# Patient Record
Sex: Female | Born: 1960 | Race: Black or African American | Hispanic: No | Smoking: Never smoker
Health system: Southern US, Community
[De-identification: ages and names within clinical notes are randomized; demographics above are authoritative.]

## PROBLEM LIST (undated history)

## (undated) DIAGNOSIS — A5901 Trichomonal vulvovaginitis: Secondary | ICD-10-CM

## (undated) DIAGNOSIS — R51 Headache: Secondary | ICD-10-CM

## (undated) DIAGNOSIS — Z8042 Family history of malignant neoplasm of prostate: Secondary | ICD-10-CM

## (undated) DIAGNOSIS — F3181 Bipolar II disorder: Secondary | ICD-10-CM

## (undated) DIAGNOSIS — I10 Essential (primary) hypertension: Secondary | ICD-10-CM

## (undated) DIAGNOSIS — M1710 Unilateral primary osteoarthritis, unspecified knee: Secondary | ICD-10-CM

## (undated) DIAGNOSIS — I214 Non-ST elevation (NSTEMI) myocardial infarction: Secondary | ICD-10-CM

## (undated) DIAGNOSIS — E669 Obesity, unspecified: Secondary | ICD-10-CM

## (undated) DIAGNOSIS — G4700 Insomnia, unspecified: Secondary | ICD-10-CM

## (undated) DIAGNOSIS — I499 Cardiac arrhythmia, unspecified: Secondary | ICD-10-CM

## (undated) DIAGNOSIS — E785 Hyperlipidemia, unspecified: Secondary | ICD-10-CM

## (undated) DIAGNOSIS — M722 Plantar fascial fibromatosis: Secondary | ICD-10-CM

## (undated) DIAGNOSIS — Z803 Family history of malignant neoplasm of breast: Secondary | ICD-10-CM

## (undated) DIAGNOSIS — G43909 Migraine, unspecified, not intractable, without status migrainosus: Secondary | ICD-10-CM

## (undated) DIAGNOSIS — F429 Obsessive-compulsive disorder, unspecified: Secondary | ICD-10-CM

## (undated) DIAGNOSIS — I712 Thoracic aortic aneurysm, without rupture: Secondary | ICD-10-CM

## (undated) DIAGNOSIS — F4310 Post-traumatic stress disorder, unspecified: Secondary | ICD-10-CM

## (undated) DIAGNOSIS — N83202 Unspecified ovarian cyst, left side: Secondary | ICD-10-CM

## (undated) DIAGNOSIS — D649 Anemia, unspecified: Secondary | ICD-10-CM

## (undated) DIAGNOSIS — I5181 Takotsubo syndrome: Secondary | ICD-10-CM

## (undated) DIAGNOSIS — F419 Anxiety disorder, unspecified: Secondary | ICD-10-CM

## (undated) HISTORY — DX: Obesity, unspecified: E66.9

## (undated) HISTORY — DX: Morbid (severe) obesity due to excess calories: E66.01

## (undated) HISTORY — DX: Anxiety disorder, unspecified: F41.9

## (undated) HISTORY — DX: Post-traumatic stress disorder, unspecified: F43.10

## (undated) HISTORY — DX: Trichomonal vulvovaginitis: A59.01

## (undated) HISTORY — DX: Family history of malignant neoplasm of breast: Z80.3

## (undated) HISTORY — DX: Plantar fascial fibromatosis: M72.2

## (undated) HISTORY — PX: FOOT SURGERY: SHX648

## (undated) HISTORY — DX: Obsessive-compulsive disorder, unspecified: F42.9

## (undated) HISTORY — DX: Family history of malignant neoplasm of prostate: Z80.42

## (undated) HISTORY — DX: Hyperlipidemia, unspecified: E78.5

## (undated) HISTORY — DX: Headache: R51

## (undated) HISTORY — DX: Insomnia, unspecified: G47.00

## (undated) HISTORY — DX: Bipolar II disorder: F31.81

## (undated) HISTORY — DX: Unilateral primary osteoarthritis, unspecified knee: M17.10

---

## 1987-03-26 HISTORY — PX: TUBAL LIGATION: SHX77

## 2008-10-28 ENCOUNTER — Other Ambulatory Visit: Admission: RE | Admit: 2008-10-28 | Discharge: 2008-10-28 | Payer: Self-pay | Admitting: Family Medicine

## 2009-01-13 ENCOUNTER — Ambulatory Visit (HOSPITAL_COMMUNITY): Payer: Self-pay | Admitting: Psychology

## 2009-02-01 ENCOUNTER — Inpatient Hospital Stay (HOSPITAL_COMMUNITY): Admission: AD | Admit: 2009-02-01 | Discharge: 2009-02-02 | Payer: Self-pay | Admitting: Psychiatry

## 2009-02-01 ENCOUNTER — Ambulatory Visit: Payer: Self-pay | Admitting: Psychiatry

## 2009-11-24 ENCOUNTER — Other Ambulatory Visit: Admission: RE | Admit: 2009-11-24 | Discharge: 2009-11-24 | Payer: Self-pay | Admitting: Family Medicine

## 2010-06-27 LAB — COMPREHENSIVE METABOLIC PANEL
Alkaline Phosphatase: 58 U/L (ref 39–117)
BUN: 9 mg/dL (ref 6–23)
Glucose, Bld: 101 mg/dL — ABNORMAL HIGH (ref 70–99)
Potassium: 2.8 mEq/L — ABNORMAL LOW (ref 3.5–5.1)
Total Bilirubin: 0.6 mg/dL (ref 0.3–1.2)
Total Protein: 7 g/dL (ref 6.0–8.3)

## 2010-06-27 LAB — CBC
HCT: 34.8 % — ABNORMAL LOW (ref 36.0–46.0)
Hemoglobin: 11.8 g/dL — ABNORMAL LOW (ref 12.0–15.0)
MCHC: 33.8 g/dL (ref 30.0–36.0)
RDW: 13.5 % (ref 11.5–15.5)

## 2010-06-27 LAB — TSH: TSH: 3.953 u[IU]/mL (ref 0.350–4.500)

## 2010-09-19 ENCOUNTER — Telehealth: Payer: Self-pay | Admitting: *Deleted

## 2010-09-19 NOTE — Telephone Encounter (Signed)
Error/ not correct dob

## 2011-05-24 HISTORY — PX: BRACHIOPLASTY: SUR162

## 2011-12-19 ENCOUNTER — Emergency Department (HOSPITAL_COMMUNITY): Payer: BC Managed Care – PPO

## 2011-12-19 ENCOUNTER — Emergency Department (HOSPITAL_COMMUNITY)
Admission: EM | Admit: 2011-12-19 | Discharge: 2011-12-19 | Disposition: A | Discharge status: Home / Self Care | Payer: BC Managed Care – PPO | Attending: Emergency Medicine | Admitting: Emergency Medicine

## 2011-12-19 ENCOUNTER — Encounter (HOSPITAL_COMMUNITY): Payer: Self-pay | Admitting: Emergency Medicine

## 2011-12-19 ENCOUNTER — Other Ambulatory Visit: Payer: Self-pay

## 2011-12-19 DIAGNOSIS — I10 Essential (primary) hypertension: Secondary | ICD-10-CM | POA: Insufficient documentation

## 2011-12-19 DIAGNOSIS — G43109 Migraine with aura, not intractable, without status migrainosus: Secondary | ICD-10-CM

## 2011-12-19 HISTORY — DX: Essential (primary) hypertension: I10

## 2011-12-19 LAB — CBC
HCT: 36.7 % (ref 36.0–46.0)
Platelets: 275 10*3/uL (ref 150–400)
RDW: 13.3 % (ref 11.5–15.5)
WBC: 4.8 10*3/uL (ref 4.0–10.5)

## 2011-12-19 LAB — PROTIME-INR
INR: 1 (ref 0.00–1.49)
Prothrombin Time: 13.1 seconds (ref 11.6–15.2)

## 2011-12-19 LAB — BASIC METABOLIC PANEL
Chloride: 100 mEq/L (ref 96–112)
GFR calc Af Amer: >90 mL/min (ref >90)
Potassium: 3 mEq/L — ABNORMAL LOW (ref 3.5–5.1)
Sodium: 134 mEq/L — ABNORMAL LOW (ref 135–145)

## 2011-12-19 MED ORDER — METOCLOPRAMIDE HCL 5 MG/ML IJ SOLN
10.0000 mg | Freq: Once | INTRAMUSCULAR | Status: AC
  Administered 2011-12-19: 10 mg via INTRAVENOUS
  Filled 2011-12-19: qty 2

## 2011-12-19 MED ORDER — HYDROCODONE-ACETAMINOPHEN 5-500 MG PO TABS: 1.0000 | ORAL_TABLET | Freq: Four times a day (QID) | ORAL | Status: DC | PRN

## 2011-12-19 MED ORDER — LORAZEPAM 2 MG/ML IJ SOLN
INTRAMUSCULAR | Status: AC
  Administered 2011-12-19: 2 mg via INTRAVENOUS
  Filled 2011-12-19: qty 1

## 2011-12-19 MED ORDER — ASPIRIN 81 MG PO CHEW: 81.0000 mg | CHEWABLE_TABLET | Freq: Every day | ORAL | Status: DC

## 2011-12-19 MED ORDER — METOCLOPRAMIDE HCL 5 MG/ML IJ SOLN
10.0000 mg | Freq: Once | INTRAMUSCULAR | Status: AC
  Administered 2011-12-19: 10 mg via INTRAVENOUS
  Filled 2011-12-19 (×2): qty 2

## 2011-12-19 MED ORDER — LORAZEPAM 2 MG/ML IJ SOLN
2.0000 mg | Freq: Once | INTRAMUSCULAR | Status: AC
  Administered 2011-12-19: 2 mg via INTRAVENOUS

## 2011-12-19 MED ORDER — HYDROMORPHONE HCL PF 1 MG/ML IJ SOLN
0.5000 mg | Freq: Once | INTRAMUSCULAR | Status: AC
  Administered 2011-12-19: 0.5 mg via INTRAVENOUS
  Filled 2011-12-19: qty 1

## 2011-12-19 NOTE — ED Notes (Signed)
Pt has no signs of respiratory distress, vital sign is stable. Has headache in the past but never had neurological deficit associated with it.

## 2011-12-19 NOTE — ED Notes (Signed)
Pt in MRI, MRI tech said pt was in panic attack, and crawling out of bed and how she never saw anybody like her before. Dr. Patria Mane aware, 2 mg ativan given.

## 2011-12-19 NOTE — ED Notes (Signed)
Pt has headache x 1 week, woke up feeling numb on left face and left side of body, couldn't talk as well. Pt speech is somewhat clear, symmetrical smile, Left arm is weaker with drift, Left leg is weaker with drift as well. Pt unable to distinguish sharp from dull (sts feels like something is tapping her but feels the same on bilateral legs and arms).

## 2011-12-19 NOTE — ED Notes (Signed)
Pt to CT

## 2011-12-19 NOTE — ED Notes (Signed)
Pt still in MRI 

## 2011-12-19 NOTE — ED Provider Notes (Signed)
History     CSN: [id number]  Arrival date & time 12/19/11  7829   First MD Initiated Contact with Patient 12/19/11 0930      Chief Complaint  Patient presents with  . Weakness     The history is provided by the patient and a relative.   patient reports a history of headaches and reports severe headache over the past week.  She seen her physician and was placed on prednisone and Vicodin but reports that her headache worsen.  She awoke this morning with left-sided facial numbness and slurred speech per the daughter at the bedside.  She also reported weakness of her bilateral lower extremities and generalized weakness with some weakness of her left upper extremity as well.  She has a history of stroke.  She does not smoke cigarettes.  She has a history of hypertension.  She denies diabetes and hyperlipidemia.  She's no chest pain or shortness of breath.  She's had no fevers or chills.  She has no neck stiffness.  Past Medical History  Diagnosis Date  . Hypertension     Past Surgical History  Procedure Date  . Arm sugery right    No family history on file.  History  Substance Use Topics  . Smoking status: Never Smoker   . Smokeless tobacco: Not on file  . Alcohol Use: No    OB History    Grav Para Term Preterm Abortions TAB SAB Ect Mult Living                  Review of Systems  Neurological: Positive for weakness.  All other systems reviewed and are negative.    Allergies  Review of patient's allergies indicates no known allergies.  Home Medications   Current Outpatient Rx  Name Route Sig Dispense Refill  . ESZOPICLONE 3 MG PO TABS Oral Take 3 mg by mouth at bedtime as needed. Take immediately before bedtime; for sleep.    Marland Kitchen HYDROCHLOROTHIAZIDE 25 MG PO TABS Oral Take 25 mg by mouth daily.    Marland Kitchen HYDROCODONE-ACETAMINOPHEN 5-500 MG PO TABS Oral Take 1-2 tablets by mouth every 6 (six) hours as needed. For pain.    Marland Kitchen PREDNISONE 20 MG PO TABS Oral Take 40 mg by mouth  daily.      BP 139/92  Pulse 65  Temp 98.8 F (37.1 C) (Oral)  Resp 17  SpO2 100%  Physical Exam  Nursing note and vitals reviewed. Constitutional: She appears well-developed and well-nourished. No distress.       Her eyes are clinched shut and she will not open them due to severity of photophobia  HENT:  Head: Normocephalic and atraumatic.  Eyes: Right eye exhibits no discharge. Left eye exhibits no discharge.  Neck: Normal range of motion. No rigidity.  Cardiovascular: Normal rate, regular rhythm and normal heart sounds.   Pulmonary/Chest: Effort normal and breath sounds normal.  Abdominal: Soft. She exhibits no distension. There is no tenderness.  Musculoskeletal: Normal range of motion.  Neurological: She is alert.       Patient with 5 out of 5 strength in right upper extremity.  Patient with 4-5 strength in left upper extremity.  Patient with 4/5 strength in bilateral lower extremities.  Her effort with her lower extremities is very poor and it seems as though she forcibly pushes her legs down towards the bed.  Her effort with her left upper extremity is intermittent and it seems that her strength waxes and wanes.  Skin: Skin is warm and dry.  Psychiatric: She has a normal mood and affect. Judgment normal.    ED Course  Procedures (including critical care time)  Labs Reviewed  BASIC METABOLIC PANEL - Abnormal; Notable for the following:    Sodium 134 (*)     Potassium 3.0 (*)     GFR calc non Af Amer 83 (*)     All other components within normal limits  CBC  PROTIME-INR   Ct Head Wo Contrast  12/19/2011  *RADIOLOGY REPORT*  Clinical Data:  Left-sided weakness.  Headache.  CT HEAD WITHOUT CONTRAST  Technique: Contiguous axial images were obtained from the base of the skull through the vertex without contrast  Comparison: None  Findings:  There is no evidence of intracranial hemorrhage, brain edema, or other signs of acute infarction.  There is no evidence of intracranial  mass lesion or mass effect.  No abnormal extraaxial fluid collections are identified.  There is no evidence of hydrocephalus, or other significant intracranial abnormality.  No skull abnormality identified.  IMPRESSION: Negative non-contrast head CT.   Original Report Authenticated By: Danae Orleans, M.D.    Mr Brain Wo Contrast  12/19/2011  *RADIOLOGY REPORT*  Clinical Data: Severe migraine headache which is improving.  Left- sided weakness.  MRI HEAD WITHOUT CONTRAST  Technique:  Multiplanar, multiecho pulse sequences of the brain and surrounding structures were obtained according to standard protocol without intravenous contrast.  Comparison: CT head earlier in the day  Findings: Subtle areas of focal linear and punctate restricted diffusion in the right frontal premotor cortex (images 23 - 26, series 4) display corresponding decreased signal on ADC mapping. No correlated abnormality on FLAIR or T2-weighted images.  No subarachnoid blood, hemorrhage, or mass lesion.  The findings are consistent with sequela of complicated migraine, which in all likelihood is reversible given the lack of prolonged T2 signal on other sequences.  Short-term follow-up is recommended, with pre and postcontrast imaging.  No acute cortically based infarct.  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  No midline shift.  Major intracranial vascular structures patent.  Slight prolonged DWI signal in the scalp overlying the right frontal region of uncertain significance; correlate with localized tenderness.  Normal pituitary and cerebellar tonsils.  Negative orbits, sinuses, mastoids.  IMPRESSION: Low level right hemisphere restricted diffusion could represent sequelae of complicated migraine.  Localized edema of the right frontal scalp may be a secondary sign.  The signal intensity on DWI sequence does not appear sufficiently intense to suggest acute infarction.  Recommend short term follow up as described above.   No evidence for subarachnoid blood, intra-axial mass lesion, or proximal vascular occlusion.   Original Report Authenticated By: Elsie Stain, M.D.     Date: 12/19/2011  Rate: 74  Rhythm: normal sinus rhythm  QRS Axis: normal  Intervals: normal  ST/T Wave abnormalities: normal  Conduction Disutrbances: none  Narrative Interpretation:   Old EKG Reviewed: No significant changes noted   I personally reviewed the imaging tests through PACS system I reviewed available ER/hospitalization records thought the EMR i discussed the MR with radiology   1. Complicated migraine       MDM  I believe this is a complicated migraine.  The patient received treatment for her headache at this time.  CT scan of her head pending to evaluate for mass/bleed.  As she awoke with her symptoms she would not be a candidate for TPA regardless of her presenting complaints.  However I believe that her effort is poor.   3:00 PM Feels much better. Weakness has nearly resolved. Able to ambulate to the restroom. Dc home. Will place on an 81 mg ASA. Neuro follow up        Lyanne Co, MD 12/19/11 1501

## 2011-12-19 NOTE — ED Notes (Signed)
Pt returned from MRI °

## 2011-12-19 NOTE — ED Notes (Signed)
Dr. Patria Mane informed of pt's condition and is in room evaluating pt.

## 2011-12-19 NOTE — ED Notes (Signed)
Pt presenting to ed with c/o waking up this morning with left sided facial numbness and slurred speech per daughter at bedside. Per daughter pt has had a headache x 1 week and her doctor put her on prednisone and vicodin and she has been feeling funny since pt's daughter states she went back to her doctor yesterday but woke up with worse symptoms this morning.

## 2011-12-21 ENCOUNTER — Inpatient Hospital Stay (HOSPITAL_COMMUNITY)
Admission: EM | Admit: 2011-12-21 | Discharge: 2011-12-24 | DRG: 024 | Disposition: A | Discharge status: Home / Self Care | Payer: BC Managed Care – PPO | Attending: Internal Medicine | Admitting: Internal Medicine

## 2011-12-21 ENCOUNTER — Encounter (HOSPITAL_COMMUNITY): Payer: Self-pay

## 2011-12-21 DIAGNOSIS — I10 Essential (primary) hypertension: Secondary | ICD-10-CM | POA: Diagnosis present

## 2011-12-21 DIAGNOSIS — E876 Hypokalemia: Secondary | ICD-10-CM | POA: Diagnosis present

## 2011-12-21 DIAGNOSIS — M6281 Muscle weakness (generalized): Secondary | ICD-10-CM

## 2011-12-21 DIAGNOSIS — R112 Nausea with vomiting, unspecified: Secondary | ICD-10-CM

## 2011-12-21 DIAGNOSIS — G8194 Hemiplegia, unspecified affecting left nondominant side: Secondary | ICD-10-CM

## 2011-12-21 DIAGNOSIS — Z7982 Long term (current) use of aspirin: Secondary | ICD-10-CM

## 2011-12-21 DIAGNOSIS — R51 Headache: Secondary | ICD-10-CM

## 2011-12-21 DIAGNOSIS — G8190 Hemiplegia, unspecified affecting unspecified side: Secondary | ICD-10-CM | POA: Diagnosis present

## 2011-12-21 DIAGNOSIS — Z79899 Other long term (current) drug therapy: Secondary | ICD-10-CM

## 2011-12-21 DIAGNOSIS — R531 Weakness: Secondary | ICD-10-CM

## 2011-12-21 DIAGNOSIS — R519 Headache, unspecified: Secondary | ICD-10-CM | POA: Diagnosis present

## 2011-12-21 DIAGNOSIS — G43109 Migraine with aura, not intractable, without status migrainosus: Principal | ICD-10-CM

## 2011-12-21 LAB — MAGNESIUM: Magnesium: 1.9 mg/dL (ref 1.5–2.5)

## 2011-12-21 MED ORDER — DEXAMETHASONE SODIUM PHOSPHATE 10 MG/ML IJ SOLN
10.0000 mg | Freq: Once | INTRAMUSCULAR | Status: AC
  Administered 2011-12-21: 10 mg via INTRAVENOUS
  Filled 2011-12-21: qty 1

## 2011-12-21 MED ORDER — SODIUM CHLORIDE 0.9 % IV BOLUS (SEPSIS)
1000.0000 mL | Freq: Once | INTRAVENOUS | Status: AC
  Administered 2011-12-21: 1000 mL via INTRAVENOUS

## 2011-12-21 MED ORDER — VALPROIC ACID 250 MG PO CAPS
500.0000 mg | ORAL_CAPSULE | Freq: Once | ORAL | Status: AC
  Administered 2011-12-21: 500 mg via ORAL
  Filled 2011-12-21: qty 2

## 2011-12-21 MED ORDER — METOCLOPRAMIDE HCL 5 MG/ML IJ SOLN
10.0000 mg | Freq: Once | INTRAMUSCULAR | Status: AC
  Administered 2011-12-21: 10 mg via INTRAVENOUS
  Filled 2011-12-21: qty 2

## 2011-12-21 MED ORDER — OXYCODONE HCL 5 MG PO TABS
5.0000 mg | ORAL_TABLET | ORAL | Status: DC | PRN
  Administered 2011-12-22 – 2011-12-24 (×7): 5 mg via ORAL
  Filled 2011-12-21 (×7): qty 1

## 2011-12-21 MED ORDER — ZOLPIDEM TARTRATE 5 MG PO TABS: 5.0000 mg | ORAL_TABLET | Freq: Every evening | ORAL | Status: DC | PRN

## 2011-12-21 MED ORDER — POTASSIUM CHLORIDE CRYS ER 20 MEQ PO TBCR
40.0000 meq | EXTENDED_RELEASE_TABLET | Freq: Once | ORAL | Status: AC
  Administered 2011-12-22: 40 meq via ORAL
  Filled 2011-12-21: qty 2

## 2011-12-21 MED ORDER — ONDANSETRON HCL 4 MG/2ML IJ SOLN
4.0000 mg | Freq: Four times a day (QID) | INTRAMUSCULAR | Status: DC | PRN
  Administered 2011-12-22 – 2011-12-24 (×5): 4 mg via INTRAVENOUS
  Filled 2011-12-21 (×5): qty 2

## 2011-12-21 MED ORDER — ALUM & MAG HYDROXIDE-SIMETH 200-200-20 MG/5ML PO SUSP
30.0000 mL | Freq: Four times a day (QID) | ORAL | Status: DC | PRN
  Administered 2011-12-23: 30 mL via ORAL
  Filled 2011-12-21: qty 30

## 2011-12-21 MED ORDER — ACETAMINOPHEN 325 MG PO TABS: 650.0000 mg | ORAL_TABLET | Freq: Four times a day (QID) | ORAL | Status: DC | PRN

## 2011-12-21 MED ORDER — ONDANSETRON HCL 4 MG PO TABS
4.0000 mg | ORAL_TABLET | Freq: Four times a day (QID) | ORAL | Status: DC | PRN
  Filled 2011-12-21: qty 1

## 2011-12-21 MED ORDER — KETOROLAC TROMETHAMINE 30 MG/ML IJ SOLN
30.0000 mg | Freq: Once | INTRAMUSCULAR | Status: AC
  Administered 2011-12-21: 30 mg via INTRAVENOUS
  Filled 2011-12-21: qty 1

## 2011-12-21 MED ORDER — VALPROATE SODIUM 500 MG/5ML IV SOLN
500.0000 mg | INTRAVENOUS | Status: AC
  Administered 2011-12-22: 500 mg via INTRAVENOUS
  Filled 2011-12-21: qty 5

## 2011-12-21 MED ORDER — ACETAMINOPHEN 650 MG RE SUPP: 650.0000 mg | Freq: Four times a day (QID) | RECTAL | Status: DC | PRN

## 2011-12-21 MED ORDER — PROCHLORPERAZINE EDISYLATE 5 MG/ML IJ SOLN
10.0000 mg | Freq: Once | INTRAMUSCULAR | Status: AC
  Administered 2011-12-21: 10 mg via INTRAVENOUS
  Filled 2011-12-21: qty 2

## 2011-12-21 MED ORDER — HYDROMORPHONE HCL PF 1 MG/ML IJ SOLN
1.0000 mg | INTRAMUSCULAR | Status: AC
  Administered 2011-12-21: 1 mg via INTRAVENOUS
  Filled 2011-12-21: qty 1

## 2011-12-21 MED ORDER — POTASSIUM CHLORIDE IN NACL 20-0.9 MEQ/L-% IV SOLN
INTRAVENOUS | Status: DC
  Administered 2011-12-22 – 2011-12-23 (×2): via INTRAVENOUS
  Filled 2011-12-21 (×8): qty 1000

## 2011-12-21 MED ORDER — LIDOCAINE HCL (PF) 1 % IJ SOLN
INTRAMUSCULAR | Status: AC
  Filled 2011-12-21: qty 5

## 2011-12-21 MED ORDER — HYDROMORPHONE HCL PF 1 MG/ML IJ SOLN
0.5000 mg | INTRAMUSCULAR | Status: DC | PRN
  Administered 2011-12-22 – 2011-12-23 (×3): 1 mg via INTRAVENOUS
  Filled 2011-12-21 (×3): qty 1

## 2011-12-21 MED ORDER — DIPHENHYDRAMINE HCL 50 MG/ML IJ SOLN
25.0000 mg | Freq: Once | INTRAMUSCULAR | Status: AC
  Administered 2011-12-21: 25 mg via INTRAVENOUS
  Filled 2011-12-21: qty 1

## 2011-12-21 NOTE — ED Notes (Signed)
History of Migraine seen at [facility] two days ago for the same.

## 2011-12-21 NOTE — ED Provider Notes (Signed)
4:35 PM Patient moved to CDU holding for treatment of migraine.  Sign out received from Deborah Helper, PA-C.  Pt with headache ongoing since last week, seen in ED on 9/26 with MRI brain showing no acute changes but possible evidence for complicated migraine.  Pt also with left sided weakness on exam, per Deborah Williamson.  Deborah Williamson has spoken with Dr Deborah Williamson and the on call neurologist who recommend treatment with migraine cocktail as well as valproic acid.  Plan is for pain relief.  If patient does not improve and/or continues to have left sided weakness, I will call neurology for an official consult.    6:00 PM Patient reports minor change with headache, pain is now 8-9/10.  Was previously "10 plus plus."  Pt reports she has had headache x 8 days, 5 days of left facial pain and left arm and leg weakness.  States her headache was initially her entire head and now involves only her left face.  Pt reports vicodin makes her go to sleep but she otherwise has not had any relief from the pain.   Patient is alert, though she hold her eyes shut tightly with light on.  Neck is supple.  Negative Brudzinski neck sign.  Pt gives poor effort for neurological exam:  However, from limited exam it appears CN II-XII grossly intact though patient states left V2, V3 decreased sensation, no pronator drift, grip strengths decreased on left; strength 5/5 in right extremities, 4/5 left extremities, sensation intact in all extremities; finger to nose, heel to shin, rapid alternating movements normal; gait is normal.   Pt states treatment given in ED earlier this week was more effective - she was given dilaudid and ativan.  I have ordered dilaudid 1mg  and recheck.  If pt not improving, will request neurology consult.    7:22 PM I spoke with Dr Deborah Williamson, neuro hospitalist, who will see the patient.    8:03 PM Dr Deborah Williamson has seen patient.  Will order medications for headache.  Recommends no narcotics for this patient.  She will perform lumbar  puncture in ED.  9:11 PM Dr Deborah Williamson unable to get LP given patient's body habitus.  Patient admitted to [facility] overnight, plan for  IR to do LP in the morning.  Dr Deborah Williamson has written consult note and put in orders for medications.  I have spoken with Dr Deborah Williamson of [facility] who will admit the patient.  I have put in holding orders and request for med-surg bed.   Results for orders placed during the hospital encounter of 12/19/11  CBC      Component Value Range   WBC 4.8  4.0 - 10.5 K/uL   RBC 4.54  3.87 - 5.11 MIL/uL   Hemoglobin 12.3  12.0 - 15.0 g/dL   HCT 16.1  09.6 - 04.5 %   MCV 80.8  78.0 - 100.0 fL   MCH 27.1  26.0 - 34.0 pg   MCHC 33.5  30.0 - 36.0 g/dL   RDW 40.9  81.1 - 91.4 %   Platelets 275  150 - 400 K/uL  BASIC METABOLIC PANEL      Component Value Range   Sodium 134 (*) 135 - 145 mEq/L   Potassium 3.0 (*) 3.5 - 5.1 mEq/L   Chloride 100  96 - 112 mEq/L   CO2 26  19 - 32 mEq/L   Glucose, Bld 93  70 - 99 mg/dL   BUN 13  6 - 23 mg/dL   Creatinine, Ser  0.82  0.50 - 1.10 mg/dL   Calcium 8.7  8.4 - 16.1 mg/dL   GFR calc non Af Amer 83 (*) >90 mL/min   GFR calc Af Amer >90  >90 mL/min  PROTIME-INR      Component Value Range   Prothrombin Time 13.1  11.6 - 15.2 seconds   INR 1.00  0.00 - 1.49   Ct Head Wo Contrast  12/19/2011  *RADIOLOGY REPORT*  Clinical Data:  Left-sided weakness.  Headache.  CT HEAD WITHOUT CONTRAST  Technique: Contiguous axial images were obtained from the base of the skull through the vertex without contrast  Comparison: None  Findings:  There is no evidence of intracranial hemorrhage, brain edema, or other signs of acute infarction.  There is no evidence of intracranial mass lesion or mass effect.  No abnormal extraaxial fluid collections are identified.  There is no evidence of hydrocephalus, or other significant intracranial abnormality.  No skull abnormality identified.  IMPRESSION: Negative non-contrast head CT.   Original Report  Authenticated By: Danae Orleans, M.D.    Mr Brain Wo Contrast  12/19/2011  *RADIOLOGY REPORT*  Clinical Data: Severe migraine headache which is improving.  Left- sided weakness.  MRI HEAD WITHOUT CONTRAST  Technique:  Multiplanar, multiecho pulse sequences of the brain and surrounding structures were obtained according to standard protocol without intravenous contrast.  Comparison: CT head earlier in the day  Findings: Subtle areas of focal linear and punctate restricted diffusion in the right frontal premotor cortex (images 23 - 26, series 4) display corresponding decreased signal on ADC mapping. No correlated abnormality on FLAIR or T2-weighted images.  No subarachnoid blood, hemorrhage, or mass lesion.  The findings are consistent with sequela of complicated migraine, which in all likelihood is reversible given the lack of prolonged T2 signal on other sequences.  Short-term follow-up is recommended, with pre and postcontrast imaging.  No acute cortically based infarct.  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  No midline shift.  Major intracranial vascular structures patent.  Slight prolonged DWI signal in the scalp overlying the right frontal region of uncertain significance; correlate with localized tenderness.  Normal pituitary and cerebellar tonsils.  Negative orbits, sinuses, mastoids.  IMPRESSION: Low level right hemisphere restricted diffusion could represent sequelae of complicated migraine.  Localized edema of the right frontal scalp may be a secondary sign.  The signal intensity on DWI sequence does not appear sufficiently intense to suggest acute infarction.  Recommend short term follow up as described above.  No evidence for subarachnoid blood, intra-axial mass lesion, or proximal vascular occlusion.   Original Report Authenticated By: Elsie Stain, M.D.       [place], [place] 12/21/11 2113

## 2011-12-21 NOTE — ED Notes (Signed)
Pt here with headache ongoing since last week, seen at pmd and er at [facility] long for same and sts treatment is not helping. Pt complains of mouth pain and left side of face is hurting also.

## 2011-12-21 NOTE — H&P (Addendum)
[facility] History and Physical  Shaquia Berkley ZOX:[id number] DOB: [birth year] DOA: 12/21/2011  Referring physician: EDP PCP: Ricci Barker, PA   Chief Complaint: Unremitting Headache and left Sided Weakness  HPI: Deborah Williamson is a 51 y.o. female who presents to the ED with complaints of an Intractable frontal headache for the past 8 days.  She reports having low grade fevers and chills and 4 days ago she began to have left sided weakness and difficulty speaking.  She was referred to the ED by her PCP and at that time and had an evaluation which included an MRI of the brain.  The MRI  On 09/26 revealed changes that were reported as consistent with a complex migraine.  She was rendered therapy for an Intractable Migraine headache and discharged home on Vicodin, and Prednisone therapy but she could not tolerate the prednisone, it made her feel sick so her doctor advised her to stop the prednisone.  She returned to the ED today due to continued and worsening of her symptoms, 10 /10 pain, with increased headache, nausea and vomiting and photophobia.  [name] denies having any previous history of migraines, she denies any recent travel, and she denies having any tick bites or exposures.  Also she reports having a previous history of hypertension and had been on medications but after weight loss she was able to discontinue he medications, 2 years ago and her blood pressures had been under control until her recent headache.  Her doctor put her back on medication due to the elevation in her blood pressure, and she was placed on HCTZ.  She also reports having 2 nosebleeds over the past 2 days as well.    In the ED she was evaluated by the EDP who consulted Neurology, and she was seen by Dr. Thad Ranger.  An LP was attempted without success.  Patient was referred for medical admission for further therapy and for an LP to be performed by radiology under fluoroscopy.     Review of Systems: The patient  denies anorexia, weight loss, vision loss, decreased hearing, hoarseness, chest pain, syncope, dyspnea on exertion, peripheral edema, balance deficits, hemoptysis, abdominal pain, melena, hematochezia, severe indigestion/heartburn, hematuria, incontinence, genital sores, suspicious skin lesions, transient blindness, difficulty walking, depression, unusual weight change, abnormal bleeding, enlarged lymph nodes, angioedema, and breast masses.    Past Medical History  Diagnosis Date  . Hypertension    Past Surgical History  Procedure Date  . Arm sugery Both     Plastic Surgery to remove loose skin after Weight Loss    Medications:  HOME MEDS: Prior to Admission medications   Medication Sig Start Date End Date Taking? Authorizing Provider  aspirin 81 MG chewable tablet Chew 1 tablet (81 mg total) by mouth daily. 12/19/11  Yes Lyanne Co, MD  Eszopiclone (ESZOPICLONE) 3 MG TABS Take 3 mg by mouth at bedtime as needed. Take immediately before bedtime; for sleep.   Yes Historical Provider, MD  hydrochlorothiazide (HYDRODIURIL) 25 MG tablet Take 25 mg by mouth daily.   Yes Historical Provider, MD  HYDROcodone-acetaminophen (VICODIN) 5-500 MG per tablet Take 1-2 tablets by mouth every 6 (six) hours as needed. For pain.   Yes Historical Provider, MD  predniSONE (DELTASONE) 20 MG tablet Take 40 mg by mouth daily.   Yes Historical Provider, MD      No Known Allergies   Social History:  reports that she has never smoked. She does not have any smokeless tobacco history on file. She reports  that she does not drink alcohol or use illicit drugs.    Family History  Problem Relation Age of Onset  . Hypertension Mother   . Diabetes Paternal Grandmother   . Breast cancer Mother   . Breast cancer Maternal Aunt   . Prostate cancer Maternal Uncle   . Prostate cancer Maternal Uncle   . Stroke Paternal Grandmother      Physical Exam:  GEN:  Pleasant 51 year old well nourished and well  developed African American Female examined  and in no acute distress; cooperative with exam Filed Vitals:   12/21/11 1255 12/21/11 1832  BP: 152/90 146/80  Pulse: 65 60  Temp: 98.2 F (36.8 C)   TempSrc: Oral   Resp: 18 18  SpO2: 100% 100%   Blood pressure 146/80, pulse 60, temperature 98.2 F (36.8 C), temperature source Oral, resp. rate 18, last menstrual period 12/10/2011, SpO2 100.00%. PSYCH: She is alert and oriented x4; does not appear anxious does not appear depressed; affect is normal HEENT: Normocephalic and Atraumatic, Mucous membranes pink; PERRLA; EOM intact; Fundi:  Benign;  No scleral icterus, Nares: Patent, Oropharynx: Clear, Fair Dentition, Neck:  FROM, no cervical lymphadenopathy nor thyromegaly or carotid bruit; no JVD; Breasts:: Not examined CHEST WALL: No tenderness CHEST: Normal respiration, clear to auscultation bilaterally HEART: Regular rate and rhythm; no murmurs rubs or gallops BACK: No kyphosis or scoliosis; no CVA tenderness ABDOMEN: Positive Bowel Sounds,  soft non-tender; no masses, no organomegaly, no pannus; no intertriginous candida. Rectal Exam: Not done EXTREMITIES: No bone or joint deformity; age-appropriate arthropathy of the hands and knees; no cyanosis, clubbing or edema; no ulcerations. Genitalia: not examined PULSES: 2+ and symmetric SKIN: Normal hydration no rash or ulceration CNS: Cranial nerves 2-12 grossly intact,   Speech is clear, NO Facial Droop,  No Pronator drift,  FROM,  4/5 Strength in LUE and LLE, Sensation mildly decreased LUE and LLE,  DTRs 2+/4, Cerebellar Fxn Intact.  Gait was not Assessed.      Labs on Admission:  Basic Metabolic Panel:  Lab 12/19/11 1610  NA 134*  K 3.0*  CL 100  CO2 26  GLUCOSE 93  BUN 13  CREATININE 0.82  CALCIUM 8.7  MG --  PHOS --   Liver Function Tests: No results found for this basename: AST:5,ALT:5,ALKPHOS:5,BILITOT:5,PROT:5,ALBUMIN:5 in the last 168 hours No results found for this  basename: LIPASE:5,AMYLASE:5 in the last 168 hours No results found for this basename: AMMONIA:5 in the last 168 hours CBC:  Lab 12/19/11 0945  WBC 4.8  NEUTROABS --  HGB 12.3  HCT 36.7  MCV 80.8  PLT 275   Cardiac Enzymes: No results found for this basename: CKTOTAL:5,CKMB:5,CKMBINDEX:5,TROPONINI:5 in the last 168 hours  BNP (last 3 results) No results found for this basename: PROBNP:3 in the last 8760 hours CBG: No results found for this basename: GLUCAP:5 in the last 168 hours  Radiological Exams on Admission: No results found.  EKG: Independently reviewed. Normal Sinus Rhythm No Acute changes.    Assessment: Principal Problem:  *Left hemiparesis Active Problems:  Intractable headache  Hypertension  Hypokalemia  Nausea and vomiting   Plan:    Seen by Neurology in ED Neuro Checks LP under Fluoroscopy by Radiology   For cultures to evaluate for atypical meningitis Repeat MRI Brain in AM  Pain Control with IV Dilaudid IVFs Anti-Emetics PRN Replete K+, check Magnesium  SCDs for DVT Prophylaxis    Code Status: FULL CODE Family Communication: Sister at Bedside Disposition  Plan: Return to Home  Time spent: 60 minutes  Ron Parker [facility] Pager [phone]  If 7PM-7AM, please contact night-coverage [email or web address] Password [facility] 12/21/2011, 10:05 PM

## 2011-12-21 NOTE — Consult Note (Signed)
Reason for Consult:Headache Referring Physician: Juleen China  CC: Headache with left sided weakness  HPI: Deborah Williamson is an 51 y.o. female who reports no significant history of headache.  Reports that on Friday she awakened with a severe holocranial headache and a nosebleed.  This continued over the weekend and she eventually went to her physician on Monday when she developed fever as well.   Her physician found her BP to be elevated and gave her an antihypertensive, Prednisone and Vicodin.  This did not help her headaches but the nosebleed did go away.  On Thursday her headache became more left sided and was accompanied by left sided numbness.  She found it difficult to ambulate and was having difficulty speaking.  She was seen at [facility] ED.  She reports that on presentation her headache was a 10+/10 and by discharge was a 8-9/10.  Her ambulation had improved and her speech was back to baseline.  She was told her headache would get better over the next 24 hours but it did not.  MRI of the brain was performed and showed changes consistent with migraine.  Patient presents today for headache that has continued and left sided numbness that has continued as well.   Headache is associated with nausea and photophobia.  She also reports blurred vision, particularly from the left eye.       Past Medical History  Diagnosis Date  . Hypertension     Past Surgical History  Procedure Date  . Arm sugery right    No family history on file.  Social History:  reports that she has never smoked. She does not have any smokeless tobacco history on file. She reports that she does not drink alcohol or use illicit drugs. She is widowed and works for [employer] .  No Known Allergies  Medications: I have reviewed the patient's current medications. Prior to Admission:  Current outpatient prescriptions:aspirin 81 MG chewable tablet, Chew 1 tablet (81 mg total) by mouth daily., Disp: 30 tablet, Rfl: 0;  Eszopiclone (ESZOPICLONE) 3  MG TABS, Take 3 mg by mouth at bedtime as needed. Take immediately before bedtime; for sleep., Disp: , Rfl: ;  hydrochlorothiazide (HYDRODIURIL) 25 MG tablet, Take 25 mg by mouth daily., Disp: , Rfl:  HYDROcodone-acetaminophen (VICODIN) 5-500 MG per tablet, Take 1-2 tablets by mouth every 6 (six) hours as needed. For pain., Disp: , Rfl:   ROS: History obtained from the patient  General ROS: difficulty with sleep Psychological ROS: negative for - behavioral disorder, hallucinations, memory difficulties, mood swings or suicidal ideation Ophthalmic ROS: as noted in HPI ENT ROS: as noted in HPI Allergy and Immunology ROS: negative for - hives or itchy/watery eyes Hematological and Lymphatic ROS: negative for - bleeding problems, bruising or swollen lymph nodes Endocrine ROS: night sweats Respiratory ROS: negative for - cough, hemoptysis, shortness of breath or wheezing Cardiovascular ROS: negative for - chest pain, dyspnea on exertion, edema or irregular heartbeat Gastrointestinal ROS: as noted in HPI Genito-Urinary ROS: negative for - dysuria, hematuria, incontinence or urinary frequency/urgency Musculoskeletal ROS: negative for - joint swelling or muscular weakness Neurological ROS: as noted in HPI Dermatological ROS: negative for rash and skin lesion changes  Physical Examination: Blood pressure 146/80, pulse 60, temperature 98.2 F (36.8 C), temperature source Oral, resp. rate 18, last menstrual period 12/10/2011, SpO2 100.00%.  Neurologic Examination Mental Status: Alert, oriented, thought content appropriate.  Speech fluent without evidence of aphasia.  Able to follow 3 step commands without difficulty. Cranial Nerves: II:  Discs flat on the right and difficult to visualize on the left; Visual fields grossly normal with some restriction in superior visual field bilaterally, pupils equal, round, reactive to light and accommodation III,IV, VI: ptosis not present, extra-ocular motions  intact bilaterally V,VII: smile symmetric, facial light touch and pinprick sensation decreased on the left splitting the midline VIII: hearing normal bilaterally IX,X: gag reflex present XI: bilateral shoulder shrug XII: midline tongue extension Motor: Right : Upper extremity   5/5    Left:     Upper extremity   5/5  Lower extremity   5/5     Lower extremity   5/5 Tone and bulk:normal tone throughout; no atrophy noted. No drift Sensory: Pinprick and light touch intact decreased on the left including the trunk and splitting the midline Deep Tendon Reflexes: 2+ and symmetric with 1+ AJ's bilaterally Plantars: Right: mute   Left: mute Cerebellar: normal finger-to-nose, normal rapid alternating movements and normal heel-to-shin test Gait: not tested CV: pulses palpable throughout   Laboratory Studies:   Basic Metabolic Panel:  Lab 12/19/11 4098  NA 134*  K 3.0*  CL 100  CO2 26  GLUCOSE 93  BUN 13  CREATININE 0.82  CALCIUM 8.7  MG --  PHOS --    Liver Function Tests: No results found for this basename: AST:5,ALT:5,ALKPHOS:5,BILITOT:5,PROT:5,ALBUMIN:5 in the last 168 hours No results found for this basename: LIPASE:5,AMYLASE:5 in the last 168 hours No results found for this basename: AMMONIA:3 in the last 168 hours  CBC:  Lab 12/19/11 0945  WBC 4.8  NEUTROABS --  HGB 12.3  HCT 36.7  MCV 80.8  PLT 275    Cardiac Enzymes: No results found for this basename: CKTOTAL:5,CKMB:5,CKMBINDEX:5,TROPONINI:5 in the last 168 hours  BNP: No components found with this basename: POCBNP:5  CBG: No results found for this basename: GLUCAP:5 in the last 168 hours  Microbiology: No results found for this or any previous visit.  Coagulation Studies:  Basename 12/19/11 0945  LABPROT 13.1  INR 1.00    Urinalysis: No results found for this basename:  COLORURINE:2,APPERANCEUR:2,LABSPEC:2,PHURINE:2,GLUCOSEU:2,HGBUR:2,BILIRUBINUR:2,KETONESUR:2,PROTEINUR:2,UROBILINOGEN:2,NITRITE:2,LEUKOCYTESUR:2 in the last 168 hours  Lipid Panel:  No results found for this basename: chol, trig, hdl, cholhdl, vldl, ldlcalc    HgbA1C:  No results found for this basename: HGBA1C    Urine Drug Screen:   No results found for this basename: labopia, cocainscrnur, labbenz, amphetmu, thcu, labbarb    Alcohol Level: No results found for this basename: ETH:2 in the last 168 hours   Imaging: No results found.   Assessment/Plan:  51 yo female with headache.  Features consistent with migraine but patient without migraine history.  Has had fevers.  Has an abnormal MRI and is complaining of left sided weakness and visual blurring.  LP indicated for full work.  Would rule out atypical meningitis (fungal, etc., lyme) and need opening pressure.  LP attempted by me in the ED after consent signed and procedure explained but unable to obtain.  Will need to be performed under fluoroscopy.    Recommendations: 1.  LP with opening pressure, lyme titer, fungal titers, HSV titer, cell count, diff, protein and glucose 2.  Repeat MRI of the brain with and without contrast 3.  Serum lyme titer, ESR, CRP 4.  Depacon IV and Compazine for headache abortion.  May need repeat Depacon if headache continues.   5.  Patient to have LP. Please hold all anticoagulants including DVT prophylaxis.    Thana Farr, MD [facility] [phone] 12/21/2011, 8:11 PM

## 2011-12-21 NOTE — ED Notes (Signed)
Report called to [facility] RN patient moved to CDU #4 via strecher

## 2011-12-21 NOTE — ED Notes (Signed)
Pt. Given gingerale.

## 2011-12-21 NOTE — ED Provider Notes (Signed)
History     CSN: [id number]  Arrival date & time 12/21/11  1246   First MD Initiated Contact with Patient 12/21/11 1352      Chief Complaint  Patient presents with  . Headache    (Consider location/radiation/quality/duration/timing/severity/associated sxs/prior treatment) HPI  51 year old female presents complaining of headache. Report a gradual onset of L sided headache ongoing for 1 weeks and is getting worse.  Headache is throbbing, constant, with associated tingling sensation to L face, neck, L arms and legs.  She reports L sided weakness.  She also endorse phonophobia and photophobia, along and nausea and vomiting.  Patient was seen in the ER 3 days ago for similar complaint. She was diagnosed with complicated migraine. During her recent visit, she had a head CT scan and brain MRI which shows no acute finding.  Pt sts she went home, took medications that were prescribed including asa, and vicodin but sxs worsen.  Denies neck stiffness, but c/o pain to her L side of neck.  Denies rash, or abdominal pain. No tick exposure.    No prior hx of headache.     Past Medical History  Diagnosis Date  . Hypertension     Past Surgical History  Procedure Date  . Arm sugery right    No family history on file.  History  Substance Use Topics  . Smoking status: Never Smoker   . Smokeless tobacco: Not on file  . Alcohol Use: No    OB History    Grav Para Term Preterm Abortions TAB SAB Ect Mult Living                  Review of Systems  All other systems reviewed and are negative.    Allergies  Review of patient's allergies indicates no known allergies.  Home Medications   Current Outpatient Rx  Name Route Sig Dispense Refill  . ASPIRIN 81 MG PO CHEW Oral Chew 1 tablet (81 mg total) by mouth daily. 30 tablet 0  . ESZOPICLONE 3 MG PO TABS Oral Take 3 mg by mouth at bedtime as needed. Take immediately before bedtime; for sleep.    Marland Kitchen HYDROCHLOROTHIAZIDE 25 MG PO TABS Oral  Take 25 mg by mouth daily.    Marland Kitchen HYDROCODONE-ACETAMINOPHEN 5-500 MG PO TABS Oral Take 1-2 tablets by mouth every 6 (six) hours as needed. For pain.    Marland Kitchen HYDROCODONE-ACETAMINOPHEN 5-500 MG PO TABS Oral Take 1-2 tablets by mouth every 6 (six) hours as needed for pain. 15 tablet 0  . PREDNISONE 20 MG PO TABS Oral Take 40 mg by mouth daily.      BP 152/90  Pulse 65  Temp 98.2 F (36.8 C) (Oral)  Resp 18  SpO2 100%  LMP 12/10/2011  Physical Exam  Nursing note and vitals reviewed. Constitutional: She appears well-developed and well-nourished. She appears distressed.       Awake, alert, nontoxic appearance. Uncomfortable appearing, wearing sunglasses and kept eyes closed.  HENT:  Head: Atraumatic.  Mouth/Throat: Oropharynx is clear and moist.  Eyes: Conjunctivae normal are normal. Right eye exhibits no discharge. Left eye exhibits no discharge.  Neck: Neck supple.       Tenderness along L lateral aspect of neck on palpation, no overlying skin changes.  Cardiovascular: Normal rate and regular rhythm.  Exam reveals no gallop and no friction rub.   No murmur heard. Pulmonary/Chest: Effort normal. No respiratory distress. She exhibits no tenderness.  Abdominal: Soft. There is no tenderness. There is  no rebound.  Musculoskeletal: She exhibits no tenderness.       ROM appears intact, no obvious focal weakness  Neurological: She is alert. A sensory deficit is present. No cranial nerve deficit. Coordination abnormal. GCS eye subscore is 3. GCS verbal subscore is 5. GCS motor subscore is 6.  Reflex Scores:      Patellar reflexes are 2+ on the right side and 2+ on the left side.      L sided weakness with 5/5 strength to RUE, and RLE, but 4/5 strength to LUE, and LLE.  Patella DTR 2+.  No foot drop.   Skin: No rash noted.  Psychiatric: She has a normal mood and affect.    ED Course  Procedures (including critical care time)  Labs Reviewed - No data to display No results found.   No  diagnosis found.  1. Complicated migraine  MDM  L sided headache with L sided weakness.  Recently diagnosed complicated migraine (2-3 days ago) with recent head CT and brain MRI showing no acute findings.    Will give migraine cocktail and will continue to monitor.  I also curbside consult with our neurologist who recommend adding valproic acid 500mg  via IV with migraine cocktail.  Will continue to monitor.  Discussed care with my attending.   3:44 PM Pt to move to CDU for further management of her headache and L sided weakness.  If sxs not improved with treatment, pt may need neuro consult.  Dr. Bernette Mayers is aware of plan.  Trixie Dredge, PA-C will continue care.  BP 152/90  Pulse 65  Temp 98.2 F (36.8 C) (Oral)  Resp 18  SpO2 100%  LMP 12/10/2011  Nursing notes reviewed and considered in documentation  Previous records reviewed and considered  Vitals reviewed and considered  Previoius xrays reviewed and considered   Fayrene Helper, PA-C 12/21/11 1554

## 2011-12-22 ENCOUNTER — Inpatient Hospital Stay (HOSPITAL_COMMUNITY): Payer: BC Managed Care – PPO

## 2011-12-22 LAB — GRAM STAIN

## 2011-12-22 LAB — CBC
HCT: 35.7 % — ABNORMAL LOW (ref 36.0–46.0)
MCH: 26.9 pg (ref 26.0–34.0)
MCV: 80.8 fL (ref 78.0–100.0)
Platelets: DECREASED 10*3/uL (ref 150–400)
RDW: 12.8 % (ref 11.5–15.5)
WBC: 5.1 10*3/uL (ref 4.0–10.5)

## 2011-12-22 LAB — CSF CELL COUNT WITH DIFFERENTIAL
RBC Count, CSF: 10 /mm3 — ABNORMAL HIGH
Tube #: 3

## 2011-12-22 LAB — BASIC METABOLIC PANEL
BUN: 14 mg/dL (ref 6–23)
CO2: 25 mEq/L (ref 19–32)
Calcium: 9 mg/dL (ref 8.4–10.5)
Chloride: 101 mEq/L (ref 96–112)
Creatinine, Ser: 0.7 mg/dL (ref 0.50–1.10)
Glucose, Bld: 101 mg/dL — ABNORMAL HIGH (ref 70–99)

## 2011-12-22 LAB — PROTEIN AND GLUCOSE, CSF: Glucose, CSF: 58 mg/dL (ref 43–76)

## 2011-12-22 MED ORDER — LORAZEPAM 2 MG/ML IJ SOLN
1.0000 mg | Freq: Once | INTRAMUSCULAR | Status: AC
  Administered 2011-12-22: 1 mg via INTRAVENOUS

## 2011-12-22 MED ORDER — GADOBENATE DIMEGLUMINE 529 MG/ML IV SOLN
15.0000 mL | Freq: Once | INTRAVENOUS | Status: AC | PRN
  Administered 2011-12-22: 15 mL via INTRAVENOUS

## 2011-12-22 MED ORDER — QUETIAPINE 12.5 MG HALF TABLET
12.5000 mg | ORAL_TABLET | Freq: Every day | ORAL | Status: DC
  Administered 2011-12-22 – 2011-12-23 (×2): 12.5 mg via ORAL
  Filled 2011-12-22 (×4): qty 1

## 2011-12-22 MED ORDER — WHITE PETROLATUM GEL
Status: AC
  Administered 2011-12-22: 01:00:00
  Filled 2011-12-22: qty 5

## 2011-12-22 MED ORDER — SODIUM CHLORIDE 0.9 % IV SOLN
INTRAVENOUS | Status: AC
  Administered 2011-12-22: 19:00:00 via INTRAVENOUS

## 2011-12-22 MED ORDER — LORAZEPAM 2 MG/ML IJ SOLN
INTRAMUSCULAR | Status: AC
  Filled 2011-12-22: qty 1

## 2011-12-22 MED ORDER — KETOROLAC TROMETHAMINE 30 MG/ML IJ SOLN
30.0000 mg | Freq: Three times a day (TID) | INTRAMUSCULAR | Status: DC | PRN
  Administered 2011-12-23 – 2011-12-24 (×3): 30 mg via INTRAVENOUS
  Filled 2011-12-22 (×3): qty 1

## 2011-12-22 MED ORDER — SODIUM CHLORIDE 0.9 % IV SOLN: INTRAVENOUS | Status: AC

## 2011-12-22 MED ORDER — VALPROATE SODIUM 500 MG/5ML IV SOLN
500.0000 mg | Freq: Every day | INTRAVENOUS | Status: AC
  Administered 2011-12-22 – 2011-12-23 (×2): 500 mg via INTRAVENOUS
  Filled 2011-12-22 (×2): qty 5

## 2011-12-22 MED ORDER — DIPHENHYDRAMINE HCL 50 MG/ML IJ SOLN
12.5000 mg | Freq: Once | INTRAMUSCULAR | Status: DC
  Filled 2011-12-22: qty 1

## 2011-12-22 MED ORDER — DIPHENHYDRAMINE HCL 50 MG/ML IJ SOLN
25.0000 mg | Freq: Four times a day (QID) | INTRAMUSCULAR | Status: DC | PRN
  Administered 2011-12-22: 25 mg via INTRAVENOUS

## 2011-12-22 NOTE — ED Provider Notes (Signed)
Medical screening examination/treatment/procedure(s) were performed by non-physician practitioner and as supervising physician I was immediately available for consultation/collaboration.   Jerone Cudmore, MD 12/22/11 0946 

## 2011-12-22 NOTE — Progress Notes (Signed)
Subjective:   Patient admitted for headaches with low grade fever and chills and left sided weakness., photophobia, phonophobia, nausea and paresthesias in the neck and left hand.   LP attempts were made in the ED. She is going for LP fluoroscopy . Patient is c/o of headaches 10/10 and nausea, but no vomiting, left sided paresthesias, no diplopia but kaleidoscopic vision,  She has ice pack that is helping her, significant photophobic this morning.  She has ambulated to the bathroom this morning.   She has had headaches since she was 40,  Her mother and sister with Migraine headaches   Objective: Current vital signs: BP 119/68  Pulse 55  Temp 98 F (36.7 C) (Oral)  Resp 16  Ht 5' 0.6" (1.539 m)  Wt 99.338 kg (219 lb)  BMI 41.93 kg/m2  SpO2 100%  LMP 12/10/2011 Vital signs in last 24 hours: Temp:  [98 F (36.7 C)-98.3 F (36.8 C)] 98 F (36.7 C) (09/29 0600) Pulse Rate:  [55-65] 55  (09/29 0600) Resp:  [16-18] 16  (09/29 0600) BP: (117-152)/(65-90) 119/68 mmHg (09/29 0600) SpO2:  [99 %-100 %] 100 % (09/29 0600) Weight:  [99.338 kg (219 lb)] 99.338 kg (219 lb) (09/29 0500)  Intake/Output from previous day:   Intake/Output this shift:   Nutritional status:    Neurologic Exam: Mental Status: Alert, oriented, thought content appropriate.  Speech fluent without evidence of aphasia.  Able to follow 3 step commands without difficulty. Cranial Nerves: II: visual fields grossly normal, pupils equal, round, reactive to light and accommodation III,IV, VI: ptosis not present, extra-ocular motions intact bilaterally V,VII: smile symmetric, facial light touch sensation normal bilaterally VIII: hearing normal bilaterally IX,X: gag reflex present XI: trapezius strength/neck flexion strength normal bilaterally XII: tongue strength normal  Motor: Right : Upper extremity   5/5    Left:     Upper extremity   5-/5  Lower extremity   5/5     Lower extremity   5/5 Tone and bulk:normal tone  throughout; no atrophy noted Sensory: Pinprick and light touch intact throughout, bilaterally Deep Tendon Reflexes: 2+ and symmetric throughout Plantars: Right: downgoing   Left: downgoing Cerebellar: normal finger-to-nose, normal rapid alternating movements and normal heel-to-shin test normal gait and station    Lab Results: Results for orders placed during the hospital encounter of 12/21/11 (from the past 48 hour(s))  SEDIMENTATION RATE     Status: Normal   Collection Time   12/21/11 10:00 PM      Component Value Range Comment   Sed Rate 12  0 - 22 mm/hr   MAGNESIUM     Status: Normal   Collection Time   12/21/11 10:00 PM      Component Value Range Comment   Magnesium 1.9  1.5 - 2.5 mg/dL   BASIC METABOLIC PANEL     Status: Abnormal   Collection Time   12/22/11  5:18 AM      Component Value Range Comment   Sodium 136  135 - 145 mEq/L    Potassium 4.0  3.5 - 5.1 mEq/L    Chloride 101  96 - 112 mEq/L    CO2 25  19 - 32 mEq/L    Glucose, Bld 101 (*) 70 - 99 mg/dL    BUN 14  6 - 23 mg/dL    Creatinine, Ser 5.64  0.50 - 1.10 mg/dL    Calcium 9.0  8.4 - 33.2 mg/dL    GFR calc non Af Amer >90  >  90 mL/min    GFR calc Af Amer >90  >90 mL/min   CBC     Status: Abnormal   Collection Time   12/22/11  5:18 AM      Component Value Range Comment   WBC 5.1  4.0 - 10.5 K/uL    RBC 4.42  3.87 - 5.11 MIL/uL    Hemoglobin 11.9 (*) 12.0 - 15.0 g/dL    HCT 40.9 (*) 81.1 - 46.0 %    MCV 80.8  78.0 - 100.0 fL    MCH 26.9  26.0 - 34.0 pg    MCHC 33.3  30.0 - 36.0 g/dL    RDW 91.4  78.2 - 95.6 %    Platelets    150 - 400 K/uL    Value: PLATELET CLUMPS NOTED ON SMEAR, COUNT APPEARS DECREASED    No results found for this or any previous visit (from the past 240 hour(s)).  Lipid Panel No results found for this basename: CHOL,TRIG,HDL,CHOLHDL,VLDL,LDLCALC in the last 72 hours  Studies/Results: No results found.  Medications:  Prior to Admission:  Prescriptions prior to admission    Medication Sig Dispense Refill  . aspirin 81 MG chewable tablet Chew 1 tablet (81 mg total) by mouth daily.  30 tablet  0  . Eszopiclone (ESZOPICLONE) 3 MG TABS Take 3 mg by mouth at bedtime as needed. Take immediately before bedtime; for sleep.      . hydrochlorothiazide (HYDRODIURIL) 25 MG tablet Take 25 mg by mouth daily.      Marland Kitchen HYDROcodone-acetaminophen (VICODIN) 5-500 MG per tablet Take 1-2 tablets by mouth every 6 (six) hours as needed. For pain.      . predniSONE (DELTASONE) 20 MG tablet Take 40 mg by mouth daily.       Scheduled:   . sodium chloride   Intravenous STAT  . dexamethasone  10 mg Intravenous Once  . diphenhydrAMINE  12.5 mg Intravenous Once  . diphenhydrAMINE  25 mg Intravenous Once  .  HYDROmorphone (DILAUDID) injection  1 mg Intravenous NOW  . ketorolac  30 mg Intravenous Once  . lidocaine      . metoCLOPramide (REGLAN) injection  10 mg Intravenous Once  . potassium chloride  40 mEq Oral Once  . prochlorperazine  10 mg Intravenous Once  . QUEtiapine  12.5 mg Oral QHS  . sodium chloride  1,000 mL Intravenous Once  . valproate sodium  500 mg Intravenous To ER  . valproic acid  500 mg Oral Once  . white petrolatum        Assessment/Plan:  Patient with intractable headaches. 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  Recommendations: 1) c/W Valproic acid 2) Add Toradol  30 mg IV tid 3) Phenergan 4) Beanadryl 5) Seroquel at bed time.    Tyia Binford V-P Eilleen Kempf., MD., Ph.D.,MS 12/22/2011 9:15 AM

## 2011-12-22 NOTE — ED Provider Notes (Signed)
Medical screening examination/treatment/procedure(s) were performed by non-physician practitioner and as supervising physician I was immediately available for consultation/collaboration.   Raeford Razor, MD 12/22/11 [phone]

## 2011-12-22 NOTE — Progress Notes (Signed)
[facility]                                                                                Patient Demographics  Deborah Williamson, is a 51 y.o. female  GNF:[id number]  QIO:[id number]  DOB - [birth year]  Admit date - 12/21/2011  Admitting Physician Ron Parker, MD  Outpatient Primary MD for the patient is PICKETT,KATHERINE, [place]  LOS - 1   Chief Complaint  Patient presents with  . Headache        Assessment & Plan    1. Intractable headache &  Left hemiparesis - caused by likely complex migraine, discussed the case with the neurologist on call Dr. Lauris Poag who agrees with present management, patient will test IV valproic acid for 2 more doses, LP for later today by IR, supportive care till then.   2. Secondary nausea vomiting due to headaches along with hypertension - stable with supportive care we'll monitor.   Code Status: Full  Family Communication: Discussed with the patient  Disposition Plan: Home    Procedures MRI Brain, LP   Consults  Neuro   Time Spent in minutes   35   Antibiotics     Anti-infectives    None      Scheduled Meds:   . sodium chloride   Intravenous STAT  . dexamethasone  10 mg Intravenous Once  . diphenhydrAMINE  12.5 mg Intravenous Once  . diphenhydrAMINE  25 mg Intravenous Once  .  HYDROmorphone (DILAUDID) injection  1 mg Intravenous NOW  . ketorolac  30 mg Intravenous Once  . lidocaine      . metoCLOPramide (REGLAN) injection  10 mg Intravenous Once  . potassium chloride  40 mEq Oral Once  . prochlorperazine  10 mg Intravenous Once  . QUEtiapine  12.5 mg Oral QHS  . sodium chloride  1,000 mL Intravenous Once  . valproate sodium  500 mg Intravenous To ER  . valproate sodium  500 mg Intravenous Daily  . valproic acid  500 mg Oral Once  . white petrolatum       Continuous Infusions:   . 0.9 % NaCl with KCl 20 mEq / L 75 mL/hr at 12/22/11 0042   PRN Meds:.acetaminophen, acetaminophen, alum & mag  hydroxide-simeth, HYDROmorphone (DILAUDID) injection, ketorolac, ondansetron (ZOFRAN) IV, ondansetron, oxyCODONE, zolpidem   DVT Prophylaxis   SCDs      Susa Raring K M.D on 12/22/2011 at 9:42 AM  Between 7am to 7pm - Pager - [phone]  After 7pm go to [email or web address] - password [email or web address]  And look for the night coverage person covering for me after hours  [facility] Office  [phone]    Subjective:   Deborah Williamson today has, still has mild generalized headache, No chest pain, No abdominal pain - No Nausea, No new weakness tingling or numbness except some left-sided only numbness and mild weakness at times, No Cough - SOB.   Objective:   Filed Vitals:   12/21/11 2230 12/22/11 0203 12/22/11 0500 12/22/11 0600  BP: 152/69 117/65  119/68  Pulse: 61 55  55  Temp: 98.3 F (36.8 C) 98.2 F (36.8 C)  98 F (36.7 C)  TempSrc:      Resp: 16 16  16   Height:   5' 0.6" (1.539 m)   Weight:   99.338 kg (219 lb)   SpO2: 100% 99%  100%    Wt Readings from Last 3 Encounters:  12/22/11 99.338 kg (219 lb)    No intake or output data in the 24 hours ending 12/22/11 0942  Exam Awake Alert, Oriented X 3, No new F.N deficits, Normal affect [place].AT,PERRAL Supple Neck,No JVD, No cervical lymphadenopathy appriciated.  Symmetrical Chest wall movement, Good air movement bilaterally, CTAB RRR,No Gallops,Rubs or new Murmurs, No Parasternal Heave +ve B.Sounds, Abd Soft, Non tender, No organomegaly appriciated, No rebound - guarding or rigidity. No Cyanosis, Clubbing or edema, No new Rash or bruise.   Data Review   Micro Results No results found for this or any previous visit (from the past 240 hour(s)).  Radiology Reports Ct Head Wo Contrast  12/19/2011  *RADIOLOGY REPORT*  Clinical Data:  Left-sided weakness.  Headache.  CT HEAD WITHOUT CONTRAST  Technique: Contiguous axial images were obtained from the base of the skull through the vertex without contrast  Comparison:  None  Findings:  There is no evidence of intracranial hemorrhage, brain edema, or other signs of acute infarction.  There is no evidence of intracranial mass lesion or mass effect.  No abnormal extraaxial fluid collections are identified.  There is no evidence of hydrocephalus, or other significant intracranial abnormality.  No skull abnormality identified.  IMPRESSION: Negative non-contrast head CT.   Original Report Authenticated By: Danae Orleans, M.D.    Mr Brain Wo Contrast  12/19/2011  *RADIOLOGY REPORT*  Clinical Data: Severe migraine headache which is improving.  Left- sided weakness.  MRI HEAD WITHOUT CONTRAST  Technique:  Multiplanar, multiecho pulse sequences of the brain and surrounding structures were obtained according to standard protocol without intravenous contrast.  Comparison: CT head earlier in the day  Findings: Subtle areas of focal linear and punctate restricted diffusion in the right frontal premotor cortex (images 23 - 26, series 4) display corresponding decreased signal on ADC mapping. No correlated abnormality on FLAIR or T2-weighted images.  No subarachnoid blood, hemorrhage, or mass lesion.  The findings are consistent with sequela of complicated migraine, which in all likelihood is reversible given the lack of prolonged T2 signal on other sequences.  Short-term follow-up is recommended, with pre and postcontrast imaging.  No acute cortically based infarct.  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  No midline shift.  Major intracranial vascular structures patent.  Slight prolonged DWI signal in the scalp overlying the right frontal region of uncertain significance; correlate with localized tenderness.  Normal pituitary and cerebellar tonsils.  Negative orbits, sinuses, mastoids.  IMPRESSION: Low level right hemisphere restricted diffusion could represent sequelae of complicated migraine.  Localized edema of the right frontal scalp may be a secondary sign.   The signal intensity on DWI sequence does not appear sufficiently intense to suggest acute infarction.  Recommend short term follow up as described above.  No evidence for subarachnoid blood, intra-axial mass lesion, or proximal vascular occlusion.   Original Report Authenticated By: Elsie Stain, M.D.     CBC  Lab 12/22/11 0518 12/19/11 0945  WBC 5.1 4.8  HGB 11.9* 12.3  HCT 35.7* 36.7  PLT PLATELET CLUMPS NOTED ON SMEAR, COUNT APPEARS DECREASED 275  MCV 80.8 80.8  MCH 26.9 27.1  MCHC 33.3 33.5  RDW 12.8 13.3  LYMPHSABS -- --  MONOABS -- --  EOSABS -- --  BASOSABS -- --  BANDABS -- --    Chemistries   Lab 12/22/11 0518 12/21/11 2200 12/19/11 0945  NA 136 -- 134*  K 4.0 -- 3.0*  CL 101 -- 100  CO2 25 -- 26  GLUCOSE 101* -- 93  BUN 14 -- 13  CREATININE 0.70 -- 0.82  CALCIUM 9.0 -- 8.7  MG -- 1.9 --  AST -- -- --  ALT -- -- --  ALKPHOS -- -- --  BILITOT -- -- --   ------------------------------------------------------------------------------------------------------------------ estimated creatinine clearance is 91.2 ml/min (by C-G formula based on Cr of 0.7). ------------------------------------------------------------------------------------------------------------------ No results found for this basename: HGBA1C:2 in the last 72 hours ------------------------------------------------------------------------------------------------------------------ No results found for this basename: CHOL:2,HDL:2,LDLCALC:2,TRIG:2,CHOLHDL:2,LDLDIRECT:2 in the last 72 hours ------------------------------------------------------------------------------------------------------------------ No results found for this basename: TSH,T4TOTAL,FREET3,T3FREE,THYROIDAB in the last 72 hours ------------------------------------------------------------------------------------------------------------------ No results found for this basename: VITAMINB12:2,FOLATE:2,FERRITIN:2,TIBC:2,IRON:2,RETICCTPCT:2 in  the last 72 hours  Coagulation profile  Lab 12/19/11 0945  INR 1.00  PROTIME --    No results found for this basename: DDIMER:2 in the last 72 hours  Cardiac Enzymes No results found for this basename: CK:3,CKMB:3,TROPONINI:3,MYOGLOBIN:3 in the last 168 hours ------------------------------------------------------------------------------------------------------------------ No components found with this basename: POCBNP:3

## 2011-12-22 NOTE — Procedures (Signed)
Successful fluoro guided lumbar puncture at L3-4 Opening pressure 15 cm water 12 mL blood-tinged CSF collected. No immediate complications.

## 2011-12-23 LAB — CBC
MCH: 27.4 pg (ref 26.0–34.0)
Platelets: 191 10*3/uL (ref 150–400)
RBC: 3.94 MIL/uL (ref 3.87–5.11)
RDW: 13.1 % (ref 11.5–15.5)
WBC: 5.1 10*3/uL (ref 4.0–10.5)

## 2011-12-23 LAB — BASIC METABOLIC PANEL
CO2: 27 mEq/L (ref 19–32)
Calcium: 8.3 mg/dL — ABNORMAL LOW (ref 8.4–10.5)
Chloride: 103 mEq/L (ref 96–112)
Creatinine, Ser: 0.82 mg/dL (ref 0.50–1.10)
GFR calc Af Amer: >90 mL/min (ref >90)
Sodium: 137 mEq/L (ref 135–145)

## 2011-12-23 LAB — SEDIMENTATION RATE: Sed Rate: 5 mm/hr (ref 0–22)

## 2011-12-23 LAB — C-REACTIVE PROTEIN: CRP: <0.5 mg/dL — ABNORMAL LOW (ref <0.60)

## 2011-12-23 MED ORDER — IBUPROFEN 600 MG PO TABS
600.0000 mg | ORAL_TABLET | Freq: Four times a day (QID) | ORAL | Status: DC | PRN
  Administered 2011-12-23: 600 mg via ORAL
  Filled 2011-12-23: qty 1

## 2011-12-23 MED ORDER — GABAPENTIN 100 MG PO CAPS
100.0000 mg | ORAL_CAPSULE | Freq: Two times a day (BID) | ORAL | Status: DC
  Administered 2011-12-23 – 2011-12-24 (×3): 100 mg via ORAL
  Filled 2011-12-23 (×5): qty 1

## 2011-12-23 MED ORDER — PANTOPRAZOLE SODIUM 40 MG PO TBEC
40.0000 mg | DELAYED_RELEASE_TABLET | Freq: Every day | ORAL | Status: DC
  Administered 2011-12-23 – 2011-12-24 (×2): 40 mg via ORAL
  Filled 2011-12-23 (×2): qty 1

## 2011-12-23 NOTE — Progress Notes (Signed)
[facility] PRO[facility]ESS NOTE    SUBJECTIVE   Patient states she has a 8/10 HA.  She is in room comfortably watching T.V.   OBJECTIVE   Vital signs in last 24 hours: Temp:  [97.2 F (36.2 C)-98.1 F (36.7 C)] 97.2 F (36.2 C) (09/30 0601) Pulse Rate:  [55-62] 56  (09/30 0601) Resp:  [16-20] 18  (09/30 0601) BP: (104-142)/(52-75) 104/55 mmHg (09/30 0601) SpO2:  [95 %-100 %] 100 % (09/30 0601)  Intake/Output from previous day:   Intake/Output this shift:   Nutritional status: General  Past Medical History  Diagnosis Date  . Hypertension     Neurologic ROS negative with exception of above . Musculoskeletal ROS none  Neurologic Exam:  Mental Status: Alert, oriented, thought content appropriate.  Speech fluent without evidence of aphasia.  Able to follow 3 step commands without difficulty. Cranial Nerves: II: Visual fields grossly normal, pupils equal, round, reactive to light and accommodation III,IV, VI: Ptosis not present, extra-ocular motions intact bilaterally V,VII: smile symmetric, facial light touch sensation normal bilaterally VIII: hearing normal bilaterally IX,X: gag reflex present XI: bilateral shoulder shrug XII: midline tongue extension Motor: Right : Upper extremity   5/5    Left:     Upper extremity   5/5 (give way strength)  Lower extremity   5/5     Lower extremity   5/5 Tone and bulk:normal tone throughout; no atrophy noted Sensory: Pinprick and light touch intact throughout, bilaterally Deep Tendon Reflexes: 2+ and symmetric throughout Plantars: Right: downgoing   Left: downgoing Cerebellar: normal finger-to-nose,  normal heel-to-shin test CV: pulses palpable throughout     Lab Results: No results found for this basename: cbc, bmp, coags, chol, tri, ldl, hga1c   Lipid Panel No results found for this basename: CHOL,TRIG,HDL,CHOLHDL,VLDL,LDLCALC in the last 72 hours  Studies/Results: Mr Laqueta Jean Wo Contrast  12/22/2011  *RADIOLOGY REPORT*  Clinical Data: Symptom complex most consistent with classic complex migraine.  Continued headaches with photophobia.  MRI HEAD WITHOUT AND WITH CONTRAST  Technique:  Multiplanar, multiecho pulse sequences of the brain and surrounding structures were obtained according to standard protocol without and with intravenous contrast  Contrast: 15mL MULTIHANCE GADOBENATE DIMEGLUMINE 529 MG/ML IV SOLN  Comparison: MRI brain 12/19/2011.  CT head 12/19/2011  Findings: Subtle areas of focal linear and punctate restricted diffusion in the right frontal premotor cortex on the prior exam appear to have normalized.  No prolonged T1 or T2 signal in the brain on other sequences.  No hemorrhage or abnormal post contrast enhancement.  Asymmetric right frontal scalp edema on DWI sequence persists, and is slightly more prominent.  Significance uncertain.  No acute stroke, brain edema, hydrocephalus, or extra-axial fluid. No abnormal post contrast enhancement.  Calvarium intact.  Clear sinuses and mastoids.  Negative orbits.  Major intracranial vascular structures patent.  IMPRESSION: Normalization of previous subtle areas of restricted diffusion in the right frontal region on prior exam may relate to transient edema from complicated migraine.  There is no corresponding T2 abnormality.  Slight asymmetry of signal from the scalp on DWI sequence in the right frontal region of uncertain significance.  This appears slightly more prominent than priors.  No acute intracranial findings.  No abnormal post contrast enhancement.   Original Report  Authenticated By: Elsie Stain, M.D.    Dg Fluoro Guide Lumbar Puncture  12/22/2011  *RADIOLOGY REPORT*  Clinical Data:  Headache, left arm numbness, fever  DIAGNOSTIC LUMBAR PUNCTURE UNDER FLUOROSCOPIC GUIDANCE  Fluoroscopy time:  56 seconds.  Technique:  Informed consent was obtained from the patient prior to the procedure, including potential  complications of headache, allergy, and pain.  With the patient prone, the lower back was prepped with Betadine. 1% Lidocaine was used for local anesthesia.  Lumbar puncture was performed at the L3-4 level using a 20 gauge needle with return of blood-tinged CSF with an opening pressure of 15 cm water.   12 ml of CSF were obtained for laboratory studies.  The patient tolerated the procedure well and there were no apparent complications.  IMPRESSION: Successful fluoroscopic guided lumbar puncture at L3-4, as described above.  Opening pressure 15 cm water.   Original Report Authenticated By: Charline Bills, M.D.     Medications:     Scheduled:   . sodium chloride   Intravenous STAT  . diphenhydrAMINE  12.5 mg Intravenous Once  . LORazepam      . LORazepam  1 mg Intravenous Once  . QUEtiapine  12.5 mg Oral QHS  . valproate sodium  500 mg Intravenous Daily    Assessment/Plan:   Patient with intractable headaches. 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   Recommendations:  1) Would keep Narcotics to a minimum 2) Continue with current treatment.    Discussed with Dr. Edd Arbour PA-C [facility] [phone]  12/23/2011, 8:55 AM

## 2011-12-23 NOTE — Progress Notes (Signed)
[facility]                                                                                Patient Demographics  Deborah Williamson, is a 51 y.o. female  RUE:[id number]  JYN:[id number]  DOB - 1960-09-29  Admit date - 12/21/2011  Admitting Physician Ron Parker, MD  Outpatient Primary MD for the patient is PICKETT,KATHERINE, [place]  LOS - 2   Chief Complaint  Patient presents with  . Headache        Assessment & Plan    1. Intractable headache &  Left hemiparesis - caused by likely complex migraine, discussed the case with the neurologist on call Dr. Lauris Poag who agrees with present management, patient will test IV valproic acid for 2 more doses from 12-22-11, LP results so far look unimpressive, opening pressure 15, ESR 5,  D/W Neuro minimize Narcotics, NSAIDs added, likely DC in am.   2. Secondary nausea vomiting due to headaches along with hypertension - stable with supportive care we'll monitor. No symptoms.   Code Status: Full  Family Communication: Discussed with the patient  Disposition Plan: Home    Procedures MRI Brain, LP   Consults  Neuro   Time Spent in minutes   35   Antibiotics     Anti-infectives    None      Scheduled Meds:    . sodium chloride   Intravenous STAT  . diphenhydrAMINE  12.5 mg Intravenous Once  . LORazepam      . LORazepam  1 mg Intravenous Once  . pantoprazole  40 mg Oral Q1200  . QUEtiapine  12.5 mg Oral QHS  . valproate sodium  500 mg Intravenous Daily   Continuous Infusions:    . sodium chloride 50 mL/hr at 12/22/11 1836  . 0.9 % NaCl with KCl 20 mEq / L 75 mL/hr at 12/22/11 0042   PRN Meds:.acetaminophen, acetaminophen, alum & mag hydroxide-simeth, diphenhydrAMINE, gadobenate dimeglumine, ibuprofen, ketorolac, ondansetron (ZOFRAN) IV, ondansetron, oxyCODONE, zolpidem, DISCONTD:  HYDROmorphone (DILAUDID) injection   DVT Prophylaxis   SCDs      Susa Raring K M.D on 12/23/2011 at 10:04  AM  Between 7am to 7pm - Pager - [phone]  After 7pm go to [email or web address] - password [email or web address]  And look for the night coverage person covering for me after hours  [facility] Office  [phone]    Subjective:   Deborah Williamson today has, still has mild generalized headache, No chest pain, No abdominal pain - No Nausea, No new weakness tingling or numbness except some left-sided only numbness and mild weakness at times, No Cough - SOB.   Objective:   Filed Vitals:   12/22/11 1755 12/22/11 2214 12/23/11 0149 12/23/11 0601  BP: 129/56 113/52 110/63 104/55  Pulse: 56 62 55 56  Temp: 97.8 F (36.6 C) 98.1 F (36.7 C) 97.7 F (36.5 C) 97.2 F (36.2 C)  TempSrc: Oral Oral Oral Oral  Resp: 20 18 18 18   Height:      Weight:      SpO2: 100% 100% 95% 100%    Wt Readings from Last 3 Encounters:  12/22/11 99.338 kg (219 lb)  No intake or output data in the 24 hours ending 12/23/11 1004  Exam Awake Alert, Oriented X 3, No new F.N deficits, Normal affect [place].AT,PERRAL Supple Neck,No JVD, No cervical lymphadenopathy appriciated.  Symmetrical Chest wall movement, Good air movement bilaterally, CTAB RRR,No Gallops,Rubs or new Murmurs, No Parasternal Heave +ve B.Sounds, Abd Soft, Non tender, No organomegaly appriciated, No rebound - guarding or rigidity. No Cyanosis, Clubbing or edema, No new Rash or bruise.   Data Review   Micro Results Recent Results (from the past 240 hour(s))  GRAM STAIN     Status: Normal   Collection Time   12/22/11  5:06 PM      Component Value Range Status Comment   Specimen Description CSF   Final    Special Requests NONE   Final    Gram Stain     Final    Value: CYTOSPIN SLIDE     NO WBC SEEN     NO ORGANISMS SEEN   Report Status 12/22/2011 FINAL   Final   CSF CULTURE     Status: Normal (Preliminary result)   Collection Time   12/22/11  5:06 PM      Component Value Range Status Comment   Specimen Description CSF   Final     Special Requests NONE   Final    Gram Stain     Final    Value: CYTOSPIN SLIDE NO WBC SEEN     NO ORGANISMS SEEN     Performed at [facility]   Culture NO GROWTH 1 DAY   Final    Report Status PENDING   Incomplete   FUNGUS CULTURE W SMEAR     Status: Normal (Preliminary result)   Collection Time   12/22/11  5:09 PM      Component Value Range Status Comment   Specimen Description CSF   Final    Special Requests NONE   Final    Fungal Smear NO YEAST OR FUNGAL ELEMENTS SEEN   Final    Culture CULTURE IN PROGRESS FOR FOUR WEEKS   Final    Report Status PENDING   Incomplete     Radiology Reports Ct Head Wo Contrast  12/19/2011  *RADIOLOGY REPORT*  Clinical Data:  Left-sided weakness.  Headache.  CT HEAD WITHOUT CONTRAST  Technique: Contiguous axial images were obtained from the base of the skull through the vertex without contrast  Comparison: None  Findings:  There is no evidence of intracranial hemorrhage, brain edema, or other signs of acute infarction.  There is no evidence of intracranial mass lesion or mass effect.  No abnormal extraaxial fluid collections are identified.  There is no evidence of hydrocephalus, or other significant intracranial abnormality.  No skull abnormality identified.  IMPRESSION: Negative non-contrast head CT.   Original Report Authenticated By: Danae Orleans, M.D.    Mr Brain Wo Contrast  12/19/2011  *RADIOLOGY REPORT*  Clinical Data: Severe migraine headache which is improving.  Left- sided weakness.  MRI HEAD WITHOUT CONTRAST  Technique:  Multiplanar, multiecho pulse sequences of the brain and surrounding structures were obtained according to standard protocol without intravenous contrast.  Comparison: CT head earlier in the day  Findings: Subtle areas of focal linear and punctate restricted diffusion in the right frontal premotor cortex (images 23 - 26, series 4) display corresponding decreased signal on ADC mapping. No correlated abnormality on FLAIR or  T2-weighted images.  No subarachnoid blood, hemorrhage, or mass lesion.  The findings are consistent with  sequela of complicated migraine, which in all likelihood is reversible given the lack of prolonged T2 signal on other sequences.  Short-term follow-up is recommended, with pre and postcontrast imaging.  No acute cortically based infarct.  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  No midline shift.  Major intracranial vascular structures patent.  Slight prolonged DWI signal in the scalp overlying the right frontal region of uncertain significance; correlate with localized tenderness.  Normal pituitary and cerebellar tonsils.  Negative orbits, sinuses, mastoids.  IMPRESSION: Low level right hemisphere restricted diffusion could represent sequelae of complicated migraine.  Localized edema of the right frontal scalp may be a secondary sign.  The signal intensity on DWI sequence does not appear sufficiently intense to suggest acute infarction.  Recommend short term follow up as described above.  No evidence for subarachnoid blood, intra-axial mass lesion, or proximal vascular occlusion.   Original Report Authenticated By: Elsie Stain, M.D.     CBC  Lab 12/23/11 0610 12/22/11 0518 12/19/11 0945  WBC 5.1 5.1 4.8  HGB 10.8* 11.9* 12.3  HCT 32.3* 35.7* 36.7  PLT 191 PLATELET CLUMPS NOTED ON SMEAR, COUNT APPEARS DECREASED 275  MCV 82.0 80.8 80.8  MCH 27.4 26.9 27.1  MCHC 33.4 33.3 33.5  RDW 13.1 12.8 13.3  LYMPHSABS -- -- --  MONOABS -- -- --  EOSABS -- -- --  BASOSABS -- -- --  BANDABS -- -- --    Chemistries   Lab 12/23/11 0610 12/22/11 0518 12/21/11 2200 12/19/11 0945  NA 137 136 -- 134*  K 3.8 4.0 -- 3.0*  CL 103 101 -- 100  CO2 27 25 -- 26  GLUCOSE 108* 101* -- 93  BUN 16 14 -- 13  CREATININE 0.82 0.70 -- 0.82  CALCIUM 8.3* 9.0 -- 8.7  MG -- -- 1.9 --  AST -- -- -- --  ALT -- -- -- --  ALKPHOS -- -- -- --  BILITOT -- -- -- --    ------------------------------------------------------------------------------------------------------------------ estimated creatinine clearance is 89 ml/min (by C-G formula based on Cr of 0.82). ------------------------------------------------------------------------------------------------------------------ No results found for this basename: HGBA1C:2 in the last 72 hours ------------------------------------------------------------------------------------------------------------------ No results found for this basename: CHOL:2,HDL:2,LDLCALC:2,TRIG:2,CHOLHDL:2,LDLDIRECT:2 in the last 72 hours ------------------------------------------------------------------------------------------------------------------ No results found for this basename: TSH,T4TOTAL,FREET3,T3FREE,THYROIDAB in the last 72 hours ------------------------------------------------------------------------------------------------------------------ No results found for this basename: VITAMINB12:2,FOLATE:2,FERRITIN:2,TIBC:2,IRON:2,RETICCTPCT:2 in the last 72 hours  Coagulation profile  Lab 12/19/11 0945  INR 1.00  PROTIME --    No results found for this basename: DDIMER:2 in the last 72 hours  Cardiac Enzymes No results found for this basename: CK:3,CKMB:3,TROPONINI:3,MYOGLOBIN:3 in the last 168 hours ------------------------------------------------------------------------------------------------------------------ No components found with this basename: POCBNP:3

## 2011-12-24 LAB — B. BURGDORFI ANTIBODIES: B burgdorferi Ab IgG+IgM: 1.16 {ISR} — ABNORMAL HIGH

## 2011-12-24 MED ORDER — QUETIAPINE 12.5 MG HALF TABLET: 12.5000 mg | ORAL_TABLET | Freq: Every day | ORAL | Status: DC

## 2011-12-24 MED ORDER — IBUPROFEN 600 MG PO TABS: 600.0000 mg | ORAL_TABLET | Freq: Four times a day (QID) | ORAL | Status: DC | PRN

## 2011-12-24 MED ORDER — ONDANSETRON HCL 4 MG PO TABS: 4.0000 mg | ORAL_TABLET | Freq: Four times a day (QID) | ORAL | Status: DC | PRN

## 2011-12-24 MED ORDER — HYDROCODONE-ACETAMINOPHEN 5-500 MG PO TABS: 1.0000 | ORAL_TABLET | Freq: Three times a day (TID) | ORAL | Status: DC | PRN

## 2011-12-24 MED ORDER — GABAPENTIN 100 MG PO CAPS: 100.0000 mg | ORAL_CAPSULE | Freq: Two times a day (BID) | ORAL | Status: DC

## 2011-12-24 MED ORDER — PANTOPRAZOLE SODIUM 40 MG PO TBEC: 40.0000 mg | DELAYED_RELEASE_TABLET | Freq: Every day | ORAL | Status: DC

## 2011-12-24 NOTE — Evaluation (Signed)
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 Presents to PT today with generalized weakness and balance deficits affecting her independence with functional mobility and ADLs. Will benefit physical therapy in the acute setting to address these and the below impairments so as to maximize mobility and independence prior to d/c home. Rec RW, 3in1 and HHPT for f/u as well a supervision/minA for all OOB/mobility. Pt understands my recommendations and reports she might be able to get her sister to stay with her at night.     PT Assessment  Patient needs continued PT services    Follow Up Recommendations  Home health PT;Supervision for mobility/OOB    Barriers to Discharge Inaccessible home environment (pt sleeps on the second floor); Decreased caregiver support limited help at night when daughter is at work     Engineer, agricultural with 5" wheels;3 in 1 bedside comode    Recommendations for Other Services     Frequency Min 4X/week    Precautions / Restrictions Precautions Precautions: Fall   Pertinent Vitals/Pain Reporting 10/10 HA on the left       Mobility  Bed Mobility Bed Mobility: Supine to Sit;Sit to Supine Supine to Sit: 6: Modified independent (Device/Increase time) Sit to Supine: 6: Modified independent (Device/Increase time) Transfers Transfers: Sit to Stand;Stand to Sit Sit to Stand: 4: Min guard;With upper extremity assist;From bed;From chair/3-in-1 Stand to Sit: With upper extremity assist;5: Supervision;To bed;To chair/3-in-1 Details for Transfer Assistance: definite need of upper extremities for stablity when standing, cues for safe hand  placement when using RW Ambulation/Gait Ambulation/Gait Assistance: 4: Min assist;4: Min guard Ambulation Distance (Feet): 20 Feet Assistive device: Rolling walker;1 person hand held assist Ambulation/Gait Assistance Details: initially ambulating without AD however pt tripped needing minA to correct and regain stability, ambulated another 5 ft with LUE HHA, with RW pt more mingaurdA with improved stability during gait Gait Pattern: Step-through pattern;Trunk flexed Gait velocity: slow and cautious Stairs: No (pt declined practicing stairs)              PT Diagnosis: Difficulty walking;Abnormality of gait;Generalized weakness;Acute pain  PT Problem List: Decreased strength;Decreased activity tolerance;Decreased balance;Decreased mobility;Pain;Decreased knowledge of use of DME;Impaired sensation PT Treatment Interventions: DME instruction;Gait training;Stair training;Functional mobility training;Therapeutic activities;Therapeutic exercise;Balance training;Neuromuscular re-education;Patient/family education   PT Goals Acute Rehab PT Goals PT Goal Formulation: With patient Time For Goal Achievement: 12/31/11 Potential to Achieve Goals: Good Pt will go Sit to Stand: with modified independence PT Goal: Sit to Stand - Progress: Goal set today Pt will go Stand to Sit: with modified independence PT Goal: Stand to Sit - Progress: Goal set today Pt will Transfer Bed to Chair/Chair to Bed: with modified independence PT Transfer Goal: Bed to Chair/Chair to Bed - Progress: Goal set today Pt will Ambulate: 51 - 150 feet;with modified independence;with least restrictive assistive device PT Goal: Ambulate - Progress: Goal set today Pt will Go Up / Down Stairs: Flight;with min assist;with rail(s) PT Goal: Up/Down Stairs - Progress: Goal set today  Visit Information  Last PT Received On: 12/24/11 Assistance Needed: +1    Subjective Data  Subjective: Im just so uncomfortable, my head is hurting  10/10 and I threw up all my food yesterday.  Prior Functioning  Home Living Lives With: Daughter Available Help at Discharge: Family;Available PRN/intermittently Type of Home: House Home Access: Stairs to enter [employer] of Steps: 2 Entrance Stairs-Rails: None Home Layout: Two level;Bed/bath upstairs Alternate Level Stairs-Number of Steps: pt reports she could sleep in the living room if she needed to Alternate Level Stairs-Rails: Right Bathroom Shower/Tub: Engineer, manufacturing systems: Standard Home Adaptive Equipment: None Additional Comments: reports she takes baths, daughter works night shift so is there during the day and reports she could ask her daughter to come stay with her at night Prior Function Level of Independence: Independent Able to Take Stairs?: Yes Driving: Yes Vocation: Full time employment 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, English as a foreign language: No difficulties    Cognition  Overall Cognitive Status: Appears within functional limits for tasks assessed/performed Arousal/Alertness: Awake/alert Orientation Level: Appears intact for tasks assessed Behavior During Session: Anxious    Extremity/Trunk Assessment Right Upper Extremity Assessment RUE ROM/Strength/Tone: WFL for tasks assessed RUE Sensation: WFL - Light Touch RUE Coordination: WFL - gross/fine motor Left Upper Extremity Assessment LUE ROM/Strength/Tone: WFL for tasks assessed LUE Sensation: Deficits LUE Sensation Deficits: reports heightened sensitivity and minimal pain to touch? describes it as novocaine wearing off  LUE Coordination: WFL - gross/fine motor Right Lower Extremity Assessment RLE ROM/Strength/Tone: WFL for tasks assessed RLE Sensation: WFL - Light Touch RLE Coordination: WFL - gross/fine motor Left Lower Extremity Assessment LLE ROM/Strength/Tone: Deficits LLE ROM/Strength/Tone Deficits: grossly 3+/5 LLE Sensation: Deficits LLE Sensation  Deficits: diminished light touch LLE Coordination: WFL - gross/fine motor Trunk Assessment Trunk Assessment: Normal   Balance    End of Session PT - End of Session Equipment Utilized During Treatment: Gait belt Activity Tolerance: Patient limited by fatigue;Patient limited by pain Patient left: in bed;with call bell/phone within reach;with bed alarm set Nurse Communication: Mobility status  GP     [facility] HELEN 12/24/2011, 8:57 AM

## 2011-12-24 NOTE — Progress Notes (Signed)
Reviewed discharge instructions with patient, given RX.   Will review instructions with family when arrive.

## 2011-12-24 NOTE — Discharge Summary (Signed)
[facility]                                                                                   Deborah Williamson, is a 51 y.o. female  DOB [birth year]  MRN [id number].  Admission date:  12/21/2011  Discharge Date:  12/24/2011  Primary MD  Ricci Barker, [place]  Admitting Physician  Ron Parker, MD  Admission Diagnosis  Headache [784.0] Complicated migraine [346.00] Left-sided weakness [728.87] Headache  Discharge Diagnosis     Principal Problem:  *Intractable headache Active Problems:  Left hemiparesis  Hypertension  Hypokalemia  Nausea and vomiting   Past Medical History  Diagnosis Date  . Hypertension     Past Surgical History  Procedure Date  . Arm sugery Both     Plastic Surgery to remove loose skin after Weight Loss     Recommendations for primary care physician for things to follow:   He is follow final CSF cultures which take about 3-4 weeks to come back.   Discharge Diagnoses:   Principal Problem:  *Intractable headache Active Problems:  Left hemiparesis  Hypertension  Hypokalemia  Nausea and vomiting    Discharge Condition: Stable   Diet recommendation: See Discharge Instructions below   Consults neurology   History of present illness and  Hospital Course:  See H&P, Labs, Consult and Test reports for all details in brief, patient was admitted for headaches of unclear etiology likely complex migraine per the neurologist. Patient underwent MRI, lumbar puncture with various CSF studies, preliminary results unremarkable, stable ESR and CRP, no fevers nontoxic appearance, per neurologist headaches suggestive of complex migraines and can last a few weeks. Neurologist recommended supportive care with close outpatient neurology followup. They have nothing further to add at this point. They have instructed me to discharge the patient with outpatient neurology followup.   I have personally requested the patient to follow with  primary care physician and neurologist closely, if headaches persist referred to tertiary care center for complex headaches should be considered. Per neurologist at our hospital no further workup recommended at this time.   Today   Subjective:   Deborah Williamson today has mild generalized headache headache,no chest abdominal pain,no new weakness tingling or numbness.   Objective:   Blood pressure 104/56, pulse 63, temperature 97.8 F (36.6 C), temperature source Oral, resp. rate 16, height 5' 0.6" (1.539 m), weight 99.338 kg (219 lb), last menstrual period 12/10/2011, SpO2 100.00%.   Intake/Output Summary (Last 24 hours) at 12/24/11 0812 Last data filed at 12/23/11 1537  Gross per 24 hour  Intake    660 ml  Output      0 ml  Net    660 ml    Exam Awake Alert, Oriented *3, No new F.N deficits, Normal affect .AT,PERRAL Supple Neck,No JVD, No cervical lymphadenopathy appriciated.  Symmetrical Chest wall movement, Good air movement bilaterally, CTAB RRR,No Gallops,Rubs or new Murmurs, No Parasternal Heave +ve B.Sounds, Abd Soft, Non tender, No organomegaly appriciated, No rebound -guarding or rigidity. No Cyanosis, Clubbing or edema, No new Rash or bruise  Data Review   Major procedures and Radiology Reports - PLEASE review detailed and final reports  for all details in brief -    Ct Head Wo Contrast  12/19/2011  *RADIOLOGY REPORT*  Clinical Data:  Left-sided weakness.  Headache.  CT HEAD WITHOUT CONTRAST  Technique: Contiguous axial images were obtained from the base of the skull through the vertex without contrast  Comparison: None  Findings:  There is no evidence of intracranial hemorrhage, brain edema, or other signs of acute infarction.  There is no evidence of intracranial mass lesion or mass effect.  No abnormal extraaxial fluid collections are identified.  There is no evidence of hydrocephalus, or other significant intracranial abnormality.  No skull abnormality identified.   IMPRESSION: Negative non-contrast head CT.   Original Report Authenticated By: Danae Orleans, M.D.    Mr Brain Wo Contrast  12/19/2011  *RADIOLOGY REPORT*  Clinical Data: Severe migraine headache which is improving.  Left- sided weakness.  MRI HEAD WITHOUT CONTRAST  Technique:  Multiplanar, multiecho pulse sequences of the brain and surrounding structures were obtained according to standard protocol without intravenous contrast.  Comparison: CT head earlier in the day  Findings: Subtle areas of focal linear and punctate restricted diffusion in the right frontal premotor cortex (images 23 - 26, series 4) display corresponding decreased signal on ADC mapping. No correlated abnormality on FLAIR or T2-weighted images.  No subarachnoid blood, hemorrhage, or mass lesion.  The findings are consistent with sequela of complicated migraine, which in all likelihood is reversible given the lack of prolonged T2 signal on other sequences.  Short-term follow-up is recommended, with pre and postcontrast imaging.  No acute cortically based infarct.  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  No midline shift.  Major intracranial vascular structures patent.  Slight prolonged DWI signal in the scalp overlying the right frontal region of uncertain significance; correlate with localized tenderness.  Normal pituitary and cerebellar tonsils.  Negative orbits, sinuses, mastoids.  IMPRESSION: Low level right hemisphere restricted diffusion could represent sequelae of complicated migraine.  Localized edema of the right frontal scalp may be a secondary sign.  The signal intensity on DWI sequence does not appear sufficiently intense to suggest acute infarction.  Recommend short term follow up as described above.  No evidence for subarachnoid blood, intra-axial mass lesion, or proximal vascular occlusion.   Original Report Authenticated By: Elsie Stain, M.D.    Mr Laqueta Jean Wo Contrast  12/22/2011  *RADIOLOGY  REPORT*  Clinical Data: Symptom complex most consistent with classic complex migraine.  Continued headaches with photophobia.  MRI HEAD WITHOUT AND WITH CONTRAST  Technique:  Multiplanar, multiecho pulse sequences of the brain and surrounding structures were obtained according to standard protocol without and with intravenous contrast  Contrast: 15mL MULTIHANCE GADOBENATE DIMEGLUMINE 529 MG/ML IV SOLN  Comparison: MRI brain 12/19/2011.  CT head 12/19/2011  Findings: Subtle areas of focal linear and punctate restricted diffusion in the right frontal premotor cortex on the prior exam appear to have normalized.  No prolonged T1 or T2 signal in the brain on other sequences.  No hemorrhage or abnormal post contrast enhancement.  Asymmetric right frontal scalp edema on DWI sequence persists, and is slightly more prominent.  Significance uncertain.  No acute stroke, brain edema, hydrocephalus, or extra-axial fluid. No abnormal post contrast enhancement.  Calvarium intact.  Clear sinuses and mastoids.  Negative orbits.  Major intracranial vascular structures patent.  IMPRESSION: Normalization of previous subtle areas of restricted diffusion in the right frontal region on prior exam may relate to transient edema from complicated migraine.  There is no corresponding T2 abnormality.  Slight asymmetry of signal from the scalp on DWI sequence in the right frontal region of uncertain significance.  This appears slightly more prominent than priors.  No acute intracranial findings.  No abnormal post contrast enhancement.   Original Report Authenticated By: Elsie Stain, M.D.    Dg Fluoro Guide Lumbar Puncture  12/22/2011  *RADIOLOGY REPORT*  Clinical Data:  Headache, left arm numbness, fever  DIAGNOSTIC LUMBAR PUNCTURE UNDER FLUOROSCOPIC GUIDANCE  Fluoroscopy time:  56 seconds.  Technique:  Informed consent was obtained from the patient prior to the procedure, including potential complications of headache, allergy, and pain.   With the patient prone, the lower back was prepped with Betadine. 1% Lidocaine was used for local anesthesia.  Lumbar puncture was performed at the L3-4 level using a 20 gauge needle with return of blood-tinged CSF with an opening pressure of 15 cm water.   12 ml of CSF were obtained for laboratory studies.  The patient tolerated the procedure well and there were no apparent complications.  IMPRESSION: Successful fluoroscopic guided lumbar puncture at L3-4, as described above.  Opening pressure 15 cm water.   Original Report Authenticated By: Charline Bills, M.D.     Micro Results     Recent Results (from the past 240 hour(s))  GRAM STAIN     Status: Normal   Collection Time   12/22/11  5:06 PM      Component Value Range Status Comment   Specimen Description CSF   Final    Special Requests NONE   Final    Gram Stain     Final    Value: CYTOSPIN SLIDE     NO WBC SEEN     NO ORGANISMS SEEN   Report Status 12/22/2011 FINAL   Final   CSF CULTURE     Status: Normal (Preliminary result)   Collection Time   12/22/11  5:06 PM      Component Value Range Status Comment   Specimen Description CSF   Final    Special Requests NONE   Final    Gram Stain     Final    Value: CYTOSPIN SLIDE NO WBC SEEN     NO ORGANISMS SEEN     Performed at [facility]   Culture NO GROWTH 1 DAY   Final    Report Status PENDING   Incomplete   FUNGUS CULTURE W SMEAR     Status: Normal (Preliminary result)   Collection Time   12/22/11  5:09 PM      Component Value Range Status Comment   Specimen Description CSF   Final    Special Requests NONE   Final    Fungal Smear NO YEAST OR FUNGAL ELEMENTS SEEN   Final    Culture CULTURE IN PROGRESS FOR FOUR WEEKS   Final    Report Status PENDING   Incomplete     Results for PHINLEY, SCHALL (MRN [id number]) as of 12/24/2011 08:13  Ref. Range 12/22/2011 17:05 12/22/2011 17:07  Glucose, CSF Latest Range: 43-76 mg/dL  58  Total  Protein, CSF Latest Range: 15-45 mg/dL  31   RBC Count, CSF Latest Range: 0 /cu mm 10 (H)   WBC, CSF Latest Range: 0-5 /cu mm 1   Lymphs, CSF Latest Range: 40-80 % FEW   Other Cells, CSF No range found TOO FEW TO COUNT, SMEAR AVAILABLE FOR REVIEW   Appearance, CSF Latest Range: CLEAR  CLEAR  Color, CSF Latest Range: COLORLESS  COLORLESS   Supernatant No range found NOT INDICATED   Tube # No range found 3     Results for JAYANNA, KROEGER (MRN [id number]) as of 12/24/2011 08:13  Ref. Range 12/23/2011 06:10  CRP Latest Range: <0.60 mg/dL <1.3 (L)    Results for ARRON, MCNAUGHT (MRN [id number]) as of 12/24/2011 08:13  Ref. Range 12/21/2011 22:00 12/23/2011 06:10  Sed Rate Latest Range: 0-22 mm/hr 12 5     CBC w Diff: Lab Results  Component Value Date   WBC 5.1 12/23/2011   HGB 10.8* 12/23/2011   HCT 32.3* 12/23/2011   PLT 191 12/23/2011    CMP: Lab Results  Component Value Date   NA 137 12/23/2011   K 3.8 12/23/2011   CL 103 12/23/2011   CO2 27 12/23/2011   BUN 16 12/23/2011   CREATININE 0.82 12/23/2011   PROT 7.0 02/02/2009   ALBUMIN 3.0* 02/02/2009   BILITOT 0.6 02/02/2009   ALKPHOS 58 02/02/2009   AST 19 02/02/2009   ALT 16 02/02/2009  .   Discharge Instructions     Follow with Primary MD PICKETT,KATHERINE, PA in 7 days   Get CBC, CMP, checked 7 days by Primary MD and get final results of his CSF cultures reviewed in 2-3 weeks.  Get Medicines reviewed and adjusted.  Please request your Prim.MD to go over all Hospital Tests and Procedure/Radiological results at the follow up, please get all Hospital records sent to your Prim MD by signing hospital release before you go home.  Activity: As tolerated with Full fall precautions use walker/cane & assistance as needed   Diet:  Heart Healthy  For Heart failure patients - Check your Weight same time everyday, if you gain over 2 pounds, or you develop in leg swelling, experience more shortness of breath or chest pain, call your Primary MD immediately. Follow Cardiac Low Salt  Diet and 1.8 lit/day fluid restriction.  Disposition Home    If you experience worsening of your admission symptoms, develop shortness of breath, life threatening emergency, suicidal or homicidal thoughts you must seek medical attention immediately by calling 911 or calling your MD immediately  if symptoms less severe.  You Must read complete instructions/literature along with all the possible adverse reactions/side effects for all the Medicines you take and that have been prescribed to you. Take any new Medicines after you have completely understood and accpet all the possible adverse reactions/side effects.   Do not drive until you have seen by Primary MD or a Neurologist and advised to do so again.  Do not drive when taking Pain medications.    Do not take more than prescribed Pain, Sleep and Anxiety Medications  Special Instructions: If you have smoked or chewed Tobacco  in the last 2 yrs please stop smoking, stop any regular Alcohol  and or any Recreational drug use.  Wear Seat belts while driving.  Follow-up Information    Follow up with PICKETT,KATHERINE, PA. Schedule an appointment as soon as possible for a visit in 3 days.   Contact information:   [address] [place] [address] [phone]       Follow up with Levert Feinstein, MD. Schedule an appointment as soon as possible for a visit in 1 week.   Contact information:   [address] [place] [address] [phone]            Discharge Medications     Medication List  As of 12/24/2011  8:12 AM    START taking these medications         gabapentin 100 MG capsule   Commonly known as: NEURONTIN   Take 1 capsule (100 mg total) by mouth 2 (two) times daily.      ibuprofen 600 MG tablet   Commonly known as: ADVIL,MOTRIN   Take 1 tablet (600 mg total) by mouth every 6 (six) hours as needed (headache).      ondansetron 4 MG tablet   Commonly known as: ZOFRAN   Take 1 tablet (4 mg total) by  mouth every 6 (six) hours as needed for nausea.      pantoprazole 40 MG tablet   Commonly known as: PROTONIX   Take 1 tablet (40 mg total) by mouth daily.      QUEtiapine 12.5 mg Tabs   Commonly known as: SEROQUEL   Take 0.5 tablets (12.5 mg total) by mouth at bedtime.      CHANGE how you take these medications         HYDROcodone-acetaminophen 5-500 MG per tablet   Commonly known as: VICODIN   Take 1-2 tablets by mouth every 8 (eight) hours as needed. For pain.   What changed: how often to take the med      CONTINUE taking these medications         aspirin 81 MG chewable tablet   Chew 1 tablet (81 mg total) by mouth daily.      eszopiclone 3 MG Tabs   Generic drug: Eszopiclone      hydrochlorothiazide 25 MG tablet   Commonly known as: HYDRODIURIL      STOP taking these medications         predniSONE 20 MG tablet   Commonly known as: DELTASONE          Where to get your medications    These are the prescriptions that you need to pick up. We sent them to a specific pharmacy, so you will need to go there to get them.   [id number] - [place], [place] - [address] AT [facility] [address]    [address] [place] [address]    Phone: [phone]        gabapentin 100 MG capsule   ibuprofen 600 MG tablet   ondansetron 4 MG tablet   pantoprazole 40 MG tablet   QUEtiapine 12.5 mg Tabs         You may get these medications from any pharmacy.         HYDROcodone-acetaminophen 5-500 MG per tablet               Total Time in preparing paper work, data evaluation and todays exam - 35 minutes  Leroy Sea M.D on 12/24/2011 at 8:12 AM  [facility] Office  [phone]

## 2011-12-24 NOTE — Progress Notes (Signed)
Physical Therapy Treatment Patient Details Name: Deborah Williamson MRN: [id number] DOB: 11/20/1960 Today's Date: 12/24/2011 Time: 1214-1223 PT Time Calculation (min): 9 min  PT Assessment / Plan / Recommendation Comments on Treatment Session  Later session to practice stairs prior to pt d/c home this afternoon. Pt agreeable and moving much safer using RW. Educated pt on stair technique and she was able to perform with minA. Pt able to describe to me safe sequence for stairs as her mother will be there with her to help out at home.     Follow Up Recommendations  Home health PT;Supervision for mobility/OOB    Barriers to Discharge        Equipment Recommendations  Rolling walker with 5" wheels;3 in 1 bedside comode    Recommendations for Other Services    Frequency     Plan Discharge plan remains appropriate;Frequency remains appropriate    Precautions / Restrictions Precautions Precautions: Fall       Mobility  Bed Mobility Supine to Sit: 7: Independent Transfers Transfers: Sit to Stand;Stand to Sit Sit to Stand: 5: Supervision;With upper extremity assist;From bed Stand to Sit: 6: Modified independent (Device/Increase time);With upper extremity assist;To bed Details for Transfer Assistance: verbal cues for safe hand placement with RW Ambulation/Gait Ambulation/Gait Assistance: 5: Supervision Ambulation Distance (Feet): 100 Feet Assistive device: Rolling walker Ambulation/Gait Assistance Details: cues for tall posture and safe technique with RW Gait Pattern: Trunk flexed;Shuffle;Step-through pattern Gait velocity: slowed Stairs: Yes Stairs Assistance: 4: Min Editor, commissioning Details (indicate cue type and reason): min HHA on the right for stability to ascend and descend steps, cues for safe technique and pattern  Stair Management Technique: No rails;Step to pattern Number of Stairs: 2       PT Goals Acute Rehab PT Goals PT Goal: Sit to Stand - Progress:  Progressing toward goal PT Goal: Stand to Sit - Progress: Progressing toward goal PT Transfer Goal: Bed to Chair/Chair to Bed - Progress: Progressing toward goal PT Goal: Ambulate - Progress: Progressing toward goal PT Goal: Up/Down Stairs - Progress: Progressing toward goal  Visit Information  Last PT Received On: 12/24/11 Assistance Needed: +1    Subjective Data  Subjective: This walker is much better, I feel much more supported.  Patient Stated Goal: home   Cognition  Overall Cognitive Status: Appears within functional limits for tasks assessed/performed Arousal/Alertness: Awake/alert Orientation Level: Appears intact for tasks assessed Behavior During Session: [facility] for tasks performed    Balance     End of Session PT - End of Session Equipment Utilized During Treatment: Gait belt Activity Tolerance: Patient tolerated treatment well Patient left: in bed;with call bell/phone within reach;with family/visitor present Nurse Communication: Mobility status   GP     [facility] Deborah Williamson 12/24/2011, 1:56 PM

## 2011-12-24 NOTE — Care Management Note (Signed)
    Page 1 of 2   12/24/2011     12:26:04 PM   CARE MANAGEMENT NOTE 12/24/2011  Patient:  Deborah Williamson, Deborah Williamson   Account Number:  [id number]  Date Initiated:  12/23/2011  Documentation initiated by:  [facility]  Subjective/Objective Assessment:   ADmitted with severe headache, left sided weakness.     Action/Plan:   PT eval-recommending HHPT   Anticipated DC Date:  12/24/2011   Anticipated DC Plan:  HOME W HOME HEALTH SERVICES      DC Planning Services  CM consult      Choice offered to / List presented to:  C-1 Patient   DME arranged  3-N-1  Levan Hurst      DME agency  [facility]     HH arranged  HH-2 PT      [facility] agency  [facility]   Status of service:  Completed, signed off Medicare Important Message given?   (If response is "NO", the following Medicare IM given date fields will be blank) Date Medicare IM given:   Date Additional Medicare IM given:    Discharge Disposition:  HOME W HOME HEALTH SERVICES  Per UR Regulation:  Reviewed for med. necessity/level of care/duration of stay  If discussed at Long Length of Stay Meetings, dates discussed:    Comments:  12/24/11 Spoke with patient about HHC for HHPT. She chose [facility] for HHPT from the [facility] agencies list. Contacted [facility] and requested HHPT, faxed Hand P, facesheet, PT eval, HHPT order, face to face and d/c summary to [phone]. Called and confirmed receipt of information with Maralyn Sago. They will service the patient within 48hrs. Patient received rolling walker and 3 in 1 from [facility] prior to discharge. Jacquelynn Cree RN, BSN, CCM

## 2011-12-26 LAB — CSF CULTURE W GRAM STAIN
Culture: NO GROWTH
Gram Stain: NONE SEEN

## 2012-01-17 LAB — FUNGUS CULTURE W SMEAR: Fungal Smear: NONE SEEN

## 2012-02-19 ENCOUNTER — Ambulatory Visit (HOSPITAL_COMMUNITY)
Admission: RE | Admit: 2012-02-19 | Discharge: 2012-02-19 | Disposition: A | Discharge status: Home / Self Care | Payer: BC Managed Care – PPO | Attending: Psychiatry | Admitting: Psychiatry

## 2012-02-19 DIAGNOSIS — F332 Major depressive disorder, recurrent severe without psychotic features: Secondary | ICD-10-CM | POA: Insufficient documentation
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
## 2012-02-25 ENCOUNTER — Encounter (HOSPITAL_COMMUNITY): Payer: Self-pay

## 2012-02-25 ENCOUNTER — Other Ambulatory Visit (HOSPITAL_COMMUNITY): Payer: BC Managed Care – PPO | Attending: Psychiatry | Admitting: Psychiatry

## 2012-02-25 VITALS — BP 162/82

## 2012-02-25 DIAGNOSIS — F332 Major depressive disorder, recurrent severe without psychotic features: Secondary | ICD-10-CM | POA: Insufficient documentation

## 2012-02-25 DIAGNOSIS — F411 Generalized anxiety disorder: Secondary | ICD-10-CM

## 2012-02-25 DIAGNOSIS — F32A Depression, unspecified: Secondary | ICD-10-CM

## 2012-02-25 DIAGNOSIS — F329 Major depressive disorder, single episode, unspecified: Secondary | ICD-10-CM | POA: Insufficient documentation

## 2012-02-25 DIAGNOSIS — F331 Major depressive disorder, recurrent, moderate: Secondary | ICD-10-CM

## 2012-02-25 NOTE — Progress Notes (Signed)
    Daily Group Progress Note  Program: IOP  Group Time: 9:00-10:30 am   Participation Level: Minimal  Behavioral Response: Appropriate  Type of Therapy:  Process Group  Summary of Progress: Today was patients first day in the group. She was quiet and observed the group process. She said during her assessment that she is struggling with depression and anxiety. She said at the end of the group "I no longer feel crazy and no longer feel alone".     Group Time: 10:30 am - 12:00 pm   Participation Level:  Minimal  Behavioral Response: Appropriate  Type of Therapy: Psycho-education Group  Summary of Progress: Patient participated in a discussion and education segment on self-esteem and the causes of low self-esteem. Patient identified having low self-esteem currently and gave examples from the past that impacted how patient currently views their self-worth and how this is impacting their behavior today.  Carman Ching, LCSW
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
## 2012-02-26 ENCOUNTER — Encounter (HOSPITAL_COMMUNITY): Payer: BC Managed Care – PPO | Attending: Psychiatry | Admitting: Psychiatry

## 2012-02-26 DIAGNOSIS — F411 Generalized anxiety disorder: Secondary | ICD-10-CM | POA: Insufficient documentation

## 2012-02-26 DIAGNOSIS — Z79899 Other long term (current) drug therapy: Secondary | ICD-10-CM | POA: Insufficient documentation

## 2012-02-26 DIAGNOSIS — F332 Major depressive disorder, recurrent severe without psychotic features: Secondary | ICD-10-CM | POA: Insufficient documentation

## 2012-02-26 DIAGNOSIS — F329 Major depressive disorder, single episode, unspecified: Secondary | ICD-10-CM

## 2012-02-26 DIAGNOSIS — F32A Depression, unspecified: Secondary | ICD-10-CM

## 2012-02-26 DIAGNOSIS — I10 Essential (primary) hypertension: Secondary | ICD-10-CM | POA: Insufficient documentation

## 2012-02-26 DIAGNOSIS — Z7982 Long term (current) use of aspirin: Secondary | ICD-10-CM | POA: Insufficient documentation

## 2012-02-27 ENCOUNTER — Encounter (HOSPITAL_COMMUNITY): Payer: BC Managed Care – PPO | Admitting: Psychiatry

## 2012-02-27 DIAGNOSIS — F32A Depression, unspecified: Secondary | ICD-10-CM

## 2012-02-27 DIAGNOSIS — F329 Major depressive disorder, single episode, unspecified: Secondary | ICD-10-CM

## 2012-02-27 NOTE — Progress Notes (Signed)
    Daily Group Progress Note  Program: IOP  Group Time: 9:00-10:30 am   Participation Level: Active  Behavioral Response: Appropriate  Type of Therapy:  Process Group  Summary of Progress: Patient shared about grieving the loss of her husband who died three years ago and being upset with herself for not being able to move past this loss. She is embarrassed by her level of depression and how she is unable to concentrate and maintain her physical appearance. She received support from others who also struggle with the same things due to depression.      Group Time: 10:30 am - 12:00 pm   Participation Level:  Active  Behavioral Response: Appropriate  Type of Therapy: Psycho-education Group  Summary of Progress: Patient discussed the homework assignment from the previous group on self-esteem and listed positive qualities about self to challenge negative perceptions and self-talk.   Carman Ching, LCSW

## 2012-02-27 NOTE — Progress Notes (Signed)
Patient ID: Deborah Williamson, female   DOB: Aug 25, 1960, 51 y.o.   MRN: [id number] Patient reviewed and interviewed along with [name], patient complains of insomnia and has been taking the Remeron and Restoril 30 mg every night and still has insomnia. Discussed sleep hygiene patient stated understanding. Patient also states that the Remeron does not put her to sleep and so it was recommended that she take the Zoloft 100 mg in the Remeron to 30 mg every morning and take Restoril 30 mg at bedtime. She stated understanding.denies SI or HI

## 2012-02-27 NOTE — Progress Notes (Signed)
    Daily Group Progress Note  Program: IOP  Group Time: 9:00-10:30 am   Participation Level: Active  Behavioral Response: Appropriate  Type of Therapy:  Process Group  Summary of Progress: Patient reports still having high depression but is talking openly and sharing stressors. She identified that she misses her husband but also is realizing that she "hates her job". She said "I want to escape and go to the [place] because I vacationed there once and that is the only time in my life I felt calm". She also mentioned having sadness associated with her childhood due to how her mother treated her differently from her sister because she was "darker skinned". She said I feel like I am always competing with my family and can't measure up.      Group Time: 10:30 am - 12:00 pm   Participation Level:  Active  Behavioral Response: Appropriate  Type of Therapy: Psycho-education Group  Summary of Progress: Patient participated in learning the DBT skill of Mindfulness. Patient learned the steps to accessing WISE mind through observe, describe and participate and was given homework to practice the skill.  Carman Ching, LCSW

## 2012-02-28 ENCOUNTER — Encounter (HOSPITAL_COMMUNITY): Payer: BC Managed Care – PPO | Admitting: Psychiatry

## 2012-02-28 DIAGNOSIS — F329 Major depressive disorder, single episode, unspecified: Secondary | ICD-10-CM

## 2012-02-28 DIAGNOSIS — F32A Depression, unspecified: Secondary | ICD-10-CM

## 2012-02-28 NOTE — Progress Notes (Signed)
    Daily Group Progress Note  Program: IOP  Group Time: 9:00-10:30 am    Participation Level: Active  Behavioral Response: Appropriate  Type of Therapy:  Process Group  Summary of Progress: Patient reports no progress with her depression since starting the group. She wore pajama bottoms today and is still not able to dress herself or maintain her ADL's. She appears more engaged in the discussions and is more talkative. She was nodding when relating to others and their symptoms of depression.      Group Time: 10:30 am - 12:00 pm   Participation Level:  Active  Behavioral Response: Appropriate  Type of Therapy: Psycho-education Group  Summary of Progress: Patient practiced the skill of Mindfulness that was discussed yesterday and reported back on what patient did for homework to practice the skill. Patient discussed ways to continue using mindfulness throughout the day to manage stress and make more affective decisions. Patient practiced a ten minute mindfulness meditation Mortimer Fries) and was given access to the web site to use over the weekend.   Carman Ching, LCSW

## 2012-03-02 ENCOUNTER — Encounter (HOSPITAL_COMMUNITY): Payer: BC Managed Care – PPO | Admitting: Psychiatry

## 2012-03-02 DIAGNOSIS — F329 Major depressive disorder, single episode, unspecified: Secondary | ICD-10-CM

## 2012-03-02 DIAGNOSIS — F32A Depression, unspecified: Secondary | ICD-10-CM

## 2012-03-02 NOTE — Progress Notes (Signed)
    Daily Group Progress Note  Program: IOP  Group Time: 9:00-10:30 am   Participation Level: Active  Behavioral Response: Appropriate  Type of Therapy:  Process Group  Summary of Progress: Note was handwritten by Jennifer Brown, LPC and scanned into the EPIC system - refer to the Media section for this note     Group Time: 10:30 am - 12:00 pm   Participation Level:  Active  Behavioral Response: Appropriate  Type of Therapy: Psycho-education Group  Summary of Progress: Note was handwritten by Jennifer Brown, LPC and scanned into the EPIC system - refer to the Media section for this note  Avelardo Reesman E, LCSW 

## 2012-03-03 ENCOUNTER — Encounter (HOSPITAL_COMMUNITY): Payer: BC Managed Care – PPO

## 2012-03-03 ENCOUNTER — Telehealth (HOSPITAL_COMMUNITY): Payer: Self-pay | Admitting: Psychiatry

## 2012-03-04 ENCOUNTER — Encounter (HOSPITAL_COMMUNITY): Payer: BC Managed Care – PPO | Admitting: Psychiatry

## 2012-03-04 DIAGNOSIS — F32A Depression, unspecified: Secondary | ICD-10-CM

## 2012-03-04 DIAGNOSIS — F329 Major depressive disorder, single episode, unspecified: Secondary | ICD-10-CM

## 2012-03-04 NOTE — Progress Notes (Signed)
    Daily Group Progress Note  Program: IOP  Group Time: 9:00-10:30 am   Participation Level: Active  Behavioral Response: Appropriate  Type of Therapy:  Process Group  Summary of Progress: Patient is attending to her physical appearance and wearing clothes instead of pajamas. She had her hair down and styled and was wearing make-up. She shared how difficult it was for her to get out of bed this morning but how she thought of each group member and gained courage that propelled her to get up and attend the group today. She said the group is giving her hope for the future. She shared how tough on herself she is with negative thinking about failing in life and having unfulfilled expectations for herself and how she is trying to be kinder to herself and not let the judgment of others interfere with her self perception.     Group Time: 10:30 am - 12:00 pm   Participation Level:  Active  Behavioral Response: Appropriate  Type of Therapy: Psycho-education Group  Summary of Progress: Patient participated in a goodbye ceremony to a member ending the group today and practiced having healthy closure and communicating words of goodbye.   Carman Ching, LCSW

## 2012-03-05 ENCOUNTER — Encounter (HOSPITAL_COMMUNITY): Payer: BC Managed Care – PPO | Admitting: Psychiatry

## 2012-03-05 DIAGNOSIS — F329 Major depressive disorder, single episode, unspecified: Secondary | ICD-10-CM

## 2012-03-05 DIAGNOSIS — F32A Depression, unspecified: Secondary | ICD-10-CM

## 2012-03-05 NOTE — Progress Notes (Signed)
    Daily Group Progress Note  Program: IOP  Group Time: 9:00-10:30 am   Participation Level: Active  Behavioral Response: Appropriate  Type of Therapy:  Process Group  Summary of Progress: Patient reports continued progress with her depression. She is working on setting healthier boundaries with her daughter, who lives with patient, because patient feels she is overwhelmed by taking care of her and her children and she would like her daughter to take on more responsibility to take care of herself. Patient is doing well with identifying triggers to her depression.     Group Time: 10:30 am - 12:00 pm   Participation Level:  Active  Behavioral Response: Appropriate  Type of Therapy: Psycho-education Group  Summary of Progress: Patient learned about symptoms of anxiety, how to recognize them, causes and how "over caring impacts anxiety" and how to find a middle ground with the care level.   Carman Ching, LCSW

## 2012-03-06 ENCOUNTER — Encounter (HOSPITAL_COMMUNITY): Payer: BC Managed Care – PPO | Admitting: Psychiatry

## 2012-03-06 DIAGNOSIS — F329 Major depressive disorder, single episode, unspecified: Secondary | ICD-10-CM

## 2012-03-06 DIAGNOSIS — F32A Depression, unspecified: Secondary | ICD-10-CM

## 2012-03-06 NOTE — Progress Notes (Signed)
    Daily Group Progress Note  Program: IOP  Group Time: 9:00-10:30 am   Participation Level: Active  Behavioral Response: Appropriate  Type of Therapy:  Process Group  Summary of Progress: Patient reports a slight decrease in mood today and attributes it to not talking much in group yesterday. When she talks, her mood appears to improve. She talked about her primary stressors 1) moving at the end of February 2) Enabling her daughter and grandchildren 3) disliking her job. Paitent states she has not been sleeping well and that is making it difficult to explore options regarding her employment but she fears returning to work because her memory and concentration are not at a level where she feels she could do her job.      Group Time: 10:30 am - 12:00 pm   Participation Level:  Active  Behavioral Response: Appropriate  Type of Therapy: Psycho-education Group  Summary of Progress: Patient was introduced to the skill of distress tolerance and explored unhealthy ways they manage difficult emotions.  Carman Ching, LCSW

## 2012-03-06 NOTE — Progress Notes (Signed)
Patient ID: Deborah Williamson, female   DOB: [birth year], 51 y.o.   MRN: [id number] Patient reviewed and interviewed today, complains of feeling exhausted all the time and tired and difficulties remembering things with poor concentration. Processced  this and discussed the side effects of the medications especially the benzodiazepines that she is on this can affect memory. Patient also states that last week was her husbands birth today and she has been feeling down. Patient was retraumatized in 12-19-22 by the death of her brother-in-law. Discussed grieving the losses, also suggested going to warmer place to get out of the winter and patient has relatives in [place] but she thinks she'll go visit. Patient denies suicidal or homicidal ideation no hallucinations or delusions.

## 2012-03-09 ENCOUNTER — Encounter (HOSPITAL_COMMUNITY): Payer: BC Managed Care – PPO | Admitting: Psychiatry

## 2012-03-09 DIAGNOSIS — F32A Depression, unspecified: Secondary | ICD-10-CM

## 2012-03-09 DIAGNOSIS — F329 Major depressive disorder, single episode, unspecified: Secondary | ICD-10-CM

## 2012-03-09 MED ORDER — SERTRALINE HCL 100 MG PO TABS: 150.0000 mg | ORAL_TABLET | Freq: Every day | ORAL | Status: DC

## 2012-03-09 NOTE — Addendum Note (Signed)
Addended by: Margit Banda D on: 03/09/2012 11:31 AM   Modules accepted: Orders

## 2012-03-09 NOTE — Progress Notes (Signed)
    Daily Group Progress Note  Program: IOP  Group Time: 9:00-10:30 am   Participation Level: Minimal  Behavioral Response: Resistant  Type of Therapy:  Process Group  Summary of Progress: Patient was wearing pajamas and appeared disheveled with uncombed hair. She was distant and distracted and only talked when called upon. When completing her morning check in survey she at first refused to mark if she had thoughts of suicide until she was asked to answer the question. She said she spent the weekend in her bed and had thoughts about "not wanting to be here anymore". She said her job is her main stressor and she does not feel ready to go back to work and her mind races with worries constantly about fears of returning until her depression becomes high. Writer referred patient to the case manager and Dr. Karie Schwalbe. For assessment for inpatient hospitalization, which is writers recommendation at this time.      Group Time: 10:30 am - 12:00 pm   Participation Level:  Minimal  Behavioral Response: Resistant  Type of Therapy: Psycho-education Group  Summary of Progress: Patient was pulled to meet with the case manager and Dr. Karie Schwalbe for safety assessment while others participated in a grief and loss group facilitated by Theda Belfast and identified healthy ways to grieve personal losses.    Carman Ching, LCSW

## 2012-03-09 NOTE — Progress Notes (Signed)
Patient ID: Deborah Williamson, female   DOB: March 30, 1960, 51 y.o.   MRN: [id number] And patient and interviewed with [name]. Patient states that she does not feel good and has been feeling more anxious and dysphoric. No active suicidal ideation or homicidal ideation. No hallucinations or delusions. Sleep is good gets about 5 hours of sleep. Appetite is fair Tolerating medications well feels the medications are not helping her. Recommended increasing Zoloft 150 mg by mouth each bedtime.

## 2012-03-10 ENCOUNTER — Encounter (HOSPITAL_COMMUNITY): Payer: BC Managed Care – PPO | Admitting: Psychiatry

## 2012-03-10 DIAGNOSIS — F329 Major depressive disorder, single episode, unspecified: Secondary | ICD-10-CM

## 2012-03-10 DIAGNOSIS — F32A Depression, unspecified: Secondary | ICD-10-CM

## 2012-03-10 NOTE — Patient Instructions (Signed)
Patient completed MH-IOP today.  Follow up with Dr. Donell Beers on 03-13-12 @ 4:45 pm and Higinio Plan, LCSW on 03-16-12 @ 10 am.  Encouraged support groups.

## 2012-03-10 NOTE — Progress Notes (Signed)
Discharge Note  Patient:  Deborah Williamson is an 51 y.o., female DOB:  1961-01-22  Date of Admission:  12 /4/13  Date of Discharge: 03/10/12  Reason for Admission:depression and anxiety  Hospital Course:patient started IOP and was continued on her medications of Remeron SolTab 30 mg every morning, Zoloft 100 mg q. A.m. And Restoril 30 mg at bedtime. Patient complained of feeling tired to in the morning so her Remeron was switched to bedtime, patient talked about how she wanted to stop enabling her bipolar daughter and improve her self-esteem. She was very open in groups, had difficulty when it was her deceased husband's birthday and anniversary of her brother in law death. Patient with the help of the group was able to process as well. She was coping better although prior to the day of discharge she said she was very anxious. Encouragement was provided for her.   she felt that her medications were not helping her and so her Zoloft was increased to 150 mg by mouth daily. Patient tolerated the increase well and on the day of discharge was coping significantly better.  Mental Status at Discharge:alert, oriented x3, affect is appropriate mood is anxious, speech is normal. No suicidal or homicidal ideation. No hallucinations or delusions. Recent and remote memory is good, judgment and insight is good, concentration and recall are good. Patient is coping well and tolerating her medications well.  Lab Results: No results found for this or any previous visit (from the past 48 hour(s)).  Current outpatient prescriptions:aspirin 81 MG chewable tablet, Chew 1 tablet (81 mg total) by mouth daily., Disp: 30 tablet, Rfl: 0;  ibuprofen (ADVIL,MOTRIN) 600 MG tablet, Take 1 tablet (600 mg total) by mouth every 6 (six) hours as needed (headache)., Disp: 20 tablet, Rfl: 0;  mirtazapine (REMERON SOL-TAB) 30 MG disintegrating tablet, Take 30 mg by mouth at bedtime., Disp: , Rfl:  sertraline (ZOLOFT) 100 MG tablet, Take  1.5 tablets (150 mg total) by mouth daily., Disp: 30 tablet, Rfl: 0;  temazepam (RESTORIL) 15 MG capsule, Take 15 mg by mouth at bedtime as needed., Disp: , Rfl:   Axis Diagnosis:   Axis I: Anxiety Disorder NOS and Major Depression, Recurrent severe Axis II: Cluster B Traits Axis III:  Past Medical History  Diagnosis Date  . Hypertension   . Headache   . Depression   . Anxiety    Axis IV: economic problems, occupational problems, problems related to social environment and problems with primary support group Axis V: 61-70 mild symptoms   Level of Care:  IOP  Discharge destination:  Home  Is patient on multiple antipsychotic therapies at discharge:  No    Has Patient had three or more failed trials of antipsychotic monotherapy by history:  No  Patient phone:  [phone] (home)  Patient address:   [address] [place] [address],   Follow-up recommendations:  Activity:  as tolerated Diet:  regular Other:  followup with Dr. Donell Beers  for medications and Almond Lint  for therapy  Comments:    The patient received suicide prevention pamphlet:  Yes Belongings returned:    Margit Banda 03/10/2012, 12:46 PM

## 2012-03-10 NOTE — Progress Notes (Signed)
Patient ID: Deborah Williamson, female   DOB: 12/16/1960, 51 y.o.   MRN: [id number] D:THIS IS A 51 YEAR OLD WIDOWED AFRICAN AMERICAN FEMALE, WHO WAS REFERRED PER DR. Donell Beers. PT PRESENTS WITH DEPRESSION, NOT GETTING OUT OF BED TO GET DRESSED, NOT EATING AND NOT SLEEPING UNLESS TAKING MEDICATIONS. TRIGGERS/STRESSORS: 1) HER HUSBAND DIED OF A HEART ATTACK 3 YEARS AGO IN OCTOBER . PT HAD BEEN WORKING AND DOING FAIRLY WELL WITH GRIEVING, UNTIL HER BROTHER-IN-LAW GOT KILLED IN A CAR ACCIDENT (SEPTEMBER 2013) WHICH TRIGGERED HER GRIEF AND DEPRESSED FEELINGS AGAIN. PT HAS CRYING SPELLS, UNABLE TO FUNCTION NOR CONCENTRATE, IS SAD AND IS NOT CARING FOR HERSELF AS SHE SHOULD. SHE DENIES S/I, H/I AND IS NOT PSYCHOTIC. SHE REPORTED ONE PRIOR SUICIDE ATTEMPT AT AGE 89 WHICH REQUIRED AN INPATIENT STAY AT [facility]. PT'S TOPAMAX ANS SEROQUEL WAS D/C'D BY DR PLOVSKY AND PT WAS STARTED ON ZOLOFT 100MG  QD AND REMERON 30MG  QD 2 WEEKS AGO.  PT COMPLETED MH-IOP TODAY.  REPORTS CONTINUED DEPRESSIVE AND ANXIETY SYMPTOMS.  DENIES ANY SI/HI OR A/V HALLUCINATIONS.  C/O POOR SLEEP, APPETITE, ENERGY LEVEL, ALONG WITH CONCENTRATION. ALTHOUGH PT STATES THE GROUPS WERE HELPFUL, SHE STATES SHE DOESN'T FEEL ANY BETTER.  DISCUSSED INPATIENT HOSPITALIZATION, BUT PT DECLINED.  A:  D/C TODAY.  F/U WITH DR. Donell Beers ON 03-13-12 @ 4:15 PM AND LYN NORRIS, LCSW ON 03-16-12 AT 10 A.M..  ENCOURAGED SUPPORT GROUPS.  R:  PT RECEPTIVE.

## 2012-03-11 ENCOUNTER — Encounter (HOSPITAL_COMMUNITY): Payer: BC Managed Care – PPO

## 2012-03-11 NOTE — Progress Notes (Signed)
    Daily Group Progress Note  Program: IOP  Group Time: 9:00-10:30 am   Participation Level: Active  Behavioral Response: Appropriate  Type of Therapy:  Process Group  Summary of Progress: Today was patient final day in the group. She intially entered the group to address grief and loss issues pertaining to the death of her husband three years ago and more recently the death of her brother-in-law. She talked mostly throughout her time in the program about how her job is her main depression trigger and she knows she can't return to her current place of employment. She is making arrangements to have her outpatient doctor write her off longer. Her depression appears more manageable when she does not have the pending pressure to return to her job. She was wearing clothes today instead of pajamas and appeared ready for discharge. She said her depression has lessoned since starting the program and she feels more in control of her life.      Group Time: 10:30 am - 12:00 pm   Participation Level:  Active  Behavioral Response: Appropriate  Type of Therapy: Psycho-education Group  Summary of Progress: Patient learned part of the DBT skill of distress tolerance and how to identify if an anxiety trigger can be fixed how to fix it.  Carman Ching, LCSW

## 2012-03-12 ENCOUNTER — Encounter (HOSPITAL_COMMUNITY): Payer: BC Managed Care – PPO

## 2012-03-13 ENCOUNTER — Encounter (HOSPITAL_COMMUNITY): Payer: BC Managed Care – PPO

## 2012-03-16 ENCOUNTER — Encounter (HOSPITAL_COMMUNITY): Payer: BC Managed Care – PPO

## 2012-03-17 ENCOUNTER — Encounter (HOSPITAL_COMMUNITY): Payer: BC Managed Care – PPO

## 2012-03-18 ENCOUNTER — Encounter (HOSPITAL_COMMUNITY): Payer: BC Managed Care – PPO

## 2012-03-19 ENCOUNTER — Encounter (HOSPITAL_COMMUNITY): Payer: BC Managed Care – PPO

## 2012-03-20 ENCOUNTER — Encounter (HOSPITAL_COMMUNITY): Payer: BC Managed Care – PPO

## 2012-03-23 ENCOUNTER — Encounter (HOSPITAL_COMMUNITY): Payer: BC Managed Care – PPO

## 2012-03-24 ENCOUNTER — Encounter (HOSPITAL_COMMUNITY): Payer: BC Managed Care – PPO

## 2012-10-08 ENCOUNTER — Other Ambulatory Visit (HOSPITAL_COMMUNITY)
Admission: RE | Admit: 2012-10-08 | Discharge: 2012-10-08 | Disposition: A | Discharge status: Home / Self Care | Payer: BC Managed Care – PPO | Source: Ambulatory Visit | Attending: Physician Assistant | Admitting: Physician Assistant

## 2012-10-08 ENCOUNTER — Other Ambulatory Visit: Payer: Self-pay | Admitting: Physician Assistant

## 2012-10-08 DIAGNOSIS — Z113 Encounter for screening for infections with a predominantly sexual mode of transmission: Secondary | ICD-10-CM | POA: Insufficient documentation

## 2012-10-08 DIAGNOSIS — Z0000 Encounter for general adult medical examination without abnormal findings: Secondary | ICD-10-CM | POA: Insufficient documentation

## 2012-10-12 ENCOUNTER — Other Ambulatory Visit: Payer: Self-pay

## 2012-10-12 DIAGNOSIS — Z1231 Encounter for screening mammogram for malignant neoplasm of breast: Secondary | ICD-10-CM

## 2012-11-10 ENCOUNTER — Ambulatory Visit
Admission: RE | Admit: 2012-11-10 | Discharge: 2012-11-10 | Disposition: A | Discharge status: Home / Self Care | Payer: BC Managed Care – PPO | Source: Ambulatory Visit

## 2012-11-10 DIAGNOSIS — Z1231 Encounter for screening mammogram for malignant neoplasm of breast: Secondary | ICD-10-CM

## 2012-11-16 ENCOUNTER — Other Ambulatory Visit: Payer: Self-pay | Admitting: Physician Assistant

## 2012-11-16 DIAGNOSIS — R928 Other abnormal and inconclusive findings on diagnostic imaging of breast: Secondary | ICD-10-CM

## 2012-11-27 ENCOUNTER — Ambulatory Visit
Admission: RE | Admit: 2012-11-27 | Discharge: 2012-11-27 | Disposition: A | Discharge status: Home / Self Care | Payer: BC Managed Care – PPO | Source: Ambulatory Visit | Attending: Physician Assistant | Admitting: Physician Assistant

## 2012-11-27 DIAGNOSIS — R928 Other abnormal and inconclusive findings on diagnostic imaging of breast: Secondary | ICD-10-CM

## 2012-12-01 ENCOUNTER — Other Ambulatory Visit: Payer: Self-pay

## 2013-01-23 DIAGNOSIS — I7121 Aneurysm of the ascending aorta, without rupture: Secondary | ICD-10-CM

## 2013-01-23 DIAGNOSIS — I5181 Takotsubo syndrome: Secondary | ICD-10-CM

## 2013-01-23 DIAGNOSIS — I712 Thoracic aortic aneurysm, without rupture: Secondary | ICD-10-CM

## 2013-01-23 DIAGNOSIS — N83202 Unspecified ovarian cyst, left side: Secondary | ICD-10-CM

## 2013-01-23 HISTORY — DX: Thoracic aortic aneurysm, without rupture: I71.2

## 2013-01-23 HISTORY — DX: Aneurysm of the ascending aorta, without rupture: I71.21

## 2013-01-23 HISTORY — DX: Takotsubo syndrome: I51.81

## 2013-01-23 HISTORY — DX: Unspecified ovarian cyst, left side: N83.202

## 2013-02-16 ENCOUNTER — Encounter (HOSPITAL_COMMUNITY): Payer: Self-pay | Admitting: Emergency Medicine

## 2013-02-16 ENCOUNTER — Emergency Department (HOSPITAL_COMMUNITY): Payer: BC Managed Care – PPO

## 2013-02-16 ENCOUNTER — Inpatient Hospital Stay (HOSPITAL_COMMUNITY)
Admission: EM | Admit: 2013-02-16 | Discharge: 2013-02-18 | DRG: 281 | Disposition: A | Discharge status: Home / Self Care | Payer: BC Managed Care – PPO | Attending: Cardiology | Admitting: Cardiology

## 2013-02-16 DIAGNOSIS — F32A Depression, unspecified: Secondary | ICD-10-CM

## 2013-02-16 DIAGNOSIS — I5181 Takotsubo syndrome: Secondary | ICD-10-CM

## 2013-02-16 DIAGNOSIS — F329 Major depressive disorder, single episode, unspecified: Secondary | ICD-10-CM

## 2013-02-16 DIAGNOSIS — N83209 Unspecified ovarian cyst, unspecified side: Secondary | ICD-10-CM | POA: Diagnosis present

## 2013-02-16 DIAGNOSIS — N83202 Unspecified ovarian cyst, left side: Secondary | ICD-10-CM

## 2013-02-16 DIAGNOSIS — I712 Thoracic aortic aneurysm, without rupture, unspecified: Secondary | ICD-10-CM | POA: Diagnosis present

## 2013-02-16 DIAGNOSIS — I214 Non-ST elevation (NSTEMI) myocardial infarction: Principal | ICD-10-CM | POA: Diagnosis present

## 2013-02-16 DIAGNOSIS — I428 Other cardiomyopathies: Secondary | ICD-10-CM

## 2013-02-16 DIAGNOSIS — D649 Anemia, unspecified: Secondary | ICD-10-CM

## 2013-02-16 DIAGNOSIS — R079 Chest pain, unspecified: Secondary | ICD-10-CM

## 2013-02-16 DIAGNOSIS — I252 Old myocardial infarction: Secondary | ICD-10-CM | POA: Diagnosis present

## 2013-02-16 DIAGNOSIS — I7121 Aneurysm of the ascending aorta, without rupture: Secondary | ICD-10-CM

## 2013-02-16 DIAGNOSIS — I10 Essential (primary) hypertension: Secondary | ICD-10-CM | POA: Diagnosis present

## 2013-02-16 DIAGNOSIS — Z9889 Other specified postprocedural states: Secondary | ICD-10-CM

## 2013-02-16 DIAGNOSIS — Z7982 Long term (current) use of aspirin: Secondary | ICD-10-CM

## 2013-02-16 HISTORY — DX: Non-ST elevation (NSTEMI) myocardial infarction: I21.4

## 2013-02-16 HISTORY — DX: Anemia, unspecified: D64.9

## 2013-02-16 HISTORY — DX: Thoracic aortic aneurysm, without rupture: I71.2

## 2013-02-16 HISTORY — DX: Takotsubo syndrome: I51.81

## 2013-02-16 HISTORY — DX: Unspecified ovarian cyst, left side: N83.202

## 2013-02-16 HISTORY — DX: Cardiac arrhythmia, unspecified: I49.9

## 2013-02-16 HISTORY — DX: Migraine, unspecified, not intractable, without status migrainosus: G43.909

## 2013-02-16 LAB — POCT I-STAT TROPONIN I: Troponin i, poc: 0.72 ng/mL (ref 0.00–0.08)

## 2013-02-16 LAB — COMPREHENSIVE METABOLIC PANEL
Albumin: 3.4 g/dL — ABNORMAL LOW (ref 3.5–5.2)
Alkaline Phosphatase: 66 U/L (ref 39–117)
BUN: 9 mg/dL (ref 6–23)
Calcium: 8.9 mg/dL (ref 8.4–10.5)
Creatinine, Ser: 0.63 mg/dL (ref 0.50–1.10)
Potassium: 3.8 mEq/L (ref 3.5–5.1)
Total Protein: 7.3 g/dL (ref 6.0–8.3)

## 2013-02-16 LAB — PROTIME-INR
INR: 1.1 (ref 0.00–1.49)
Prothrombin Time: 14 seconds (ref 11.6–15.2)

## 2013-02-16 LAB — CBC
HCT: 35.6 % — ABNORMAL LOW (ref 36.0–46.0)
MCHC: 34 g/dL (ref 30.0–36.0)
RDW: 13.5 % (ref 11.5–15.5)

## 2013-02-16 LAB — HEPARIN LEVEL (UNFRACTIONATED): Heparin Unfractionated: 0.48 IU/mL (ref 0.30–0.70)

## 2013-02-16 MED ORDER — IOHEXOL 350 MG/ML SOLN
100.0000 mL | Freq: Once | INTRAVENOUS | Status: AC | PRN
  Administered 2013-02-16: 100 mL via INTRAVENOUS

## 2013-02-16 MED ORDER — ONDANSETRON HCL 4 MG/2ML IJ SOLN
4.0000 mg | Freq: Once | INTRAMUSCULAR | Status: AC
  Administered 2013-02-16: 4 mg via INTRAVENOUS
  Filled 2013-02-16: qty 2

## 2013-02-16 MED ORDER — SODIUM CHLORIDE 0.9 % IV SOLN: 250.0000 mL | INTRAVENOUS | Status: DC | PRN

## 2013-02-16 MED ORDER — SODIUM CHLORIDE 0.9 % IJ SOLN
3.0000 mL | Freq: Two times a day (BID) | INTRAMUSCULAR | Status: DC
  Administered 2013-02-17 (×2): 3 mL via INTRAVENOUS

## 2013-02-16 MED ORDER — MORPHINE SULFATE 4 MG/ML IJ SOLN
INTRAMUSCULAR | Status: AC
  Administered 2013-02-16: 4 mg
  Filled 2013-02-16: qty 1

## 2013-02-16 MED ORDER — HEPARIN BOLUS VIA INFUSION
4000.0000 [IU] | Freq: Once | INTRAVENOUS | Status: AC
  Administered 2013-02-16: 4000 [IU] via INTRAVENOUS
  Filled 2013-02-16: qty 4000

## 2013-02-16 MED ORDER — ACETAMINOPHEN 325 MG PO TABS
650.0000 mg | ORAL_TABLET | ORAL | Status: DC | PRN
  Administered 2013-02-17: 09:00:00 650 mg via ORAL
  Filled 2013-02-16: qty 2

## 2013-02-16 MED ORDER — MORPHINE SULFATE 4 MG/ML IJ SOLN
4.0000 mg | Freq: Once | INTRAMUSCULAR | Status: AC
  Administered 2013-02-16: 4 mg via INTRAVENOUS
  Filled 2013-02-16: qty 1

## 2013-02-16 MED ORDER — PANTOPRAZOLE SODIUM 40 MG PO TBEC
40.0000 mg | DELAYED_RELEASE_TABLET | Freq: Every day | ORAL | Status: DC
  Administered 2013-02-17 – 2013-02-18 (×2): 40 mg via ORAL
  Filled 2013-02-16 (×2): qty 1

## 2013-02-16 MED ORDER — ONDANSETRON HCL 4 MG/2ML IJ SOLN
4.0000 mg | Freq: Four times a day (QID) | INTRAMUSCULAR | Status: DC | PRN
  Administered 2013-02-17 – 2013-02-18 (×2): 4 mg via INTRAVENOUS
  Filled 2013-02-16 (×2): qty 2

## 2013-02-16 MED ORDER — METOPROLOL TARTRATE 12.5 MG HALF TABLET
12.5000 mg | ORAL_TABLET | Freq: Two times a day (BID) | ORAL | Status: DC
  Administered 2013-02-16 – 2013-02-18 (×4): 12.5 mg via ORAL
  Filled 2013-02-16 (×5): qty 1

## 2013-02-16 MED ORDER — ONDANSETRON HCL 4 MG/2ML IJ SOLN
INTRAMUSCULAR | Status: AC
  Administered 2013-02-16: 4 mg
  Filled 2013-02-16: qty 2

## 2013-02-16 MED ORDER — HEPARIN (PORCINE) IN NACL 100-0.45 UNIT/ML-% IJ SOLN
1250.0000 [IU]/h | INTRAMUSCULAR | Status: DC
  Administered 2013-02-16: 1100 [IU]/h via INTRAVENOUS
  Filled 2013-02-16 (×2): qty 250

## 2013-02-16 MED ORDER — KETOROLAC TROMETHAMINE 30 MG/ML IJ SOLN
30.0000 mg | Freq: Once | INTRAMUSCULAR | Status: AC
  Administered 2013-02-16: 30 mg via INTRAVENOUS
  Filled 2013-02-16: qty 1

## 2013-02-16 MED ORDER — ATORVASTATIN CALCIUM 80 MG PO TABS
80.0000 mg | ORAL_TABLET | Freq: Every day | ORAL | Status: DC
  Administered 2013-02-16 – 2013-02-17 (×2): 80 mg via ORAL
  Filled 2013-02-16 (×4): qty 1

## 2013-02-16 MED ORDER — HEART ATTACK BOUNCING BOOK
Freq: Once | Status: DC
  Filled 2013-02-16: qty 1

## 2013-02-16 MED ORDER — SODIUM CHLORIDE 0.9 % IJ SOLN: 3.0000 mL | INTRAMUSCULAR | Status: DC | PRN

## 2013-02-16 MED ORDER — ASPIRIN 81 MG PO CHEW: 324.0000 mg | CHEWABLE_TABLET | Freq: Once | ORAL | Status: DC

## 2013-02-16 MED ORDER — NITROGLYCERIN 0.4 MG SL SUBL: 0.4000 mg | SUBLINGUAL_TABLET | SUBLINGUAL | Status: DC | PRN

## 2013-02-16 MED ORDER — ASPIRIN EC 81 MG PO TBEC
81.0000 mg | DELAYED_RELEASE_TABLET | Freq: Every day | ORAL | Status: DC
  Administered 2013-02-18: 10:00:00 81 mg via ORAL
  Filled 2013-02-16 (×2): qty 1

## 2013-02-16 NOTE — ED Notes (Signed)
NOTIFIED DR. GLICK IN PERSON OF PANIC LAB RESULTS OF TROPONIN = 0.72ng/ml, @ 15:10pm ,02/16/2013.

## 2013-02-16 NOTE — Progress Notes (Signed)
ANTICOAGULATION CONSULT NOTE  Pharmacy Consult for heparin Indication: chest pain/ACS  No Known Allergies  Patient Measurements: weight=109 kg,  Height= 66 inches (per patient) Height: 5\' 6"  (167.6 cm) Weight: 240 lb (108.863 kg) IBW/kg (Calculated) : 59.3 Heparin Dosing Weight: 84 kg  Vital Signs: Temp: 98.2 F (36.8 C) (11/25 1358) Temp src: Oral (11/25 1358) BP: 116/83 mmHg (11/25 2130) Pulse Rate: 65 (11/25 2130)  Labs:  Recent Labs  02/16/13 1420 02/16/13 1812 02/16/13 2137  HGB 12.1  --   --   HCT 35.6*  --   --   PLT 270  --   --   LABPROT  --  14.0  --   INR  --  1.10  --   HEPARINUNFRC  --   --  0.48  CREATININE 0.63  --   --   TROPONINI  --  3.80*  --     Estimated Creatinine Clearance: 103.9 ml/min (by C-G formula based on Cr of 0.63).   Medical History: Past Medical History  Diagnosis Date  . Hypertension   . Headache(784.0)   . Depression   . Anxiety     Medications:  See med rec  Assessment: Patient is a 52 y.o F presented to the ED with c/o CP and now with elevated troponin.  To start heparin for ACS. Patient stated that she's not on any blood thinners PTA and no bleeding issues.  Initial heparin level = 0.48  Goal of Therapy:  Heparin level 0.3-0.7 units/ml Monitor platelets by anticoagulation protocol: Yes   Plan:  1) Continue heparin drip at 1100 units / hr 2) Follow up AM labs  Thank you. Elwin Sleight 02/16/2013,10:29 PM

## 2013-02-16 NOTE — ED Notes (Signed)
CT made aware of IV access. 

## 2013-02-16 NOTE — ED Notes (Signed)
IV inserted in right upper arm by MD via ultrasound.

## 2013-02-16 NOTE — ED Notes (Signed)
Pt returned from x-ray, st's pain is worse now.

## 2013-02-16 NOTE — ED Notes (Signed)
IV team at bedside attempting IV access

## 2013-02-16 NOTE — ED Notes (Signed)
Pt to CT at this time.  Pt st's pain has improved.  Rates pain #5 on pain scale 0/10

## 2013-02-16 NOTE — ED Notes (Signed)
Unable to obtain IV for CT.  IV team called for same.

## 2013-02-16 NOTE — ED Provider Notes (Signed)
CSN: [id number]     Arrival date & time 02/16/13  1352 History   First MD Initiated Contact with Patient 02/16/13 1353     Chief Complaint  Patient presents with  . Chest Pain   (Consider location/radiation/quality/duration/timing/severity/associated sxs/prior Treatment) Patient is a 52 y.o. female presenting with chest pain. The history is provided by the patient. No language interpreter was used.  Chest Pain Associated symptoms: nausea     This is a 52 year old female who presents emergency department with chief complaint of chest pain.  Patient states she was at her mother's house this morning she began having rectal sudden onset right-sided chest pain.  Said at that time it was mild, nonradiating.  Patient was massaging her chest to make it go away.  She states that she went to the store and had worsening chest pain, nausea and shortness of breath.  All right-sided.  She sat down hoping it would go away.  States worse pain she's ever had in her life.  She denies any radiation.  Her pain is made worse by palpation of the chest and movement of the arm.  She has a distant history of hypertension that was once treated however she was removed from medication as her her pressures normalized.  Patient was given aspirin and 2 doses of nitroglycerin.  She is moderately for her symptoms at that time.  The patient is a nonsmoker, nondiabetic, no family history of heart disease.  She denies any upper respirations symptoms. Denies fevers, chills, myalgias, arthralgias. Denies dysuria, flank pain, suprapubic pain, frequency, urgency, or hematuria. Denies headaches, light headedness, weakness, visual disturbances. Denies abdominal pain, nausea, vomiting, diarrhea or constipation.    Past Medical History  Diagnosis Date  . Hypertension   . Headache(784.0)   . Depression   . Anxiety    Past Surgical History  Procedure Laterality Date  . Arm sugery  Both     Plastic Surgery to remove loose skin after  Weight Loss   Family History  Problem Relation Age of Onset  . Hypertension Mother   . Breast cancer Mother   . Diabetes Paternal Grandmother   . Stroke Paternal Grandmother   . Breast cancer Maternal Aunt   . Prostate cancer Maternal Uncle   . Prostate cancer Maternal Uncle   . Depression Sister   . Bipolar disorder Daughter    History  Substance Use Topics  . Smoking status: Never Smoker   . Smokeless tobacco: Not on file  . Alcohol Use: No   OB History   Grav Para Term Preterm Abortions TAB SAB Ect Mult Living                 Review of Systems  Respiratory: Positive for chest tightness.   Cardiovascular: Positive for chest pain.  Gastrointestinal: Positive for nausea.  All other systems reviewed and are negative.    Allergies  Review of patient's allergies indicates no known allergies.  Home Medications   Current Outpatient Rx  Name  Route  Sig  Dispense  Refill  . aspirin 81 MG chewable tablet   Oral   Chew 1 tablet (81 mg total) by mouth daily.   30 tablet   0   . ibuprofen (ADVIL,MOTRIN) 600 MG tablet   Oral   Take 1 tablet (600 mg total) by mouth every 6 (six) hours as needed (headache).   20 tablet   0   . mirtazapine (REMERON SOL-TAB) 30 MG disintegrating tablet   Oral  Take 30 mg by mouth at bedtime.         . sertraline (ZOLOFT) 100 MG tablet   Oral   Take 1.5 tablets (150 mg total) by mouth daily.   30 tablet   0   . temazepam (RESTORIL) 15 MG capsule   Oral   Take 15 mg by mouth at bedtime as needed.          BP 149/129  Pulse 79  Temp(Src) 98.2 F (36.8 C) (Oral)  Resp 18  SpO2 100% Physical Exam  Constitutional: She is oriented to person, place, and time. She appears well-developed and well-nourished.  Anxious and tearful.  HENT:  Head: Normocephalic and atraumatic.  Eyes: Conjunctivae are normal. No scleral icterus.  Neck: Normal range of motion.  Cardiovascular: Normal rate, regular rhythm and normal heart sounds.   Exam reveals no gallop and no friction rub.   No murmur heard. Pulmonary/Chest: Effort normal and breath sounds normal. No respiratory distress. She exhibits tenderness (Exquisitely tender to palpation in the right chest wall anteriorly.  Patient states this is the same as her pain complaint but prior to ED.).  Abdominal: Soft. Bowel sounds are normal. She exhibits no distension and no mass. There is no tenderness. There is no guarding.  Neurological: She is alert and oriented to person, place, and time.  Skin: Skin is warm and dry. She is not diaphoretic.    ED Course  Procedures (including critical care time) Labs Review Labs Reviewed  CBC  COMPREHENSIVE METABOLIC PANEL   Imaging Review No results found.  EKG Interpretation    Date/Time:  Tuesday February 16 2013 14:02:08 EST Ventricular Rate:  75 PR Interval:    QRS Duration: 90 QT Interval:  452 QTC Calculation: 505 R Axis:   25 Text Interpretation:  normal sinus Nonspecific T abnormalities, lateral leads Borderline prolonged QT interval Confirmed by Manus Gunning  MD, STEPHEN ([id number]) on 02/16/2013 2:14:36 PM            MDM  No diagnosis found. 2:27 PM BP 149/129  Pulse 79  Temp(Src) 98.2 F (36.8 C) (Oral)  Resp 18  SpO2 100% Patient with reproducible CP. ACS workup initiated.     Patient with elevated i-stat troponin. No signs of acute ischemia on ekg. Heparin initiated. Regular troponin pending. I have GIVEN THE PATIENT in hand off to Dr. Tomasa Blase will assume care.    Arthor Captain, PA-C 02/19/13 1452

## 2013-02-16 NOTE — ED Notes (Signed)
Pt to CT at this time.

## 2013-02-16 NOTE — ED Notes (Signed)
Family in room  

## 2013-02-16 NOTE — ED Notes (Signed)
Pt c/o headache, requesting medication for same.

## 2013-02-16 NOTE — ED Notes (Signed)
Pt in x-ray at this time

## 2013-02-16 NOTE — ED Provider Notes (Signed)
Assumed care from Arthor Captain at 1630.  Please see her note for full information.  In short, the patient is a 52 year old female who presents to the emergency department with R sided chest pain x 1 hour.  Easily reproducible on exam. ASA administered prehospital.  CT dissection study done and negative.  Troponin positive on i-stat but given false positive rate was rechecked in normal lab.  In mean time, heparin drip initiated.  Repeat troponin positive at 3.80.  Cardiology consulted for NSTEMI management.  Patient placed in stepdown.  Safe for admission.  Patient admitted chest pain free.  1. NSTEMI (non-ST elevated myocardial infarction)      Arloa Koh, MD 02/16/13 2341

## 2013-02-16 NOTE — Progress Notes (Signed)
ANTICOAGULATION CONSULT NOTE - Initial Consult  Pharmacy Consult for heparin Indication: chest pain/ACS  No Known Allergies  Patient Measurements: weight=109 kg,  Height= 66 inches (per patient)   Heparin Dosing Weight: 84 kg  Vital Signs: Temp: 98.2 F (36.8 C) (11/25 1358) Temp src: Oral (11/25 1358) BP: 139/95 mmHg (11/25 1430) Pulse Rate: 78 (11/25 1430)  Labs:  Recent Labs  02/16/13 1420  HGB 12.1  HCT 35.6*  PLT 270  CREATININE 0.63    The CrCl is unknown because both a height and weight (above a minimum accepted value) are required for this calculation.   Medical History: Past Medical History  Diagnosis Date  . Hypertension   . Headache(784.0)   . Depression   . Anxiety     Medications:  See med rec  Assessment: Patient is a 52 y.o F presented to the ED with c/o CP and now with elevated troponin.  To start heparin for ACS. Patient stated that she's not on any blood thinners PTA and no bleeding issues.   Goal of Therapy:  Heparin level 0.3-0.7 units/ml Monitor platelets by anticoagulation protocol: Yes   Plan:  1) heparin 4000 units IV x1 bolus, then drip at 1100 units/hr 2) check 6 hour heparin level  Ilijah Doucet P 02/16/2013,3:51 PM

## 2013-02-16 NOTE — ED Notes (Signed)
Pt to department via EMS- pt reports chest pain that started about an hour ago. Reports that she was resting when the pain started. Worse with palpation and movement. Pt reports some nausea and SOB. Pt given 324 ASA, 2 nitro and 4 zofran. No cardiac hx. Bp-140/90 Hr-82 RR-16 o2-100

## 2013-02-16 NOTE — ED Notes (Signed)
Received call from CT stating that they flushed pt's IV with saline and it infiltrated.  Pt being brought back from CT.  Dr. Preston Fleeting made aware, st's they will attempt IV access with ultrasound.

## 2013-02-16 NOTE — H&P (Addendum)
Deborah Williamson is an 52 y.o. female.   Chief Complaint: chest pain HPI: Deborah Williamson is a 52 yo woman with PMH of hypertension who presents with chest pain that began this morning. She characterized the pain as right-sided, pressure sensation with some associated nausea and shortness of breath. She thought it was indigestion but it didn't improve with tums or going shopping. She felt weak and as if she couldn't walk although she typically has no problems with stairs and walks 30 minutes continuously in her [place] several days per week. In the ER she had an elevated troponin and a CTA negative for dissection. She didn't focus on telling me but she told the ER team and correlated the story when I asked her if the pain radiated to her back. Aspirin 324 mg and heparin gtt started in the ER. We discussed likely ramifications and likely LHC In AM.    Past Medical History  Diagnosis Date  . Hypertension   . Headache(784.0)   . Depression   . Anxiety     Past Surgical History  Procedure Laterality Date  . Arm sugery  Both     Plastic Surgery to remove loose skin after Weight Loss    Family History  Problem Relation Age of Onset  . Hypertension Mother   . Breast cancer Mother   . Diabetes Paternal Grandmother   . Stroke Paternal Grandmother   . Breast cancer Maternal Aunt   . Prostate cancer Maternal Uncle   . Prostate cancer Maternal Uncle   . Depression Sister   . Bipolar disorder Daughter    Social History:  reports that she has never smoked. She does not have any smokeless tobacco history on file. She reports that she does not drink alcohol or use illicit drugs.  Allergies: No Known Allergies  Medications Prior to Admission  Medication Sig Dispense Refill  . aspirin 325 MG tablet Take 325 mg by mouth daily as needed for headache.      . Calcium Carbonate Antacid (TUMS PO) Take 2 tablets by mouth daily as needed (heartburn).      Marland Kitchen omeprazole (PRILOSEC) 10 MG capsule Take 10 mg by  mouth daily as needed (heartburn).        Results for orders placed during the hospital encounter of 02/16/13 (from the past 48 hour(s))  CBC     Status: Abnormal   Collection Time    02/16/13  2:20 PM      Result Value Range   WBC 6.4  4.0 - 10.5 K/uL   RBC 4.38  3.87 - 5.11 MIL/uL   Hemoglobin 12.1  12.0 - 15.0 g/dL   HCT 91.4 (*) 78.2 - 95.6 %   MCV 81.3  78.0 - 100.0 fL   MCH 27.6  26.0 - 34.0 pg   MCHC 34.0  30.0 - 36.0 g/dL   RDW 21.3  08.6 - 57.8 %   Platelets 270  150 - 400 K/uL  COMPREHENSIVE METABOLIC PANEL     Status: Abnormal   Collection Time    02/16/13  2:20 PM      Result Value Range   Sodium 134 (*) 135 - 145 mEq/L   Potassium 3.8  3.5 - 5.1 mEq/L   Chloride 100  96 - 112 mEq/L   CO2 25  19 - 32 mEq/L   Glucose, Bld 98  70 - 99 mg/dL   BUN 9  6 - 23 mg/dL   Creatinine, Ser 4.69  0.50 -  1.10 mg/dL   Calcium 8.9  8.4 - 86.5 mg/dL   Total Protein 7.3  6.0 - 8.3 g/dL   Albumin 3.4 (*) 3.5 - 5.2 g/dL   AST 17  0 - 37 U/L   ALT 7  0 - 35 U/L   Alkaline Phosphatase 66  39 - 117 U/L   Total Bilirubin 0.4  0.3 - 1.2 mg/dL   GFR calc non Af Amer >90  >90 mL/min   GFR calc Af Amer >90  >90 mL/min   Comment: (NOTE)     The eGFR has been calculated using the CKD EPI equation.     This calculation has not been validated in all clinical situations.     eGFR's persistently <90 mL/min signify possible Chronic Kidney     Disease.  POCT I-STAT TROPONIN I     Status: Abnormal   Collection Time    02/16/13  2:58 PM      Result Value Range   Troponin i, poc 0.72 (*) 0.00 - 0.08 ng/mL   Comment NOTIFIED PHYSICIAN     Comment 3            Comment: Due to the release kinetics of cTnI,     a negative result within the first hours     of the onset of symptoms does not rule out     myocardial infarction with certainty.     If myocardial infarction is still suspected,     repeat the test at appropriate intervals.  PROTIME-INR     Status: None   Collection Time    02/16/13   6:12 PM      Result Value Range   Prothrombin Time 14.0  11.6 - 15.2 seconds   INR 1.10  0.00 - 1.49  TROPONIN I     Status: Abnormal   Collection Time    02/16/13  6:12 PM      Result Value Range   Troponin I 3.80 (*) <0.30 ng/mL   Comment:            Due to the release kinetics of cTnI,     a negative result within the first hours     of the onset of symptoms does not rule out     myocardial infarction with certainty.     If myocardial infarction is still suspected,     repeat the test at appropriate intervals.     REPEATED TO VERIFY     CRITICAL RESULT CALLED TO, READ BACK BY AND VERIFIED WITH:     Tyna Jaksch 1931 02/16/13 D BRADLEY  D-DIMER, QUANTITATIVE     Status: Abnormal   Collection Time    02/16/13  6:12 PM      Result Value Range   D-Dimer, Quant 0.73 (*) 0.00 - 0.48 ug/mL-FEU   Comment:            AT THE INHOUSE ESTABLISHED CUTOFF     VALUE OF 0.48 ug/mL FEU,     THIS ASSAY HAS BEEN DOCUMENTED     IN THE LITERATURE TO HAVE     A SENSITIVITY AND NEGATIVE     PREDICTIVE VALUE OF AT LEAST     98 TO 99%.  THE TEST RESULT     SHOULD BE CORRELATED WITH     AN ASSESSMENT OF THE CLINICAL     PROBABILITY OF DVT / VTE.  HEPARIN LEVEL (UNFRACTIONATED)     Status: None   Collection Time  02/16/13  9:37 PM      Result Value Range   Heparin Unfractionated 0.48  0.30 - 0.70 IU/mL   Comment:            IF HEPARIN RESULTS ARE BELOW     EXPECTED VALUES, AND PATIENT     DOSAGE HAS BEEN CONFIRMED,     SUGGEST FOLLOW UP TESTING     OF ANTITHROMBIN III LEVELS.   Dg Chest 2 View  02/16/2013   CLINICAL DATA:  Chest pain, shortness of breath  EXAM: CHEST  2 VIEW  COMPARISON:  None.  FINDINGS: Cardiomegaly evident with mild central vascular congestion. Negative for CHF, pneumonia, collapse, consolidation, effusion or pneumothorax. Trachea midline. Degenerative changes of the spine. No acute osseous finding.  IMPRESSION: Cardiomegaly with vascular congestion   Electronically  Signed   By: Ruel Favors M.D.   On: 02/16/2013 15:25   Ct Angio Chest Aortic Dissect W &/or W/o  02/16/2013   CLINICAL DATA:  Chest pain, nausea, abdominal pain  EXAM: CT ANGIOGRAPHY CHEST, ABDOMEN AND PELVIS  TECHNIQUE: Multidetector CT imaging through the chest, abdomen and pelvis was performed using the standard protocol during bolus administration of intravenous contrast. Multiplanar reconstructed images including MIPs were obtained and reviewed to evaluate the vascular anatomy.  CONTRAST:  OMNIPAQUE IOHEXOL 350 MG/ML SOLN  COMPARISON:  02/16/2013 chest x-ray  FINDINGS: CTA CHEST FINDINGS  Intact thoracic aorta. Negative for dissection. No intramural hematoma noted. Mild ectasia of the transverse thoracic aorta measuring 3.5 cm in diameter, image 46. Ascending aortic diameter is 4 cm, image 59. Major branch vessels are patent. Small amount of free fluid in the superior pericardial recess along the aortic contour and pulmonary arteries. Central pulmonary arteries are patent. No significant pulmonary embolus or filling defect.  Normal heart size. No pericardial or pleural effusion. No adenopathy.  Lung windows demonstrate low volume with vascular crowding. Bibasilar atelectasis with patchy lower lobe mild airspace opacity worsened left lower lobe. Slight lower lobe bronchial thickening. Suspect mild basilar pneumonia. No pneumothorax.  Review of the MIP images confirms the above findings.  CTA ABDOMEN AND PELVIS FINDINGS  Intact abdominal aorta. Negative for dissection, aneurysm, occlusive process or retroperitoneal hemorrhage. Celiac, SMA, renal arteries, and IMA are all patent. Negative for mesenteric or renal vascular occlusive process.  Pelvic common, internal, and external iliac arteries are all patent. Negative for iliac aneurysm or dissection. No iliac conclusion. Visualize common femoral, proximal profunda femoral, and proximal superficial femoral arteries are also patent.  Review of the MIP  images confirms the above findings.  Nonvascular findings: Liver, gallbladder, biliary system, pancreas, spleen, adrenal glands, and kidneys are within normal limits for age and arterial phase imaging.  Negative for bowel obstruction, dilatation, ileus, or free air.  No abdominal free fluid, fluid collection, hemorrhage, abscess, or adenopathy.  Normal appendix demonstrated.  Small amount of free fluid in the pelvis, likely physiologic. Uterus is midline. Left ovary demonstrates a hypodense cyst measuring 5 cm in length and 2.5 cm in diameter. No acute distal bowel process. No pelvic abscess or hemorrhage. No inguinal abnormality or hernia.  Diffuse degenerative changes of the spine.  IMPRESSION: Negative for aortic dissection or acute vascular abnormality.  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 Measurements as above.  No significant large pulmonary embolus centrally.  Mild patchy lower lobe airspace disease with slight bronchial thickening, compatible with mild bronchopneumonia, worse in a left lower lobe.  5 x  2.5 cm left ovarian cyst with trace pelvic free fluid  No acute intra-abdominal or pelvic finding   Electronically Signed   By: Ruel Favors M.D.   On: 02/16/2013 20:33   Ct Angio Abd/pel W/ And/or W/o  02/16/2013   CLINICAL DATA:  Chest pain, nausea, abdominal pain  EXAM: CT ANGIOGRAPHY CHEST, ABDOMEN AND PELVIS  TECHNIQUE: Multidetector CT imaging through the chest, abdomen and pelvis was performed using the standard protocol during bolus administration of intravenous contrast. Multiplanar reconstructed images including MIPs were obtained and reviewed to evaluate the vascular anatomy.  CONTRAST:  OMNIPAQUE IOHEXOL 350 MG/ML SOLN  COMPARISON:  02/16/2013 chest x-ray  FINDINGS: CTA CHEST FINDINGS  Intact thoracic aorta. Negative for dissection. No intramural hematoma noted. Mild ectasia of the transverse thoracic aorta measuring 3.5 cm in  diameter, image 46. Ascending aortic diameter is 4 cm, image 59. Major branch vessels are patent. Small amount of free fluid in the superior pericardial recess along the aortic contour and pulmonary arteries. Central pulmonary arteries are patent. No significant pulmonary embolus or filling defect.  Normal heart size. No pericardial or pleural effusion. No adenopathy.  Lung windows demonstrate low volume with vascular crowding. Bibasilar atelectasis with patchy lower lobe mild airspace opacity worsened left lower lobe. Slight lower lobe bronchial thickening. Suspect mild basilar pneumonia. No pneumothorax.  Review of the MIP images confirms the above findings.  CTA ABDOMEN AND PELVIS FINDINGS  Intact abdominal aorta. Negative for dissection, aneurysm, occlusive process or retroperitoneal hemorrhage. Celiac, SMA, renal arteries, and IMA are all patent. Negative for mesenteric or renal vascular occlusive process.  Pelvic common, internal, and external iliac arteries are all patent. Negative for iliac aneurysm or dissection. No iliac conclusion. Visualize common femoral, proximal profunda femoral, and proximal superficial femoral arteries are also patent.  Review of the MIP images confirms the above findings.  Nonvascular findings: Liver, gallbladder, biliary system, pancreas, spleen, adrenal glands, and kidneys are within normal limits for age and arterial phase imaging.  Negative for bowel obstruction, dilatation, ileus, or free air.  No abdominal free fluid, fluid collection, hemorrhage, abscess, or adenopathy.  Normal appendix demonstrated.  Small amount of free fluid in the pelvis, likely physiologic. Uterus is midline. Left ovary demonstrates a hypodense cyst measuring 5 cm in length and 2.5 cm in diameter. No acute distal bowel process. No pelvic abscess or hemorrhage. No inguinal abnormality or hernia.  Diffuse degenerative changes of the spine.  IMPRESSION: Negative for aortic dissection or acute vascular  abnormality.  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 Measurements as above.  No significant large pulmonary embolus centrally.  Mild patchy lower lobe airspace disease with slight bronchial thickening, compatible with mild bronchopneumonia, worse in a left lower lobe.  5 x 2.5 cm left ovarian cyst with trace pelvic free fluid  No acute intra-abdominal or pelvic finding   Electronically Signed   By: Ruel Favors M.D.   On: 02/16/2013 20:33    Review of Systems  Constitutional: Positive for malaise/fatigue. Negative for fever, chills and weight loss.  HENT: Negative for ear pain, hearing loss and tinnitus.   Eyes: Negative for double vision, photophobia and pain.  Respiratory: Positive for shortness of breath. Negative for cough.   Cardiovascular: Positive for chest pain. Negative for palpitations, orthopnea and leg swelling.  Gastrointestinal: Positive for heartburn and nausea. Negative for vomiting and abdominal pain.  Genitourinary: Negative for dysuria, urgency and frequency.  Musculoskeletal: Negative for back  pain and neck pain.  Skin: Negative for rash.  Neurological: Positive for weakness. Negative for dizziness, tingling and tremors.  Endo/Heme/Allergies: Negative for environmental allergies. Bruises/bleeds easily.  Psychiatric/Behavioral: Negative for suicidal ideas, hallucinations and substance abuse.    Blood pressure 147/94, pulse 75, temperature 97.9 F (36.6 C), temperature source Oral, resp. rate 18, height 5\' 6"  (1.676 m), weight 117.8 kg (259 lb 11.2 oz), last menstrual period 02/10/2013, SpO2 96.00%. Physical Exam  Nursing note and vitals reviewed. Constitutional: She is oriented to person, place, and time. She appears well-developed and well-nourished. No distress.  HENT:  Head: Normocephalic and atraumatic.  Nose: Nose normal.  Mouth/Throat: Oropharynx is clear and moist. No oropharyngeal exudate.  Eyes: Conjunctivae  and EOM are normal. Pupils are equal, round, and reactive to light. No scleral icterus.  Neck: Neck supple. No JVD present. No tracheal deviation present. No thyromegaly present.  Cardiovascular: Normal rate, regular rhythm, normal heart sounds and intact distal pulses.  Exam reveals no gallop.   No murmur heard. GI: Soft. Bowel sounds are normal. She exhibits no distension. There is no tenderness. There is no rebound.  Musculoskeletal: Normal range of motion. She exhibits no edema and no tenderness.  Neurological: She is alert and oriented to person, place, and time. No cranial nerve deficit.  Skin: Skin is warm and dry. No rash noted. She is not diaphoretic. No erythema.  Psychiatric: She has a normal mood and affect. Her behavior is normal. Thought content normal.   labs reviewed h/h 12/36, plt 270, wbc 6.4, na 134, bun 9/cr 0.63, K 3.8 CTA negative dissection INR 1.1 CT also with 5x2.5 cm ovarian cyst ECG reviewed; sinus, poor R wave progression, lateral ST depression/twi Problem List Chest Pain/NSTEMI Elevated troponin Normocytic anemia ? 5x2.5 cm ovarian cyst Hypertension  Assessment/Plan 52 yo woman with PMH of hypertension who has chest pain and elevated troponin consistent with  NSTEMI. Differential diagnosis also includes musculoskeletal pain, GERD, esophageal spasm, coronary artery disease, pneumonia, atypical chest pain and other misc etiologies. I favor a diagnosis of ACS/NSTEMI given constellation of symptoms. Likely LHC in AM. Discussed with Deborah Williamson.   - aspirin 324 mg given, 81 mg PO daily thereafter  - heparin gtt initiated in ER - atorvastatin 80 mg qHS first dose now - tsh, BNP, hba1c, lipid in AM, Mg2+, update CMP - NPO after MN for likely LHC - metoprolol 12.5 mg bid - Review films/size of ovarian cyst in AM potentially with Gyn - echo order deferred given potential LV-gram  Sherlie Boyum 02/16/2013, 10:40 PM

## 2013-02-16 NOTE — ED Notes (Signed)
Critical lab value called from lab.  Dr. Preston Fleeting made aware

## 2013-02-16 NOTE — ED Notes (Signed)
Rt sided to middle cp hurts to touch and when she walks states that she was on HTN med but they took her off cp states has had nothing to eat ttoday. States was very nauseated  And hot,

## 2013-02-17 ENCOUNTER — Encounter (HOSPITAL_COMMUNITY): Payer: Self-pay | Admitting: General Practice

## 2013-02-17 ENCOUNTER — Encounter (HOSPITAL_COMMUNITY)
Admission: EM | Disposition: A | Discharge status: Home / Self Care | Payer: Self-pay | Source: Home / Self Care | Attending: Cardiology

## 2013-02-17 DIAGNOSIS — R079 Chest pain, unspecified: Secondary | ICD-10-CM

## 2013-02-17 DIAGNOSIS — I369 Nonrheumatic tricuspid valve disorder, unspecified: Secondary | ICD-10-CM

## 2013-02-17 HISTORY — PX: CARDIAC CATHETERIZATION: SHX172

## 2013-02-17 HISTORY — PX: LEFT HEART CATHETERIZATION WITH CORONARY ANGIOGRAM: SHX5451

## 2013-02-17 LAB — COMPREHENSIVE METABOLIC PANEL
Albumin: 2.8 g/dL — ABNORMAL LOW (ref 3.5–5.2)
BUN: 9 mg/dL (ref 6–23)
Calcium: 8.7 mg/dL (ref 8.4–10.5)
Chloride: 101 mEq/L (ref 96–112)
Creatinine, Ser: 0.74 mg/dL (ref 0.50–1.10)
GFR calc Af Amer: >90 mL/min (ref >90)
GFR calc non Af Amer: >90 mL/min (ref >90)
Glucose, Bld: 108 mg/dL — ABNORMAL HIGH (ref 70–99)
Potassium: 3.7 mEq/L (ref 3.5–5.1)
Total Bilirubin: 0.4 mg/dL (ref 0.3–1.2)

## 2013-02-17 LAB — CBC
HCT: 32.6 % — ABNORMAL LOW (ref 36.0–46.0)
MCHC: 33.7 g/dL (ref 30.0–36.0)
Platelets: 229 10*3/uL (ref 150–400)
RBC: 4.12 MIL/uL (ref 3.87–5.11)
RDW: 13.9 % (ref 11.5–15.5)
WBC: 6.7 10*3/uL (ref 4.0–10.5)

## 2013-02-17 LAB — LIPID PANEL
Cholesterol: 152 mg/dL (ref 0–200)
LDL Cholesterol: 98 mg/dL (ref 0–99)
VLDL: 12 mg/dL (ref 0–40)

## 2013-02-17 LAB — PRO B NATRIURETIC PEPTIDE: Pro B Natriuretic peptide (BNP): 1468 pg/mL — ABNORMAL HIGH (ref 0–125)

## 2013-02-17 LAB — TROPONIN I
Troponin I: 2.66 ng/mL (ref <0.30)
Troponin I: 0.66 ng/mL (ref <0.30)

## 2013-02-17 LAB — HEPARIN LEVEL (UNFRACTIONATED): Heparin Unfractionated: <0.1 IU/mL — ABNORMAL LOW (ref 0.30–0.70)

## 2013-02-17 LAB — MAGNESIUM: Magnesium: 1.7 mg/dL (ref 1.5–2.5)

## 2013-02-17 SURGERY — LEFT HEART CATHETERIZATION WITH CORONARY ANGIOGRAM
Anesthesia: LOCAL

## 2013-02-17 MED ORDER — ASPIRIN 81 MG PO CHEW
81.0000 mg | CHEWABLE_TABLET | ORAL | Status: AC
  Administered 2013-02-17: 09:00:00 81 mg via ORAL
  Filled 2013-02-17: qty 1

## 2013-02-17 MED ORDER — SODIUM CHLORIDE 0.9 % IJ SOLN: 3.0000 mL | Freq: Two times a day (BID) | INTRAMUSCULAR | Status: DC

## 2013-02-17 MED ORDER — NITROGLYCERIN 0.2 MG/ML ON CALL CATH LAB
INTRAVENOUS | Status: AC
  Filled 2013-02-17: qty 1

## 2013-02-17 MED ORDER — ZOLPIDEM TARTRATE 5 MG PO TABS
5.0000 mg | ORAL_TABLET | Freq: Once | ORAL | Status: AC
  Administered 2013-02-17: 21:00:00 5 mg via ORAL
  Filled 2013-02-17: qty 1

## 2013-02-17 MED ORDER — SODIUM CHLORIDE 0.9 % IV SOLN: 250.0000 mL | INTRAVENOUS | Status: DC | PRN

## 2013-02-17 MED ORDER — SODIUM CHLORIDE 0.9 % IV SOLN: INTRAVENOUS | Status: DC

## 2013-02-17 MED ORDER — MIDAZOLAM HCL 2 MG/2ML IJ SOLN
INTRAMUSCULAR | Status: AC
  Filled 2013-02-17: qty 2

## 2013-02-17 MED ORDER — HEPARIN (PORCINE) IN NACL 2-0.9 UNIT/ML-% IJ SOLN
INTRAMUSCULAR | Status: AC
  Filled 2013-02-17: qty 1500

## 2013-02-17 MED ORDER — SODIUM CHLORIDE 0.9 % IJ SOLN: 3.0000 mL | INTRAMUSCULAR | Status: DC | PRN

## 2013-02-17 MED ORDER — FENTANYL CITRATE 0.05 MG/ML IJ SOLN
INTRAMUSCULAR | Status: AC
  Filled 2013-02-17: qty 2

## 2013-02-17 MED ORDER — LIDOCAINE HCL (PF) 1 % IJ SOLN
INTRAMUSCULAR | Status: AC
  Filled 2013-02-17: qty 30

## 2013-02-17 MED ORDER — SODIUM CHLORIDE 0.9 % IV SOLN
INTRAVENOUS | Status: DC
  Administered 2013-02-17: 08:00:00 via INTRAVENOUS

## 2013-02-17 MED ORDER — DIAZEPAM 5 MG PO TABS
5.0000 mg | ORAL_TABLET | ORAL | Status: AC
  Administered 2013-02-17: 5 mg via ORAL
  Filled 2013-02-17: qty 1

## 2013-02-17 MED ORDER — ASPIRIN 81 MG PO CHEW: 81.0000 mg | CHEWABLE_TABLET | ORAL | Status: DC

## 2013-02-17 MED ORDER — LISINOPRIL 5 MG PO TABS
5.0000 mg | ORAL_TABLET | Freq: Every day | ORAL | Status: DC
  Administered 2013-02-17 – 2013-02-18 (×2): 5 mg via ORAL
  Filled 2013-02-17 (×3): qty 1

## 2013-02-17 MED ORDER — ZOLPIDEM TARTRATE 5 MG PO TABS
5.0000 mg | ORAL_TABLET | Freq: Once | ORAL | Status: AC
  Administered 2013-02-17: 5 mg via ORAL
  Filled 2013-02-17: qty 1

## 2013-02-17 MED ORDER — SODIUM CHLORIDE 0.9 % IV SOLN: INTRAVENOUS | Status: AC

## 2013-02-17 NOTE — CV Procedure (Addendum)
     Cardiac Catheterization Operative Report  Deborah Williamson (Changed name in EPIC to [name] after cath) [id number] 11/26/201412:46 PM PICKETT,KATHERINE, PA-C  Procedure Performed:  1. Left Heart Catheterization 2. Selective Coronary Angiography 3. Left ventricular angiogram 4. Mynx closure device RFA  Operator: Verne Carrow, MD  Indication:  52 yo female with HTN, obesity admitted after episode of chest pain. Troponin elevated. Cardiac cath to exclude obstructive CAD.                                    Procedure Details: The risks, benefits, complications, treatment options, and expected outcomes were discussed with the patient. The patient and/or family concurred with the proposed plan, giving informed consent. The patient was brought to the cath lab after IV hydration was begun and oral premedication was given. The patient was further sedated with Versed and Fentanyl. The right groin was prepped and draped in the usual manner. Using the modified Seldinger access technique, a 5 French sheath was placed in the right femoral artery. Standard diagnostic catheters were used to perform selective coronary angiography. A pigtail catheter was used to perform a left ventricular angiogram.  There were no immediate complications. The patient was taken to the recovery area in stable condition.   Hemodynamic Findings: Central aortic pressure: 124/81 Left ventricular pressure: 112/13/25  Angiographic Findings:  Left main: No obstructive disease.   Left Anterior Descending Artery: Large caliber vessel that courses to the apex. Small caliber diagonal branch. No obstructive disease.   Circumflex Artery: Large caliber vessel with large obtuse marginal branch. No obstructive disease.   Right Coronary Artery: Large dominant vessel with no obstructive disease.   Left Ventricular Angiogram: LVEF=35%. Hypokinesis of the anteroapical wall, apex and inferoapical wall.    Impression: 1. No angiographic evidence of CAD 2. 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   Recommendations: Will check d-dimer. Her presentation is most likely consistent with stress induced cardiomyopathy. Will start beta blocker and Ace-inh. She will be treated with ASA and statin.        Complications:  None. The patient tolerated the procedure well.

## 2013-02-17 NOTE — ED Provider Notes (Signed)
I saw and evaluated the patient, reviewed the resident's note and I agree with the findings and plan.     Dione Booze, MD 02/17/13 [phone]

## 2013-02-17 NOTE — Progress Notes (Signed)
     SUBJECTIVE: No chest pain this am.   BP 116/79  Pulse 68  Temp(Src) 97.8 F (36.6 C) (Oral)  Resp 20  Ht 5\' 6"  (1.676 m)  Wt 259 lb 11.2 oz (117.8 kg)  BMI 41.94 kg/m2  SpO2 98%  LMP 02/10/2013  Intake/Output Summary (Last 24 hours) at 02/17/13 0706 Last data filed at 02/17/13 1191  Gross per 24 hour  Intake 384.65 ml  Output    200 ml  Net 184.65 ml    PHYSICAL EXAM General: Well developed, well nourished, in no acute distress. Alert and oriented x 3.  Psych:  Good affect, responds appropriately Neck: No JVD. No masses noted.  Lungs: Clear bilaterally with no wheezes or rhonci noted.  Heart: RRR with no murmurs noted. Abdomen: Bowel sounds are present. Soft, non-tender.  Extremities: No lower extremity edema.   LABS: Basic Metabolic Panel:  Recent Labs  47/82/95 0018 02/16/13 1420  NA 134* 134*  K 3.7 3.8  CL 101 100  CO2 25 25  GLUCOSE 108* 98  BUN 9 9  CREATININE 0.74 0.63  CALCIUM 8.7 8.9  MG 1.7  --    CBC:  Recent Labs  02/16/13 1420  WBC 6.4  HGB 12.1  HCT 35.6*  MCV 81.3  PLT 270   Cardiac Enzymes:  Recent Labs  02/16/13 0018 02/16/13 1812  TROPONINI 2.66* 3.80*    Current Meds: . aspirin EC  81 mg Oral Daily  . atorvastatin  80 mg Oral q1800  . metoprolol tartrate  12.5 mg Oral BID  . pantoprazole  40 mg Oral Daily  . sodium chloride  3 mL Intravenous Q12H     ASSESSMENT AND PLAN:  1. Chest pain/NSTEMI:  Troponin elevated. Presentation c/w acute coronary syndrome. Plan cath with possible PCI  this afternoon. Clear liquid breakfast then NPO. Continue ASA, statin, beta blocker and IV heparin while awaiting cath.   MCALHANY,CHRISTOPHER  11/26/20147:06 AM

## 2013-02-17 NOTE — Interval H&P Note (Signed)
History and Physical Interval Note:  02/17/2013 12:09 PM  Deborah Williamson  has presented today for cardiac cath with the diagnosis of NSTEMI/cp.  The various methods of treatment have been discussed with the patient and family. After consideration of risks, benefits and other options for treatment, the patient has consented to  Procedure(s): LEFT HEART CATHETERIZATION WITH CORONARY ANGIOGRAM (N/A) as a surgical intervention .  The patient's history has been reviewed, patient examined, no change in status, stable for surgery.  I have reviewed the patient's chart and labs.  Questions were answered to the patient's satisfaction.    Cath Lab Visit (complete for each Cath Lab visit)  Clinical Evaluation Leading to the Procedure:   ACS: yes  Non-ACS:    Anginal Classification: CCS IV  Anti-ischemic medical therapy: Minimal Therapy (1 class of medications)  Non-Invasive Test Results: No non-invasive testing performed  Prior CABG: No previous CABG        Deborah Williamson

## 2013-02-17 NOTE — Progress Notes (Signed)
[id number] Pt on bedrest so did not walk. Education completed on  takotsubo MI, ex,low sodium diet, CHF signs and symptoms. Gave CHF booklet and reviewed signs/symptoms and when to call MD. Discussed importance of daily weight, 2L fluid restriction and 2000 mg sodium restriction. Pt states she eats a lot of salt and this will be a hard change for her. Discussed CRP 2 and permission given to refer to [place] program. Duanne Limerick, RN BSN 02/17/2013 2:43 PM

## 2013-02-17 NOTE — Progress Notes (Signed)
ANTICOAGULATION CONSULT NOTE  Pharmacy Consult for heparin Indication: chest pain/ACS  No Known Allergies  Patient Measurements: weight=109 kg,  Height= 66 inches (per patient) Height: 5\' 6"  (167.6 cm) Weight: 259 lb 11.2 oz (117.8 kg) IBW/kg (Calculated) : 59.3 Heparin Dosing Weight: 84 kg  Vital Signs: Temp: 98.3 F (36.8 C) (11/26 0752) Temp src: Oral (11/26 0752) BP: 107/82 mmHg (11/26 0752) Pulse Rate: 63 (11/26 0752)  Labs:  Recent Labs  02/16/13 0018 02/16/13 1420 02/16/13 1812 02/16/13 2137 02/17/13 0550  HGB  --  12.1  --   --  11.0*  HCT  --  35.6*  --   --  32.6*  PLT  --  270  --   --  229  LABPROT  --   --  14.0  --   --   INR  --   --  1.10  --   --   HEPARINUNFRC  --   --   --  0.48 <0.10*  CREATININE 0.74 0.63  --   --   --   TROPONINI 2.66*  --  3.80*  --  0.73*    Estimated Creatinine Clearance: 108.6 ml/min (by C-G formula based on Cr of 0.63).   Medical History: Past Medical History  Diagnosis Date  . Hypertension   . Headache(784.0)   . Depression   . Anxiety     Medications:  See med rec  Assessment: Patient is a 52 y.o F presented to the ED with c/o CP and elevated troponin, on IV heparin, awaiting for cath this afternoon. Heparin level < 0.1 this morning, no interruption with infusion per RN. No bleeding noted per chart.  Goal of Therapy:  Heparin level 0.3-0.7 units/ml Monitor platelets by anticoagulation protocol: Yes   Plan:  1) Increase heparin drip to 1250 units / hr 2) f/u 6 hr heparin level at 1500 or f/u after cath.  Thank you.  Bayard Hugger, PharmD, BCPS  Clinical Pharmacist  Pager: [phone]   02/17/2013,8:42 AM

## 2013-02-17 NOTE — Progress Notes (Signed)
Utilization Review Completed.Ahad Colarusso T11/26/2014  

## 2013-02-17 NOTE — Progress Notes (Signed)
Echocardiogram 2D Echocardiogram has been performed.  Dorothey Baseman 02/17/2013, 4:52 PM

## 2013-02-17 NOTE — H&P (View-Only) (Signed)
     SUBJECTIVE: No chest pain this am.   BP 116/79  Pulse 68  Temp(Src) 97.8 F (36.6 C) (Oral)  Resp 20  Ht 5' 6" (1.676 m)  Wt 259 lb 11.2 oz (117.8 kg)  BMI 41.94 kg/m2  SpO2 98%  LMP 02/10/2013  Intake/Output Summary (Last 24 hours) at 02/17/13 0706 Last data filed at 02/17/13 0622  Gross per 24 hour  Intake 384.65 ml  Output    200 ml  Net 184.65 ml    PHYSICAL EXAM General: Well developed, well nourished, in no acute distress. Alert and oriented x 3.  Psych:  Good affect, responds appropriately Neck: No JVD. No masses noted.  Lungs: Clear bilaterally with no wheezes or rhonci noted.  Heart: RRR with no murmurs noted. Abdomen: Bowel sounds are present. Soft, non-tender.  Extremities: No lower extremity edema.   LABS: Basic Metabolic Panel:  Recent Labs  02/16/13 0018 02/16/13 1420  NA 134* 134*  K 3.7 3.8  CL 101 100  CO2 25 25  GLUCOSE 108* 98  BUN 9 9  CREATININE 0.74 0.63  CALCIUM 8.7 8.9  MG 1.7  --    CBC:  Recent Labs  02/16/13 1420  WBC 6.4  HGB 12.1  HCT 35.6*  MCV 81.3  PLT 270   Cardiac Enzymes:  Recent Labs  02/16/13 0018 02/16/13 1812  TROPONINI 2.66* 3.80*    Current Meds: . aspirin EC  81 mg Oral Daily  . atorvastatin  80 mg Oral q1800  . metoprolol tartrate  12.5 mg Oral BID  . pantoprazole  40 mg Oral Daily  . sodium chloride  3 mL Intravenous Q12H     ASSESSMENT AND PLAN:  1. Chest pain/NSTEMI:  Troponin elevated. Presentation c/w acute coronary syndrome. Plan cath with possible PCI  this afternoon. Clear liquid breakfast then NPO. Continue ASA, statin, beta blocker and IV heparin while awaiting cath.   MCALHANY,CHRISTOPHER  11/26/20147:06 AM  

## 2013-02-18 ENCOUNTER — Encounter (HOSPITAL_COMMUNITY): Payer: Self-pay | Admitting: Physician Assistant

## 2013-02-18 DIAGNOSIS — I214 Non-ST elevation (NSTEMI) myocardial infarction: Secondary | ICD-10-CM

## 2013-02-18 DIAGNOSIS — I712 Thoracic aortic aneurysm, without rupture: Secondary | ICD-10-CM

## 2013-02-18 DIAGNOSIS — N83202 Unspecified ovarian cyst, left side: Secondary | ICD-10-CM

## 2013-02-18 DIAGNOSIS — I5181 Takotsubo syndrome: Secondary | ICD-10-CM

## 2013-02-18 DIAGNOSIS — I7121 Aneurysm of the ascending aorta, without rupture: Secondary | ICD-10-CM

## 2013-02-18 DIAGNOSIS — I428 Other cardiomyopathies: Secondary | ICD-10-CM

## 2013-02-18 LAB — CBC
HCT: 31.1 % — ABNORMAL LOW (ref 36.0–46.0)
Hemoglobin: 10.5 g/dL — ABNORMAL LOW (ref 12.0–15.0)
MCH: 27.3 pg (ref 26.0–34.0)
RBC: 3.84 MIL/uL — ABNORMAL LOW (ref 3.87–5.11)
RDW: 13.7 % (ref 11.5–15.5)

## 2013-02-18 MED ORDER — LISINOPRIL 5 MG PO TABS: 5.0000 mg | ORAL_TABLET | Freq: Every day | ORAL | Status: DC

## 2013-02-18 MED ORDER — ATORVASTATIN CALCIUM 80 MG PO TABS: 80.0000 mg | ORAL_TABLET | Freq: Every day | ORAL | Status: DC

## 2013-02-18 MED ORDER — CARVEDILOL 3.125 MG PO TABS: 3.1250 mg | ORAL_TABLET | Freq: Two times a day (BID) | ORAL | Status: DC

## 2013-02-18 MED ORDER — ASPIRIN 81 MG PO TBEC: 81.0000 mg | DELAYED_RELEASE_TABLET | Freq: Every day | ORAL | Status: DC

## 2013-02-18 NOTE — Discharge Summary (Signed)
Discharge Summary   Patient ID: Deborah Williamson,  MRN: [id number], DOB/AGE: 1961/02/22 52 y.o.  Admit date: 02/16/2013 Discharge date: 02/18/2013  Primary Physician: Deborah Williamson Primary Cardiologist: C. Clifton James, MD  Discharge Diagnoses Principal Problem:   NSTEMI (non-ST elevated myocardial infarction)  - S/p cardiac catheterization 11/26- no angiographic evidence of CAD, EF 35%, HK or the anteroapical wall, apex and inferoapical wall  - Started on ASA, statin, ACEi, BB   - Check LFTs, lipid panel in 6 weeks Active Problems:   Takotsubo cardiomyopathy  - 2D echo: EF 30-35%, periapical AK, normal RV size/function, moderate pulmonary HTN. Pattern was suggestive of Takotsubo CM.   - Repeat echo in 6 to 12 weeks on optimized medical therapy  - Euvolemic on discharge. Started on ACEi/BB.    Hypertension  - ACEi/BB added   Normocytic anemia  - Stable. Follow-up PCP.    Left ovarian cyst  - Follow-up PCP for further evaluation/management   Ascending aortic aneurysm  - 3.5-4 cm noted on CT-A  - Continue to monitor  Allergies No Known Allergies  Diagnostic Studies/Procedures  PA/LATERAL CHEST X-RAY - 02/16/13  IMPRESSION:  Cardiomegaly with vascular congestion  CT ANGIOGRAM OF THE CHEST/ABDOMEN/PELVIS - 02/16/13  IMPRESSION:  Negative for aortic dissection or acute vascular abnormality.  Mild aneurysmal dilatation of the ascending and transverse segments  of the thoracic aorta. Measurements as above.  No significant large pulmonary embolus centrally.  Mild patchy lower lobe airspace disease with slight bronchial  thickening, compatible with mild bronchopneumonia, worse in a left  lower lobe.  5 x 2.5 cm left ovarian cyst with trace pelvic free fluid  No acute intra-abdominal or pelvic finding  CARDIAC CATHETERIZATION - 02/17/13  Hemodynamic Findings:  Central aortic pressure: 124/81  Left ventricular pressure: 112/13/25  Angiographic Findings:    Left main: No obstructive disease.  Left Anterior Descending Artery: Large caliber vessel that courses to the apex. Small caliber diagonal branch. No obstructive disease.  Circumflex Artery: Large caliber vessel with large obtuse marginal branch. No obstructive disease.  Right Coronary Artery: Large dominant vessel with no obstructive disease.  Left Ventricular Angiogram: LVEF=35%. Hypokinesis of the anteroapical wall, apex and inferoapical wall.   Impression:  1. No angiographic evidence of CAD  2. 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   2D ECHOCARDIOGRAM WITHOUT CONTRAST - 02/17/13  Normal LV size with EF 30-35%. Periapical akinesis. Pattern of wall motion abnormalities suggests LAD-territory infarction or Takotsubo cardiomyopathy. Normal RV size and systolic function. Moderate pulmonary hypertension.  History of Present Illness Deborah Williamson is a 52 y.o. female who was admitted to [facility] 02/16/13 with the above problem list.   She has no prior cardiac history. She has a PMHx s/f HTN, depressions and anxiety who presented to the [facility] ED 11/25 c/o R sided chest pressure with associated nausea and shortness of breath. She related this to indigestion, however it did not improve over time or with Tums. She developed gradual weakness and presented to the ED.   There, EKG indicated lateral ST depressions with TWIs. Initial troponin returned elevated at 2.66. She was heparinized. She had received a full-dose ASA. A normocytic anemia was appreciated. D-dimer in the ED returned mildly elevated at 0.74. CT-A chest/abd/pelvis was performed, which as above, indicated no evidence of aortic dissection or PE. Incidental findings included L ovarian cyst and mild ascending aortic dilatation/aneurysm. CXR as above revealed cardiomegaly without an acute cardiopulmonary process evident. The decision was  made to admit the patient and plan for  cardiac catheterization the following day.   Hospital Course   The patient's troponins were cycled and returned elevated with a downtrend. She remained asymptomatic overnight. Labwork was drawn as outlined below. Her presentation was c/w ACS, and cath was arranged.   She was informed, consented and prepped for the procedure which was accessed via the R groin. As above, this revealed no angiographic evidence of CAD; EF 35%, HK of the anteroapical wall, apex and inferoapical wall c/w Takotsubo cardiomyopathy. The decision start BB/ACEi for nonischemic cardiomyopathy. ASA, statin were resumed.   She remained stable overnight with no further symptoms. She ambulated the next morning with cardiac rehab w/o incident. 2D echo was performed which revealed EF 30-35%, periapical AK, normal RV size/function, moderate pulmonary HTN. Pattern was suggestive of Takotsubo CM.   She was observed for an additional night, and was deemed to be stable for discharge by Dr. Clifton Williamson this morning. She was found to be euvolemic this AM. She will be discharged on the medication regimen outlined below. She will follow-up with Dr. Clifton Williamson in 4 weeks. Follow-up with her PCP in 1 week was recommended for further evaluation of an incidental left ovarian cyst appreciated on CT scan. This information, including post-cath instructions, activity restrictions and HF education, has been clearly outlined in the discharge AVS.   Discharge Vitals:  Blood pressure 126/82, pulse 89, temperature 98.1 F (36.7 C), temperature source Oral, resp. rate 18, height 5\' 6"  (1.676 m), weight 117.8 kg (259 lb 11.2 oz), last menstrual period 02/10/2013, SpO2 99.00%.  Labs: Recent Labs     02/17/13  0550  02/18/13  0610  WBC  6.7  7.7  HGB  11.0*  10.5*  HCT  32.6*  31.1*  MCV  79.1  81.0  PLT  229  235   Recent Labs     02/16/13  1812  02/17/13  1240  DDIMER  0.73*  0.42    Recent Labs Lab 02/16/13 0018 02/16/13 1420  NA 134* 134*    K 3.7 3.8  CL 101 100  CO2 25 25  BUN 9 9  CREATININE 0.74 0.63  CALCIUM 8.7 8.9  PROT 6.4 7.3  BILITOT 0.4 0.4  ALKPHOS 59 66  ALT 7 7  AST 22 17  GLUCOSE 108* 98   Recent Labs     02/16/13  0018  HGBA1C  5.5   Recent Labs     02/16/13  1812  02/17/13  0550  02/17/13  1052  TROPONINI  3.80*  0.73*  0.66*   Recent Labs     02/17/13  0550  CHOL  152  HDL  42  LDLCALC  98  TRIG  59  CHOLHDL  3.6    Recent Labs  02/16/13 0018  TSH 2.122    Disposition:  Discharge Orders   Future Orders Complete By Expires   Amb Referral to Cardiac Rehabilitation  As directed    Diet - low sodium heart healthy  As directed    Increase activity slowly  As directed          Follow-up Information   Follow up with [facility]. (Office will call with an appointment date & time to be scheduled in ~ 3-4 weeks)    Contact information:   [address] [place] [address]       Follow up with PICKETT,KATHERINE, PA-C In 1 week. (For follow-up  of ovarian cyst detected on CT. )    Contact information:   [address] [place] [address] [phone]      Discharge Medications:    Medication List    STOP taking these medications       aspirin 325 MG tablet  Replaced by:  aspirin 81 MG EC tablet      TAKE these medications       aspirin 81 MG EC tablet  Take 1 tablet (81 mg total) by mouth daily.     atorvastatin 80 MG tablet  Commonly known as:  LIPITOR  Take 1 tablet (80 mg total) by mouth daily at 6 PM.     carvedilol 3.125 MG tablet  Commonly known as:  COREG  Take 1 tablet (3.125 mg total) by mouth 2 (two) times daily with a meal.     lisinopril 5 MG tablet  Commonly known as:  PRINIVIL,ZESTRIL  Take 1 tablet (5 mg total) by mouth daily.     omeprazole 10 MG capsule  Commonly known as:  PRILOSEC  Take 10 mg by mouth daily as needed (heartburn).     TUMS PO  Take 2 tablets by  mouth daily as needed (heartburn).       Outstanding Labs/Studies: repeat echo in 6 to 12 weeks, LFTs and lipid panel in 6 weeks  Duration of Discharge Encounter: Greater than 30 minutes including physician time.  Signed, R. Hurman Horn, PA-C 02/18/2013, 11:45 AM

## 2013-02-18 NOTE — Progress Notes (Signed)
SUBJECTIVE: No chest pain or SOB.   BP 102/66  Pulse 70  Temp(Src) 98.1 F (36.7 C) (Oral)  Resp 18  Ht 5\' 6"  (1.676 m)  Wt 259 lb 11.2 oz (117.8 kg)  BMI 41.94 kg/m2  SpO2 99%  LMP 02/10/2013  Intake/Output Summary (Last 24 hours) at 02/18/13 0802 Last data filed at 02/17/13 1806  Gross per 24 hour  Intake 483.33 ml  Output    500 ml  Net -16.67 ml    PHYSICAL EXAM General: Well developed, well nourished, in no acute distress. Alert and oriented x 3.  Psych:  Good affect, responds appropriately Neck: No JVD. No masses noted.  Lungs: Clear bilaterally with no wheezes or rhonci noted.  Heart: RRR with no murmurs noted. Abdomen: Bowel sounds are present. Soft, non-tender.  Extremities: No lower extremity edema. Right groin cath site without hematoma.   LABS: Basic Metabolic Panel:  Recent Labs  16/10/96 0018 02/16/13 1420  NA 134* 134*  K 3.7 3.8  CL 101 100  CO2 25 25  GLUCOSE 108* 98  BUN 9 9  CREATININE 0.74 0.63  CALCIUM 8.7 8.9  MG 1.7  --    CBC:  Recent Labs  02/17/13 0550 02/18/13 0610  WBC 6.7 7.7  HGB 11.0* 10.5*  HCT 32.6* 31.1*  MCV 79.1 81.0  PLT 229 235   Cardiac Enzymes:  Recent Labs  02/16/13 1812 02/17/13 0550 02/17/13 1052  TROPONINI 3.80* 0.73* 0.66*   Fasting Lipid Panel:  Recent Labs  02/17/13 0550  CHOL 152  HDL 42  LDLCALC 98  TRIG 59  CHOLHDL 3.6    Current Meds: . aspirin EC  81 mg Oral Daily  . atorvastatin  80 mg Oral q1800  . lisinopril  5 mg Oral Daily  . metoprolol tartrate  12.5 mg Oral BID  . pantoprazole  40 mg Oral Daily  . sodium chloride  3 mL Intravenous Q12H   Echo 02/17/13: Left ventricle: The cavity size was normal. Wall thickness was normal. Systolic function was moderately to severely reduced. The estimated ejection fraction was in the range of 30% to 35%. Mid to apical septal akinesis, apical lateral akinesis, apical inferior akinesis, apical anterior akinesis, and akinesis  of the true apex. Doppler parameters are consistent with abnormal left ventricular relaxation (grade 1 diastolic dysfunction). - Aortic valve: There was no stenosis. - Mitral valve: Trivial regurgitation. - Right ventricle: The cavity size was normal. Systolic function was normal. - Tricuspid valve: Peak RV-RA gradient:58mm Hg (S). - Pulmonary arteries: PA peak pressure: 50mm Hg (S). - Inferior vena cava: The vessel was normal in size; the respirophasic diameter changes were in the normal range (= 50%); findings are consistent with normal central venous pressure. Impressions:  - Normal LV size with EF 30-35%. Periapical akinesis. Pattern of wall motion abnormalities suggests LAD-territory infarction or Takotsubo cardiomyopathy. Normal RV size and systolic function. Moderate pulmonary hypertension.  ASSESSMENT AND PLAN:  1. Chest pain/Elevated troponin/non-cardiomyopathy: Cardiac cath 02/17/13 with no evidence of CAD. Her LV gram and echo show moderate to severe segment LV systolic dysfunction c/w stress induced cardiomyopathy (Takotsubo cardiomyopathy). Will continue medical management for now. Volume status is ok. Will continue ASA, statin, beta blocker and Ace-inh. Discharge home today. Follow up with me in 3-4 weeks.   2. Ovarian cyst: Noted on CT abdomen. She is given a copy of this report and will f/u with her primary care provider to discuss and make a referral  to Gynecology.   MCALHANY,CHRISTOPHER  11/27/20148:02 AM

## 2013-02-20 NOTE — ED Provider Notes (Signed)
Medical screening examination/treatment/procedure(s) were conducted as a shared visit with non-physician practitioner(s) and myself.  I personally evaluated the patient during the encounter.  1 hour of R sided chest pain with nausea and SOB.  Reproducible to palpation and worse with arm movement. EKG nonischemic.  Pain seems atypical for ACS but troponin elevated. ASA, heparin, d/w Cards  EKG Interpretation    Date/Time:  Tuesday February 16 2013 14:02:08 EST Ventricular Rate:  75 PR Interval:    QRS Duration: 90 QT Interval:  452 QTC Calculation: 505 R Axis:   25 Text Interpretation:  normal sinus Nonspecific T abnormalities, lateral leads Borderline prolonged QT interval Confirmed by Manus Gunning  MD, Ariba Lehnen ([id number]) on 02/16/2013 2:14:36 PM              Glynn Octave, MD 02/20/13 [phone]

## 2013-02-22 NOTE — Discharge Summary (Signed)
See full note.cdm 

## 2013-03-16 ENCOUNTER — Encounter: Payer: Self-pay | Admitting: *Deleted

## 2013-03-23 ENCOUNTER — Ambulatory Visit (INDEPENDENT_AMBULATORY_CARE_PROVIDER_SITE_OTHER): Payer: BC Managed Care – PPO | Admitting: Physician Assistant

## 2013-03-23 ENCOUNTER — Telehealth: Payer: Self-pay | Admitting: *Deleted

## 2013-03-23 ENCOUNTER — Encounter: Payer: Self-pay | Admitting: Physician Assistant

## 2013-03-23 VITALS — BP 126/88 | HR 80 | Ht 66.0 in | Wt 257.0 lb

## 2013-03-23 DIAGNOSIS — I5181 Takotsubo syndrome: Secondary | ICD-10-CM

## 2013-03-23 DIAGNOSIS — I712 Thoracic aortic aneurysm, without rupture: Secondary | ICD-10-CM

## 2013-03-23 DIAGNOSIS — K219 Gastro-esophageal reflux disease without esophagitis: Secondary | ICD-10-CM

## 2013-03-23 DIAGNOSIS — I10 Essential (primary) hypertension: Secondary | ICD-10-CM

## 2013-03-23 LAB — LIPID PANEL
HDL: 40.2 mg/dL (ref >39.00)
LDL Cholesterol: 59 mg/dL (ref 0–99)
VLDL: 8.2 mg/dL (ref 0.0–40.0)

## 2013-03-23 LAB — BASIC METABOLIC PANEL
BUN: 12 mg/dL (ref 6–23)
Calcium: 8.7 mg/dL (ref 8.4–10.5)
GFR: 103.11 mL/min (ref >60.00)
Glucose, Bld: 88 mg/dL (ref 70–99)
Potassium: 4 mEq/L (ref 3.5–5.1)
Sodium: 137 mEq/L (ref 135–145)

## 2013-03-23 LAB — HEPATIC FUNCTION PANEL
ALT: 12 U/L (ref 0–35)
AST: 18 U/L (ref 0–37)
Bilirubin, Direct: 0.1 mg/dL (ref 0.0–0.3)
Total Bilirubin: 0.5 mg/dL (ref 0.3–1.2)
Total Protein: 7.3 g/dL (ref 6.0–8.3)

## 2013-03-23 MED ORDER — OMEPRAZOLE 20 MG PO CPDR: 20.0000 mg | DELAYED_RELEASE_CAPSULE | Freq: Every day | ORAL | Status: DC

## 2013-03-23 MED ORDER — LISINOPRIL 5 MG PO TABS: 5.0000 mg | ORAL_TABLET | Freq: Every day | ORAL | Status: DC

## 2013-03-23 MED ORDER — ATORVASTATIN CALCIUM 40 MG PO TABS: 40.0000 mg | ORAL_TABLET | Freq: Every day | ORAL | Status: DC

## 2013-03-23 NOTE — Telephone Encounter (Signed)
lmptcb on pt's mother's cell phone since pt's phone does not accept calls from blocked #'s. I asked for ptcb for test results.

## 2013-03-23 NOTE — Patient Instructions (Addendum)
Your physician has requested that you have an echocardiogram. TO BE DONE 1 WEEK BEFORE F/U VISIT WITH DR. Clifton James; DX 425.4. Echocardiography is a painless test that uses sound waves to create images of your heart. It provides your doctor with information about the size and shape of your heart and how well your heart's chambers and valves are working. This procedure takes approximately one hour. There are no restrictions for this procedure.  DECREASE LIPITOR TO 40 MG EVERY NIGHT  TRY TAKING YOUR LISINOPRIL AT BEDTIME  LAB WORK, BMET  FASTING LIPID AND LIVER PANEL TO BE DONE IN 6 WEEKS  YOU CAN TRY CLARITIN, TYLENOL, OR ROBITUSSIN DM FOR YOUR COLD  PLEASE FOLLOW UP WITH DR. Clifton James IN ABOUT 6-8 WEEKS

## 2013-03-23 NOTE — Progress Notes (Signed)
[address], [address] [place], [place]  [address] Phone: [phone] Fax:  [phone]  Date:  03/23/2013   ID:  Deborah Williamson, DOB 1960-10-07, MRN [id number]  PCP:  Delanna Notice  Cardiologist:  Dr. Verne Carrow     History of Present Illness: Deborah Williamson is a 52 y.o. female with a hx of HTN, depression/anxiety. She was admitted to the hospital 11/25-11/27 with a non-STEMI. LHC (02/16/2013): No angiographic CAD, EF 35% with anteroapical, apical and inferoapical HK.  Echocardiogram (02/17/2013): EF 30-35%, apical septal AK, apical lateral AK, apical inferior AK, apical anterior AK, AK of the true apex, grade 1 diastolic dysfunction, trivial MR, normal RVSF, PASP 50.  Chest/abdominal/pelvic CTA (02/16/2013): No dissection, mild aneurysmal dilatation of the ascending thoracic aorta, no pulmonary embolism, 5 x 2.5 cm left ovarian cyst, mild patchy lower lobe airspace disease (left greater than right).  Overall picture was felt to be consistent with stress-induced cardiomyopathy. She was placed on beta blocker and ACEI. She was asked to follow up with her PCP for ovarian cyst.  Since d/c, she has developed URI symptoms.  She denies a productive cough or fever.  She denies chest pain, significant dyspnea, syncope, orthopnea, PND, edema.  She does feel hot at times and has been nauseated after taking her medications.   Recent Labs: 02/16/2013: ALT 7; Creatinine 0.63; Potassium 3.8; Pro B Natriuretic peptide (BNP) 1468.0*; TSH 2.122  02/17/2013: HDL 42; LDL (calc) 98  02/18/2013: Hemoglobin 10.5*   Wt Readings from Last 3 Encounters:  03/23/13 257 lb (116.574 kg)  02/17/13 259 lb 11.2 oz (117.8 kg)  02/17/13 259 lb 11.2 oz (117.8 kg)     Past Medical History  Diagnosis Date  . Hypertension   . Depression   . Anxiety   . Dysrhythmia     "irregular" (02/17/2013)  . NSTEMI (non-ST elevated myocardial infarction) 02/16/2013    Takotsubo cardiomyopathy,  normal cors  . Normocytic anemia   . Headache(784.0)     "probably once/week" (02/17/2013)  . Migraine     "probably once/week" (02/17/2013)  . 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 2D echo 02/17/13: EF 30-35%, periapical AK, normal RV size/function, moderate pulmonary HTN. Pattern was suggestive of Takotsubo CM.   Marland Kitchen Left ovarian cyst 01/2013    Incidental CT finding  . Ascending aortic aneurysm 01/2013    3.5-4 cm noted on CT-A    Current Outpatient Prescriptions  Medication Sig Dispense Refill  . aspirin EC 81 MG EC tablet Take 1 tablet (81 mg total) by mouth daily.      Marland Kitchen atorvastatin (LIPITOR) 80 MG tablet Take 1 tablet (80 mg total) by mouth daily at 6 PM.  30 tablet  3  . Calcium Carbonate Antacid (TUMS PO) Take 2 tablets by mouth daily as needed (heartburn).      . carvedilol (COREG) 3.125 MG tablet Take 1 tablet (3.125 mg total) by mouth 2 (two) times daily with a meal.  60 tablet  3  . eszopiclone (LUNESTA) 1 MG TABS tablet Take 1 mg by mouth at bedtime as needed for sleep. Take immediately before bedtime      . lisinopril (PRINIVIL,ZESTRIL) 5 MG tablet Take 1 tablet (5 mg total) by mouth daily.  30 tablet  3  . omeprazole (PRILOSEC) 10 MG capsule Take 10 mg by mouth daily as needed (heartburn).       No current facility-administered medications for this visit.    Allergies:  Review of patient's allergies indicates no known allergies.   Social History:  The patient  reports that she has never smoked. She has never used smokeless tobacco. She reports that she does not drink alcohol or use illicit drugs.   Family History:  The patient's family history includes Bipolar disorder in her daughter; Breast cancer in her maternal aunt and mother; Depression in her sister; Diabetes in her paternal grandmother; Hypertension in her mother; Prostate cancer in her maternal uncle and maternal uncle; Stroke in her paternal grandmother.   ROS:  Please see the history of present illness.    She had a light menstrual cycle after d/c.  She denies any other bleeding issues.   All other systems reviewed and negative.   PHYSICAL EXAM: VS:  BP 126/88  Pulse 80  Ht 5\' 6"  (1.676 m)  Wt 257 lb (116.574 kg)  BMI 41.50 kg/m2 Well nourished, well developed, in no acute distress HEENT: normal Neck: no JVD Cardiac:  normal S1, S2; RRR; no murmur Lungs:  clear to auscultation bilaterally, no wheezing, rhonchi or rales Abd: soft, nontender, no hepatomegaly Ext: no edema; right groin without hematoma or bruit  Skin: warm and dry Neuro:  CNs 2-12 intact, no focal abnormalities noted  EKG:  NSR, HR 72, normal axis, diffuse inf and anterolateral TWI.     ASSESSMENT AND PLAN:  1. Tako-Tsubo Cardiomyopathy:  She is doing well since d/c.  We discussed the typical prognosis for this problem.  Continue beta blocker, ACEI.  Check f/u BMET as her ACEI is new.  She has been nauseated.  I have asked her to take the Lisinopril at night to see if this helps.  I will also start her on Prilosec 20 QD.  I will also decrease her Lipitor to 40 QD.  I will arrange a follow up Echo in 6 weeks.  Check Lipids and LFTs in 6 weeks.   2. Hypertension: controlled.  Check BMET.  Continue current Rx. 3. Ovarian Cyst:  F/u with primary care. 4. Dilated Ascending Thoracic Aorta:  Consider f/u chest CTA in 6-12 mos. 5. GERD:  Resume Prilosec 20 QD to see if this helps her nausea. 6. URI:  We discussed conservative management (ie claritin, tylenol, robitussin, etc).   7. Disposition:  F/u with Dr. Verne Carrow in 6-8 weeks after her echo is done.  Signed, Tereso Newcomer, PA-C  03/23/2013 11:13 AM

## 2013-03-24 NOTE — Telephone Encounter (Signed)
Ptcb today and has been notified about results with verbal understanding

## 2013-05-07 ENCOUNTER — Ambulatory Visit (INDEPENDENT_AMBULATORY_CARE_PROVIDER_SITE_OTHER): Payer: 59 | Admitting: *Deleted

## 2013-05-07 ENCOUNTER — Encounter: Payer: Self-pay | Admitting: Cardiovascular Disease

## 2013-05-07 ENCOUNTER — Ambulatory Visit (HOSPITAL_COMMUNITY): Payer: BC Managed Care – PPO | Attending: Cardiology | Admitting: Cardiology

## 2013-05-07 DIAGNOSIS — I252 Old myocardial infarction: Secondary | ICD-10-CM | POA: Diagnosis not present

## 2013-05-07 DIAGNOSIS — I059 Rheumatic mitral valve disease, unspecified: Secondary | ICD-10-CM | POA: Insufficient documentation

## 2013-05-07 DIAGNOSIS — I5181 Takotsubo syndrome: Secondary | ICD-10-CM | POA: Diagnosis present

## 2013-05-07 DIAGNOSIS — I712 Thoracic aortic aneurysm, without rupture, unspecified: Secondary | ICD-10-CM

## 2013-05-07 DIAGNOSIS — I7121 Aneurysm of the ascending aorta, without rupture: Secondary | ICD-10-CM

## 2013-05-07 DIAGNOSIS — I079 Rheumatic tricuspid valve disease, unspecified: Secondary | ICD-10-CM | POA: Diagnosis not present

## 2013-05-07 DIAGNOSIS — I10 Essential (primary) hypertension: Secondary | ICD-10-CM

## 2013-05-07 LAB — HEPATIC FUNCTION PANEL
ALT: 11 U/L (ref 0–35)
AST: 16 U/L (ref 0–37)
Albumin: 3.4 g/dL — ABNORMAL LOW (ref 3.5–5.2)
Alkaline Phosphatase: 59 U/L (ref 39–117)
Bilirubin, Direct: 0.1 mg/dL (ref 0.0–0.3)
TOTAL PROTEIN: 7.3 g/dL (ref 6.0–8.3)
Total Bilirubin: 0.7 mg/dL (ref 0.3–1.2)

## 2013-05-07 LAB — LIPID PANEL
Cholesterol: 108 mg/dL (ref 0–200)
HDL: 42.3 mg/dL (ref >39.00)
LDL Cholesterol: 60 mg/dL (ref 0–99)
Total CHOL/HDL Ratio: 3
Triglycerides: 30 mg/dL (ref 0.0–149.0)
VLDL: 6 mg/dL (ref 0.0–40.0)

## 2013-05-07 NOTE — Progress Notes (Signed)
Echo performed. 

## 2013-05-10 ENCOUNTER — Telehealth: Payer: Self-pay | Admitting: *Deleted

## 2013-05-10 ENCOUNTER — Encounter: Payer: Self-pay | Admitting: Physician Assistant

## 2013-05-10 NOTE — Telephone Encounter (Signed)
lmom EF now normal , no changes to be made

## 2013-05-12 ENCOUNTER — Telehealth: Payer: Self-pay | Admitting: Cardiovascular Disease

## 2013-05-12 NOTE — Telephone Encounter (Signed)
Spoke with pt and reviewed lipid and liver results with her.

## 2013-05-12 NOTE — Telephone Encounter (Signed)
New message ° ° ° ° °Returning a call to the nurse °

## 2013-05-14 ENCOUNTER — Other Ambulatory Visit (HOSPITAL_COMMUNITY): Payer: Self-pay

## 2013-05-21 ENCOUNTER — Ambulatory Visit (INDEPENDENT_AMBULATORY_CARE_PROVIDER_SITE_OTHER): Payer: BC Managed Care – PPO | Admitting: Cardiovascular Disease

## 2013-05-21 ENCOUNTER — Encounter: Payer: Self-pay | Admitting: Cardiovascular Disease

## 2013-05-21 VITALS — BP 140/84 | HR 66 | Ht 66.0 in | Wt 256.0 lb

## 2013-05-21 DIAGNOSIS — I712 Thoracic aortic aneurysm, without rupture, unspecified: Secondary | ICD-10-CM | POA: Diagnosis not present

## 2013-05-21 DIAGNOSIS — I5181 Takotsubo syndrome: Secondary | ICD-10-CM

## 2013-05-21 DIAGNOSIS — I10 Essential (primary) hypertension: Secondary | ICD-10-CM

## 2013-05-21 DIAGNOSIS — I7121 Aneurysm of the ascending aorta, without rupture: Secondary | ICD-10-CM

## 2013-05-21 MED ORDER — LOSARTAN POTASSIUM 50 MG PO TABS: 50.0000 mg | ORAL_TABLET | Freq: Every day | ORAL | Status: DC

## 2013-05-21 NOTE — Patient Instructions (Addendum)
Your physician wants you to follow-up in: 12 months.  You will receive a reminder letter in the mail two months in advance. If you don't receive a letter, please call our office to schedule the follow-up appointment.  Your physician has recommended you make the following change in your medication:  Stop Lisinopril.  Start Cozaar 50 mg by mouth daily

## 2013-05-21 NOTE — Progress Notes (Signed)
History of Present Illness: 53 y.o. female with a history of HTN, depression/anxiety and non-ischemic cardiomyopathy who is here today for cardiac follow up.  She was admitted to the hospital 11/25-11/27/14 with a non-STEMI. Cardiac cath 02/16/2013 with no angiographic evidence of CAD, LVEF 35% with anteroapical, apical and inferoapical HK. Echocardiogram (02/17/2013): EF 30-35%, apical septal AK, apical lateral AK, apical inferior AK, apical anterior AK, AK of the true apex, grade 1 diastolic dysfunction, trivial MR, normal RVSF, PASP 50. Chest/abdominal/pelvic CTA (02/16/2013): No dissection, mild aneurysmal dilatation of the ascending thoracic aorta, no pulmonary embolism, 5 x 2.5 cm left ovarian cyst, mild patchy lower lobe airspace disease (left greater than right). Overall picture was felt to be consistent with stress-induced cardiomyopathy. She was placed on beta blocker and ACEI. Echo 05/07/13 with resolution of LV function, now normal.   She is here today for follow up. She denies chest pain, significant dyspnea, syncope, orthopnea, PND, edema. She has a dry cough all day long on Lisinopril.   Primary Care Physician: Laurell Josephsickett, Kathy  Last Lipid Profile:Lipid Panel     Component Value Date/Time   CHOL 108 05/07/2013 1057   TRIG 30.0 05/07/2013 1057   HDL 42.30 05/07/2013 1057   CHOLHDL 3 05/07/2013 1057   VLDL 6.0 05/07/2013 1057   LDLCALC 60 05/07/2013 1057     Past Medical History  Diagnosis Date  . Hypertension   . Depression   . Anxiety   . Dysrhythmia     "irregular" (02/17/2013)  . NSTEMI (non-ST elevated myocardial infarction) 02/16/2013    Takotsubo cardiomyopathy, normal cors  . Normocytic anemia   . Headache(784.0)     "probably once/week" (02/17/2013)  . Migraine     "probably once/week" (02/17/2013)  . 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 2D echo 02/17/13: EF 30-35%, periapical AK, normal RV size/function, moderate pulmonary HTN. c/w Takotsubo CM. ;  b.  Echo  (04/2013): EF 55%, no WMA, Gr 1 DD, mildly dilated ascending aorta (39 mm), mild MR, mild BAE, PASP 33  . Left ovarian cyst 01/2013    Incidental CT finding  . Ascending aortic aneurysm 01/2013    3.5-4 cm noted on CT-A    Past Surgical History  Procedure Laterality Date  . Brachioplasty Bilateral 05/2011    Plastic Surgery to remove loose skin after Weight Loss  . Tubal ligation  1989  . Cardiac catheterization  02/17/2013    no angiographic evidence of CAD, EF 35%, HK or the anteroapical wall, apex and inferoapical wall    Current Outpatient Prescriptions  Medication Sig Dispense Refill  . aspirin EC 81 MG EC tablet Take 1 tablet (81 mg total) by mouth daily.      Marland Kitchen. atorvastatin (LIPITOR) 40 MG tablet Take 1 tablet (40 mg total) by mouth daily at 6 PM.  30 tablet  11  . Calcium Carbonate Antacid (TUMS PO) Take 2 tablets by mouth daily as needed (heartburn).      . carvedilol (COREG) 3.125 MG tablet Take 1 tablet (3.125 mg total) by mouth 2 (two) times daily with a meal.  60 tablet  3  . eszopiclone (LUNESTA) 1 MG TABS tablet Take 1 mg by mouth at bedtime as needed for sleep. Take immediately before bedtime      . lisinopril (PRINIVIL,ZESTRIL) 5 MG tablet Take 1 tablet (5 mg total) by mouth at bedtime.  30 tablet  3  . omeprazole (PRILOSEC) 20 MG capsule Take 1 capsule (20 mg  total) by mouth daily.  30 capsule  6   No current facility-administered medications for this visit.    No Known Allergies  History   Social History  . Marital Status: Married    Spouse Name: N/A    Number of Children: N/A  . Years of Education: N/A   Occupational History  . Not on file.   Social History Main Topics  . Smoking status: Never Smoker   . Smokeless tobacco: Never Used  . Alcohol Use: No  . Drug Use: No  . Sexual Activity: Yes   Other Topics Concern  . Not on file   Social History Narrative  . No narrative on file    Family History  Problem Relation Age of Onset  .  Hypertension Mother   . Breast cancer Mother   . Diabetes Paternal Grandmother   . Stroke Paternal Grandmother   . Breast cancer Maternal Aunt   . Prostate cancer Maternal Uncle   . Prostate cancer Maternal Uncle   . Depression Sister   . Bipolar disorder Daughter     Review of Systems:  As stated in the HPI and otherwise negative.   BP 140/84  Pulse 66  Ht 5\' 6"  (1.676 m)  Wt 256 lb (116.121 kg)  BMI 41.34 kg/m2  Physical Examination: General: Well developed, well nourished, NAD HEENT: OP clear, mucus membranes moist SKIN: warm, dry. No rashes. Neuro: No focal deficits Musculoskeletal: Muscle strength 5/5 all ext Psychiatric: Mood and affect normal Neck: No JVD, no carotid bruits, no thyromegaly, no lymphadenopathy. Lungs:Clear bilaterally, no wheezes, rhonci, crackles Cardiovascular: Regular rate and rhythm. No murmurs, gallops or rubs. Abdomen:Soft. Bowel sounds present. Non-tender.  Extremities: No lower extremity edema. Pulses are 2 + in the bilateral DP/PT.  Echo 05/07/13: Left ventricle: The cavity size was normal. Wall thickness was normal. The estimated ejection fraction was 55%. Wall motion was normal; there were no regional wall motion abnormalities. Doppler parameters are consistent with abnormal left ventricular relaxation (grade 1 diastolic dysfunction). - Aortic valve: There was no stenosis. - Aorta: Ascending aortic diameter: 39mm (S). - Ascending aorta: The ascending aorta was mildly dilated. - Mitral valve: Mild regurgitation. - Left atrium: The atrium was mildly dilated. - Right ventricle: The cavity size was normal. Systolic function was normal. - Right atrium: The atrium was mildly dilated. - Tricuspid valve: Peak RV-RA gradient:46mm Hg (S). - Pulmonary arteries: PA peak pressure: 33mm Hg (S). - Inferior vena cava: The vessel was normal in size; the respirophasic diameter changes were in the normal range (= 50%); findings are consistent with  normal central venous pressure. Impressions:  - Normal LV size and systolic function, EF 55%. Normal RV size and systolic function. Mild mitral regurgitation.  Assessment and Plan:   1. Non-ischemic cardiomyopathy: Likely takotsubo cardiomyopathy given presentation in November 2014. LVEF was noted to be 35% by echo November 2014. Repeat echo February 2015 with normal LV function. Will continue medical therapy but she has a dry cough with Lisinopril. Will change to Cozaar 50mg  po Qdaily.Continue Coreg.   2. HTN: BP controlled. No changes.   3. Thoracic aortic aneurysm: Mild dilation of ascending thoracic aorta 4.0 cm November 2014. Will repeat CTA chest in one year.

## 2013-06-30 ENCOUNTER — Other Ambulatory Visit: Payer: Self-pay | Admitting: *Deleted

## 2013-06-30 MED ORDER — CARVEDILOL 3.125 MG PO TABS: 3.1250 mg | ORAL_TABLET | Freq: Two times a day (BID) | ORAL | Status: DC

## 2013-06-30 NOTE — Telephone Encounter (Signed)
Open in error

## 2014-03-03 ENCOUNTER — Encounter (HOSPITAL_COMMUNITY): Payer: Self-pay | Admitting: Cardiovascular Disease

## 2014-05-07 ENCOUNTER — Emergency Department (HOSPITAL_COMMUNITY)
Admission: EM | Admit: 2014-05-07 | Discharge: 2014-05-07 | Disposition: A | Discharge status: Home / Self Care | Payer: Self-pay | Attending: Emergency Medicine | Admitting: Emergency Medicine

## 2014-05-07 ENCOUNTER — Emergency Department (HOSPITAL_COMMUNITY): Payer: Self-pay

## 2014-05-07 ENCOUNTER — Encounter (HOSPITAL_COMMUNITY): Payer: Self-pay | Admitting: Radiology

## 2014-05-07 ENCOUNTER — Emergency Department (HOSPITAL_COMMUNITY): Payer: 59

## 2014-05-07 DIAGNOSIS — Z8742 Personal history of other diseases of the female genital tract: Secondary | ICD-10-CM | POA: Insufficient documentation

## 2014-05-07 DIAGNOSIS — Z9889 Other specified postprocedural states: Secondary | ICD-10-CM | POA: Insufficient documentation

## 2014-05-07 DIAGNOSIS — I10 Essential (primary) hypertension: Secondary | ICD-10-CM | POA: Insufficient documentation

## 2014-05-07 DIAGNOSIS — M79604 Pain in right leg: Secondary | ICD-10-CM

## 2014-05-07 DIAGNOSIS — I252 Old myocardial infarction: Secondary | ICD-10-CM | POA: Insufficient documentation

## 2014-05-07 DIAGNOSIS — Z862 Personal history of diseases of the blood and blood-forming organs and certain disorders involving the immune mechanism: Secondary | ICD-10-CM | POA: Insufficient documentation

## 2014-05-07 DIAGNOSIS — M79601 Pain in right arm: Secondary | ICD-10-CM | POA: Insufficient documentation

## 2014-05-07 DIAGNOSIS — R531 Weakness: Secondary | ICD-10-CM | POA: Insufficient documentation

## 2014-05-07 DIAGNOSIS — Z79899 Other long term (current) drug therapy: Secondary | ICD-10-CM | POA: Insufficient documentation

## 2014-05-07 DIAGNOSIS — Z8659 Personal history of other mental and behavioral disorders: Secondary | ICD-10-CM | POA: Insufficient documentation

## 2014-05-07 DIAGNOSIS — Z7982 Long term (current) use of aspirin: Secondary | ICD-10-CM | POA: Insufficient documentation

## 2014-05-07 LAB — I-STAT CHEM 8, ED
BUN: 11 mg/dL (ref 6–23)
CALCIUM ION: 1.12 mmol/L (ref 1.12–1.23)
CREATININE: 0.8 mg/dL (ref 0.50–1.10)
Chloride: 106 mmol/L (ref 96–112)
Glucose, Bld: 100 mg/dL — ABNORMAL HIGH (ref 70–99)
HEMATOCRIT: 39 % (ref 36.0–46.0)
HEMOGLOBIN: 13.3 g/dL (ref 12.0–15.0)
POTASSIUM: 3.7 mmol/L (ref 3.5–5.1)
Sodium: 139 mmol/L (ref 135–145)
TCO2: 21 mmol/L (ref 0–100)

## 2014-05-07 LAB — CBC WITH DIFFERENTIAL/PLATELET
BASOS PCT: 0 % (ref 0–1)
Basophils Absolute: 0 10*3/uL (ref 0.0–0.1)
Eosinophils Absolute: 0.1 10*3/uL (ref 0.0–0.7)
Eosinophils Relative: 3 % (ref 0–5)
HCT: 33.9 % — ABNORMAL LOW (ref 36.0–46.0)
Hemoglobin: 11.2 g/dL — ABNORMAL LOW (ref 12.0–15.0)
LYMPHS PCT: 38 % (ref 12–46)
Lymphs Abs: 2 10*3/uL (ref 0.7–4.0)
MCH: 26.4 pg (ref 26.0–34.0)
MCHC: 33 g/dL (ref 30.0–36.0)
MCV: 80 fL (ref 78.0–100.0)
Monocytes Absolute: 0.5 10*3/uL (ref 0.1–1.0)
Monocytes Relative: 8 % (ref 3–12)
Neutro Abs: 2.7 10*3/uL (ref 1.7–7.7)
Neutrophils Relative %: 51 % (ref 43–77)
PLATELETS: 274 10*3/uL (ref 150–400)
RBC: 4.24 MIL/uL (ref 3.87–5.11)
RDW: 14.3 % (ref 11.5–15.5)
WBC: 5.4 10*3/uL (ref 4.0–10.5)

## 2014-05-07 LAB — PROTIME-INR
INR: 0.99 (ref 0.00–1.49)
Prothrombin Time: 13.2 seconds (ref 11.6–15.2)

## 2014-05-07 LAB — BASIC METABOLIC PANEL
Anion gap: 6 (ref 5–15)
BUN: 8 mg/dL (ref 6–23)
CO2: 24 mmol/L (ref 19–32)
Calcium: 8.2 mg/dL — ABNORMAL LOW (ref 8.4–10.5)
Chloride: 106 mmol/L (ref 96–112)
Creatinine, Ser: 0.9 mg/dL (ref 0.50–1.10)
GFR calc non Af Amer: 72 mL/min — ABNORMAL LOW (ref >90)
GFR, EST AFRICAN AMERICAN: 84 mL/min — AB (ref >90)
Glucose, Bld: 103 mg/dL — ABNORMAL HIGH (ref 70–99)
POTASSIUM: 3.6 mmol/L (ref 3.5–5.1)
Sodium: 136 mmol/L (ref 135–145)

## 2014-05-07 LAB — I-STAT CG4 LACTIC ACID, ED: LACTIC ACID, VENOUS: 1.11 mmol/L (ref 0.5–2.0)

## 2014-05-07 LAB — TROPONIN I

## 2014-05-07 LAB — CK: Total CK: 123 U/L (ref 7–177)

## 2014-05-07 LAB — D-DIMER, QUANTITATIVE: D-Dimer, Quant: 1.12 ug/mL-FEU — ABNORMAL HIGH (ref 0.00–0.48)

## 2014-05-07 MED ORDER — SODIUM CHLORIDE 0.9 % IV BOLUS (SEPSIS)
1000.0000 mL | Freq: Once | INTRAVENOUS | Status: AC
  Administered 2014-05-07: 1000 mL via INTRAVENOUS

## 2014-05-07 MED ORDER — IOHEXOL 350 MG/ML SOLN
100.0000 mL | Freq: Once | INTRAVENOUS | Status: AC | PRN
  Administered 2014-05-07: 100 mL via INTRAVENOUS

## 2014-05-07 MED ORDER — MORPHINE SULFATE 4 MG/ML IJ SOLN
4.0000 mg | Freq: Once | INTRAMUSCULAR | Status: AC
  Administered 2014-05-07: 4 mg via INTRAVENOUS
  Filled 2014-05-07: qty 1

## 2014-05-07 MED ORDER — LORAZEPAM 2 MG/ML IJ SOLN
1.0000 mg | Freq: Once | INTRAMUSCULAR | Status: AC
  Administered 2014-05-07: 1 mg via INTRAVENOUS
  Filled 2014-05-07: qty 1

## 2014-05-07 MED ORDER — OXYCODONE-ACETAMINOPHEN 5-325 MG PO TABS
2.0000 | ORAL_TABLET | Freq: Once | ORAL | Status: AC
  Administered 2014-05-07: 2 via ORAL
  Filled 2014-05-07: qty 2

## 2014-05-07 NOTE — ED Notes (Signed)
Pt is stable upon d/c and is escorted from ED by staff via wheelchair. Pt verbalizes understanding rt d/c instructions.

## 2014-05-07 NOTE — Progress Notes (Signed)
VASCULAR LAB PRELIMINARY  PRELIMINARY  PRELIMINARY  PRELIMINARY  Right lower extremity venous Doppler completed.    Preliminary report:  There is no DVT, SVT, or Baker's cyst noted in the right lower extremity.   Sou Nohr, RVT 05/07/2014, 11:22 AM

## 2014-05-07 NOTE — ED Notes (Signed)
Pt being transported to MRI.

## 2014-05-07 NOTE — ED Notes (Signed)
Pt at vascular

## 2014-05-07 NOTE — ED Notes (Addendum)
1 month of rt. Leg weakness; x 2 weeks rt. Arm weakness; some bruising on both; "feels like their is no blood circulation. Rt. Lower leg swelling > left leg.

## 2014-05-07 NOTE — ED Notes (Signed)
Pt returned from Ct.

## 2014-05-07 NOTE — ED Provider Notes (Signed)
CSN: [id number]     Arrival date & time 05/07/14  0751 History   First MD Initiated Contact with Patient 05/07/14 [phone]     No chief complaint on file.    (Consider location/radiation/quality/duration/timing/severity/associated sxs/prior Treatment) HPI   54 year old female with a significant cardiac history including MI, ascending aortic aneurysm, Takotsubo cardiomyopathy presenting complaining of right arm and leg weakness. Patient states for the past month she has had persistent soreness to her right calf with right leg weakness. Symptom is getting progressively worse. For the past 2 weeks she also reported having right arm pain which she describes a throbbing sensation and a sensation of "I'm not getting enough blood to my arm and leg". She noticed bruising to her arms and legs that is sensitive to palpation but denies being on blood thinner medication or have any injury. She also report having headache but no fever, double vision, neck pain, chest pain, shortness of breath, lightheadedness, dizziness, diaphoresis. She denies prior history of PE DVT, no recent surgery, prolonged bed rest, hemoptysis, or taken oral hormone. She is scheduled to follow-up with her cardiologist next week.  Past Medical History  Diagnosis Date  . Hypertension   . Depression   . Anxiety   . Dysrhythmia     "irregular" (02/17/2013)  . NSTEMI (non-ST elevated myocardial infarction) 02/16/2013    Takotsubo cardiomyopathy, normal cors  . Normocytic anemia   . Headache(784.0)     "probably once/week" (02/17/2013)  . Migraine     "probably once/week" (02/17/2013)  . 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 2D echo 02/17/13: EF 30-35%, periapical AK, normal RV size/function, moderate pulmonary HTN. c/w Takotsubo CM. ;  b.  Echo (04/2013): EF 55%, no WMA, Gr 1 DD, mildly dilated ascending aorta (39 mm), mild MR, mild BAE, PASP 33  . Left ovarian cyst 01/2013    Incidental CT finding  . Ascending aortic aneurysm  01/2013    3.5-4 cm noted on CT-A   Past Surgical History  Procedure Laterality Date  . Brachioplasty Bilateral 05/2011    Plastic Surgery to remove loose skin after Weight Loss  . Tubal ligation  1989  . Cardiac catheterization  02/17/2013    no angiographic evidence of CAD, EF 35%, HK or the anteroapical wall, apex and inferoapical wall  . Left heart catheterization with coronary angiogram N/A 02/17/2013    Procedure: LEFT HEART CATHETERIZATION WITH CORONARY ANGIOGRAM;  Surgeon: Kathleene Hazel, MD;  Location: [facility], [place] CATH LAB;  Service: Cardiovascular;  Laterality: N/A;   Family History  Problem Relation Age of Onset  . Hypertension Mother   . Breast cancer Mother   . Diabetes Paternal Grandmother   . Stroke Paternal Grandmother   . Breast cancer Maternal Aunt   . Prostate cancer Maternal Uncle   . Prostate cancer Maternal Uncle   . Depression Sister   . Bipolar disorder Daughter    History  Substance Use Topics  . Smoking status: Never Smoker   . Smokeless tobacco: Never Used  . Alcohol Use: No   OB History    No data available     Review of Systems  All other systems reviewed and are negative.     Allergies  Lisinopril  Home Medications   Prior to Admission medications   Medication Sig Start Date End Date Taking? Authorizing Provider  aspirin EC 81 MG EC tablet Take 1 tablet (81 mg total) by mouth daily. 02/18/13   Roger A Arguello, PA-C  atorvastatin (LIPITOR)  40 MG tablet Take 1 tablet (40 mg total) by mouth daily at 6 PM. 03/23/13   Beatrice Lecher, PA-C  Calcium Carbonate Antacid (TUMS PO) Take 2 tablets by mouth daily as needed (heartburn).    Historical Provider, MD  carvedilol (COREG) 3.125 MG tablet Take 1 tablet (3.125 mg total) by mouth 2 (two) times daily with a meal. 06/30/13   Kathleene Hazel, MD  eszopiclone (LUNESTA) 1 MG TABS tablet Take 1 mg by mouth at bedtime as needed for sleep. Take immediately before bedtime    Historical Provider,  MD  losartan (COZAAR) 50 MG tablet Take 1 tablet (50 mg total) by mouth daily. 05/21/13   Kathleene Hazel, MD  omeprazole (PRILOSEC) 20 MG capsule Take 1 capsule (20 mg total) by mouth daily. 03/23/13   Beatrice Lecher, PA-C   There were no vitals taken for this visit. Physical Exam  Constitutional: She is oriented to person, place, and time. She appears well-developed and well-nourished. No distress.  HENT:  Head: Atraumatic.  Eyes: Conjunctivae are normal.  Neck: Neck supple.  No nuchal rigidity  Cardiovascular: Normal rate, regular rhythm and intact distal pulses.   Murmur heard. Pulmonary/Chest: Effort normal and breath sounds normal.  Abdominal: Soft. There is no tenderness.  Musculoskeletal: She exhibits tenderness (Right arm with mild diffuse tenderness throughout on without focal point tenderness. Normal grip strength, intact radial pulses, brisk cap refill to all fingers. Right leg, tenderness noted to right calf on palpation, no palpable cords, erythema or edema.). She exhibits no edema.  5/5 strength to all 4 extremities with intact distal pulses.  Brisk cap refills throughout.    Neurological: She is alert and oriented to person, place, and time.  Skin: No rash noted.  Psychiatric: She has a normal mood and affect.  Nursing note and vitals reviewed.   ED Course  Procedures (including critical care time)  8:14 AM Patient with history of aortic aneurysm who presents complaining of a month long sensation of right leg pain weakness along with 2 weeks worsening right arm pain and weakness. On examination, she has normal sensation throughout, normal strength. no chest pain or back pain concerning for aortic dissection. No appreciable abdominal bruit. Given her significant past medical history of AAA, a thorough workup was initiated. Workup includes chest abdomen and pelvis CT scan with angio, Right lower extremity venous Doppler, head CT scan.  2:30 PM Scan of her head chest  abdomen and pelvis shows no acute finding. AAA noted on CT, unchanged from prior. I have consult and spoke with neurologist in regards to patient's numbness and weakness sensation. He recommend brain MRI, if negative, patient can follow palpation full nerve conduction study. Her brain MRI result is unremarkable. Patient was made aware of her findings. Patient to follow-up outpatient for further care. She is able to ambulate. Patient stable for discharge.     Secret, Kristensen Female Oct 23, 1960 [id number]    Progress Notes by Kern Alberta, RVS at 05/07/2014 11:22 AM    Author: Kern Alberta, RVS Service: Vascular Lab Author Type: Cardiovascular Sonographer   Filed: 05/07/2014 11:23 AM Note Time: 05/07/2014 11:22 AM Status: Signed   Editor: Kern Alberta, RVS (Cardiovascular Sonographer)     Expand All Collapse All   VASCULAR LAB PRELIMINARY PRELIMINARY PRELIMINARY PRELIMINARY  Right lower extremity venous Doppler completed.   Preliminary report: There is no DVT, SVT, or Baker's cyst noted in the right lower extremity.   KANADY, CANDACE,  RVT 05/07/2014, 11:22 AM       Labs Review Labs Reviewed  CBC WITH DIFFERENTIAL/PLATELET - Abnormal; Notable for the following:    Hemoglobin 11.2 (*)    HCT 33.9 (*)    All other components within normal limits  D-DIMER, QUANTITATIVE - Abnormal; Notable for the following:    D-Dimer, Quant 1.12 (*)    All other components within normal limits  BASIC METABOLIC PANEL - Abnormal; Notable for the following:    Glucose, Bld 103 (*)    Calcium 8.2 (*)    GFR calc non Af Amer 72 (*)    GFR calc Af Amer 84 (*)    All other components within normal limits  I-STAT CHEM 8, ED - Abnormal; Notable for the following:    Glucose, Bld 100 (*)    All other components within normal limits  CK  TROPONIN I  PROTIME-INR  I-STAT CG4 LACTIC ACID, ED    Imaging Review Ct Head Wo Contrast  05/07/2014   CLINICAL DATA:  Extremity weakness.   EXAM: CT HEAD WITHOUT CONTRAST  TECHNIQUE: Contiguous axial images were obtained from the base of the skull through the vertex without intravenous contrast.  COMPARISON:  12/19/2011  FINDINGS: Skull and Sinuses:Negative for fracture or destructive process. The mastoids, middle ears, and imaged paranasal sinuses are clear.  Orbits: No acute abnormality.  Brain: No evidence of acute infarction, hemorrhage, hydrocephalus, or mass lesion/mass effect.  IMPRESSION: Negative head CT.   Electronically Signed   By: Marnee Spring M.D.   On: 05/07/2014 11:12   Mr Brain Wo Contrast  05/07/2014   CLINICAL DATA:  RIGHT-sided weakness. Symptoms for 2-4 weeks. Stroke risk factors include hypertension.  EXAM: MRI HEAD WITHOUT CONTRAST  TECHNIQUE: Multiplanar, multiecho pulse sequences of the brain and surrounding structures were obtained without intravenous contrast.  COMPARISON:  CT head earlier today.  MR head 12/22/2011.  FINDINGS: No evidence for acute infarction, hemorrhage, mass lesion, hydrocephalus, or extra-axial fluid. Normal cerebral volume. Minor white matter disease, nonspecific. Flow voids are maintained throughout the carotid, basilar, and vertebral arteries. There are no areas of chronic hemorrhage. Pituitary, pineal, and cerebellar tonsils unremarkable. No upper cervical lesions. Visualized calvarium, skull base, and upper cervical osseous structures unremarkable. Scalp and extracranial soft tissues, orbits, sinuses, and mastoids show no acute process.  IMPRESSION: Negative exam.  No significant interval change from priors.   Electronically Signed   By: Davonna Belling M.D.   On: 05/07/2014 14:06   Ct Angio Chest Aorta W/cm &/or Wo/cm  05/07/2014   CLINICAL DATA:  Cough with shortness of breath and chest pain. Right-sided weakness.  EXAM: CT ANGIOGRAPHY CHEST, ABDOMEN AND PELVIS  TECHNIQUE: Multidetector CT imaging through the chest, abdomen and pelvis was performed using the standard protocol during bolus  administration of intravenous contrast. Multiplanar reconstructed images and MIPs were obtained and reviewed to evaluate the vascular anatomy.  CONTRAST:  OMNIPAQUE IOHEXOL 350 MG/ML SOLN  COMPARISON:  02/16/2013  FINDINGS: CTA CHEST FINDINGS  THORACIC INLET/BODY WALL:  No acute abnormality.  MEDIASTINUM:  Mild cardiomegaly which is stable from prior. No pericardial effusion. No notable atheromatous changes.  Aortic imaging is limited by prominent cardiac motion. There is no evidence of intramural hematoma or dissection. The ascending aorta is mildly aneurysmal at 41 mm maximal diameter. This is 1 mm greater than on comparison study. The arch is 30 mm diameter and the descending aorta 22 mm diameter. There is no gross pulmonary artery filling defect.  No lymphadenopathy.  LUNG WINDOWS:  No consolidation.  No effusion.  No suspicious pulmonary nodule.  OSSEOUS:  No acute fracture.  No suspicious lytic or blastic lesions.  Review of the MIP images confirms the above findings.  CTA ABDOMEN AND PELVIS FINDINGS  BODY WALL: Unremarkable.  Liver: 1 cm triangular hypervascular focus in the subcapsular central liver is likely a small shunt or transient perfusion anomaly. Margins are not typical of the hypervascular mass.  Biliary: No evidence of biliary obstruction or stone.  Pancreas: Unremarkable.  Spleen: Unremarkable.  Adrenals: Unremarkable.  Kidneys and ureters: No hydronephrosis or stone. Sub cm presumed cyst in the interpolar right kidney.  Bladder: Unremarkable.  Reproductive: Heterogeneous enhancement in the posterior uterine body consistent with intramural fibroids. The largest discrete mass measures 33 mm. The ovaries are symmetric and unremarkable.  Bowel: No obstruction. Normal appendix.  Retroperitoneum: No mass or adenopathy.  Peritoneum: Trace free pelvic fluid which still could be physiologic based on age.  Vascular: No aortic aneurysm, dissection, or wall thickening. Standard aortic branching pattern  with no branch vessel stenosis or irregularity.  OSSEOUS: No acute abnormalities.  Review of the MIP images confirms the above findings.  IMPRESSION: 1. No acute aortic findings, including dissection. 2. Ascending aortic aneurysm with 41 mm diameter. Recommend annual imaging followup by CTA or MRA. This recommendation follows 2010 ACCF/AHA/AATS/ACR/ASA/SCA/SCAI/SIR/STS/SVM Guidelines for the Diagnosis and Management of Patients with Thoracic Aortic Disease. Circulation. 2010; 121: Z610-R604 3. Fibroid uterus.   Electronically Signed   By: Marnee Spring M.D.   On: 05/07/2014 11:26   Ct Cta Abd/pel W/cm &/or W/o Cm  05/07/2014   CLINICAL DATA:  Cough with shortness of breath and chest pain. Right-sided weakness.  EXAM: CT ANGIOGRAPHY CHEST, ABDOMEN AND PELVIS  TECHNIQUE: Multidetector CT imaging through the chest, abdomen and pelvis was performed using the standard protocol during bolus administration of intravenous contrast. Multiplanar reconstructed images and MIPs were obtained and reviewed to evaluate the vascular anatomy.  CONTRAST:  OMNIPAQUE IOHEXOL 350 MG/ML SOLN  COMPARISON:  02/16/2013  FINDINGS: CTA CHEST FINDINGS  THORACIC INLET/BODY WALL:  No acute abnormality.  MEDIASTINUM:  Mild cardiomegaly which is stable from prior. No pericardial effusion. No notable atheromatous changes.  Aortic imaging is limited by prominent cardiac motion. There is no evidence of intramural hematoma or dissection. The ascending aorta is mildly aneurysmal at 41 mm maximal diameter. This is 1 mm greater than on comparison study. The arch is 30 mm diameter and the descending aorta 22 mm diameter. There is no gross pulmonary artery filling defect. No lymphadenopathy.  LUNG WINDOWS:  No consolidation.  No effusion.  No suspicious pulmonary nodule.  OSSEOUS:  No acute fracture.  No suspicious lytic or blastic lesions.  Review of the MIP images confirms the above findings.  CTA ABDOMEN AND PELVIS FINDINGS  BODY WALL:  Unremarkable.  Liver: 1 cm triangular hypervascular focus in the subcapsular central liver is likely a small shunt or transient perfusion anomaly. Margins are not typical of the hypervascular mass.  Biliary: No evidence of biliary obstruction or stone.  Pancreas: Unremarkable.  Spleen: Unremarkable.  Adrenals: Unremarkable.  Kidneys and ureters: No hydronephrosis or stone. Sub cm presumed cyst in the interpolar right kidney.  Bladder: Unremarkable.  Reproductive: Heterogeneous enhancement in the posterior uterine body consistent with intramural fibroids. The largest discrete mass measures 33 mm. The ovaries are symmetric and unremarkable.  Bowel: No obstruction. Normal appendix.  Retroperitoneum: No mass or adenopathy.  Peritoneum: Trace  free pelvic fluid which still could be physiologic based on age.  Vascular: No aortic aneurysm, dissection, or wall thickening. Standard aortic branching pattern with no branch vessel stenosis or irregularity.  OSSEOUS: No acute abnormalities.  Review of the MIP images confirms the above findings.  IMPRESSION: 1. No acute aortic findings, including dissection. 2. Ascending aortic aneurysm with 41 mm diameter. Recommend annual imaging followup by CTA or MRA. This recommendation follows 2010 ACCF/AHA/AATS/ACR/ASA/SCA/SCAI/SIR/STS/SVM Guidelines for the Diagnosis and Management of Patients with Thoracic Aortic Disease. Circulation. 2010; 121: [id number] 3. Fibroid uterus.   Electronically Signed   By: Marnee SpringJonathon  Watts M.D.   On: 05/07/2014 11:26     EKG Interpretation   Date/Time:  Saturday May 07 2014 09:41:04 EST Ventricular Rate:  78 PR Interval:  190 QRS Duration: 94 QT Interval:  414 QTC Calculation: 472 R Axis:   15 Text Interpretation:  Sinus rhythm Probable anterior infarct, age  indeterminate No significant change was found Confirmed by Manus GunningANCOUR  MD,  STEPHEN [phone]([id number]) on 05/07/2014 9:52:18 AM      MDM   Final diagnoses:  Right sided weakness    BP  140/78 mmHg  Pulse 69  Temp(Src) 97.9 F (36.6 C) (Oral)  Resp 14  SpO2 100%  LMP 03/13/2014  I have reviewed nursing notes and vital signs. I personally reviewed the imaging tests through PACS system  I reviewed available ER/hospitalization records thought the EMR     Fayrene HelperBowie Mercer Peifer, PA-C 05/07/14 1513  Glynn OctaveStephen Rancour, MD 05/07/14 [phone]

## 2014-05-07 NOTE — ED Provider Notes (Signed)
Medical screening examination/treatment/procedure(s) were conducted as a shared visit with non-physician practitioner(s) and myself.  I personally evaluated the patient during the encounter.   EKG Interpretation   Date/Time:  Saturday May 07 2014 09:41:04 EST Ventricular Rate:  78 PR Interval:  190 QRS Duration: 94 QT Interval:  414 QTC Calculation: 472 R Axis:   15 Text Interpretation:  Sinus rhythm Probable anterior infarct, age  indeterminate No significant change was found Confirmed by Manus GunningANCOUR  MD,  Gertrude Bucks [phone]([id number]) on 05/07/2014 9:52:18 AM       HPI Comments: Linward HeadlandMarion D Kimberlin is a 54 y.o. female who presents to the Emergency Department complaining of 1 month of constant, gradually worsened right lower leg pain and weakness along with 2 weeks of gradually worsened RUE paresthesias and pain. She describes the RUE pain as throbbing and the RLE pain as cramping. She reports having a HA last night which has resolved. She denies recent falls or injuries. She denies history of DVT/PE. She denies CP, back pain, shoulder pain, neck pain.    Physical Examination: General appearance - alert, anxious appearing, and in no distress Mental status - alert, oriented to person, place, and time Cardiovascular - regular and rate and rhythm. Intact radial and dp pulses bilaterally  Neurological - cranial nerves 2-12 intact. 4/5 strength in right arm and leg with questionable effort, 5/5 strength on left, no ataxia on finger to nose. No nystagmus.  Musculoskeletal - Tenderness to palpation of right calf.   D-dimer positive. We'll check right leg for clot. Right sided weakness inconsistent. Check CT head, d/w neuro.  Doppler negative.  Stable thoracic aneurysm. Neuro requests MRI.  This chart was scribed for Deborah OctaveStephen Terril Chestnut, MD by Leone PayorSonum Patel, ED Scribe. This patient was seen in room B16C/B16C and the patient's care was started 8:15 AM.   I personally performed the services described in this  documentation, which was scribed in my presence. The recorded information has been reviewed and is accurate.   Deborah OctaveStephen Zahira Brummond, MD 05/07/14 [phone]

## 2014-05-07 NOTE — Discharge Instructions (Signed)
Please follow up with your doctor and also with neurology for further evaluation of your condition.  You may need a nerve conduction study to identify the cause of your pain and weakness.    Hypotonia Hypotonia is decreased muscle tone. Muscle tone is the amount of tension or resistance to movement in a muscle while at rest. It is different from muscle strength which is how much force a muscle can apply. For example, you may be able to do sit-ups which are a measure of abdominal muscle strength but if the belly is not firm then there is low muscle tone. Many people with hypotonia also have muscle weakness. Hypotonic infants are also called "floppy infants" because the low muscle tone and decreased strength of the neck and trunk muscles make the children floppy like a rag doll. CAUSES  Hypotonia is caused by a problem in one of the following areas of the body:  Brain.  Spinal cord.  Nerves.  Junction between the nerves and muscle.  Muscle. SYMPTOMS IN FLOPPY INFANTS  Delay in motor skills and development.  Shortness of breath or fast shallow breaths.  Poor sucking ability leading to poor weight gain.  Decreased alertness.  Joints can be bent further than expected due to ligament and joint laxity.  Hypotonia may be associated with disorders that can affect intellect and learning. SYMPTOMS IN OLDER CHILDREN / ADULTS  Low muscle tone and/or weakness which may be over the entire body or affect only a specific region.  Fluctuating muscle strength.  Weight loss due to fatigue during eating.  Double vision due to weakness of the eye muscles.  Decreased energy. DIAGNOSIS  Depending upon how and at what age the patient develops hypotonia, the pattern of weakness, and physical examination, your physician may order the following tests to find out which part of the area of the body is involved :  Magnetic Resonance Imaging (MRI) of the Brain and/or Spinal Cord - a way to look at the  structure of the brain and spinal cord without radiation.  Electromyogram with Nerve Conduction Studies (EMG with NCS) - a way to evaluate the function of the nerves, junction between the nerves and muscle and muscle. This test may be painful, but it is the only way to test for many different diseases.  Based on the above tests, further testing may be done to narrow down the exact cause of why that part of the body is not working properly. Depending on the cause, the hypotonia may be a short term problem that is treatable or a life-long problem that is untreatable or something in-between. TREATMENT  Treatment will be based on the disease that caused the hypotonia first. This is accompanied by symptomatic and supportive therapy for the hypotonia. Physical therapy can improve fine and movement (gross motor) control and overall body strength. Occupational therapy focuses on fine motor skills and basic skills for life. Speech therapy focuses on speech and swallowing difficulties.  Document Released: 03/01/2002 Document Revised: 06/03/2011 Document Reviewed: 10/04/2008 [facility] Patient Information 2015 [employer] [place]. This information is not intended to replace advice given to you by your health care provider. Make sure you discuss any questions you have with your health care provider.

## 2014-06-23 NOTE — Progress Notes (Signed)
CC: leg swelling  History of Present Illness: 54 y.o. female with a history of HTN, depression/anxiety and non-ischemic cardiomyopathy who is here today for cardiac follow up.  She was admitted to the hospital 11/25-11/27/14 with a non-STEMI. Cardiac cath 02/16/2013 with no angiographic evidence of CAD, LVEF 35% with anteroapical, apical and inferoapical HK. Echocardiogram (02/17/2013): EF 30-35%, apical septal AK, apical lateral AK, apical inferior AK, apical anterior AK, AK of the true apex, grade 1 diastolic dysfunction, trivial MR, normal RVSF, PASP 50. Chest/abdominal/pelvic CTA (02/16/2013): No dissection, mild aneurysmal dilatation of the ascending thoracic aorta, no pulmonary embolism, 5 x 2.5 cm left ovarian cyst, mild patchy lower lobe airspace disease (left greater than right). Overall picture was felt to be consistent with stress-induced cardiomyopathy. She was placed on beta blocker and ACEI. Echo 05/07/13 with resolution of LV function, now normal.   She is here today for follow up. She denies chest pain, dyspnea, syncope, orthopnea, PND, edema. Only has swelling right leg felt to be due to varicose veins in primary care.   Primary Care Physician: Laurell Josephs  Past Medical History  Diagnosis Date  . Hypertension   . Depression   . Anxiety   . Dysrhythmia     "irregular" (02/17/2013)  . NSTEMI (non-ST elevated myocardial infarction) 02/16/2013    Takotsubo cardiomyopathy, normal cors  . Normocytic anemia   . Headache(784.0)     "probably once/week" (02/17/2013)  . Migraine     "probably once/week" (02/17/2013)  . 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 2D echo 02/17/13: EF 30-35%, periapical AK, normal RV size/function, moderate pulmonary HTN. c/w Takotsubo CM. ;  b.  Echo (04/2013): EF 55%, no WMA, Gr 1 DD, mildly dilated ascending aorta (39 mm), mild MR, mild BAE, PASP 33  . Left ovarian cyst 01/2013    Incidental CT finding  . Ascending aortic aneurysm 01/2013   3.5-4 cm noted on CT-A    Past Surgical History  Procedure Laterality Date  . Brachioplasty Bilateral 05/2011    Plastic Surgery to remove loose skin after Weight Loss  . Tubal ligation  1989  . Cardiac catheterization  02/17/2013    no angiographic evidence of CAD, EF 35%, HK or the anteroapical wall, apex and inferoapical wall  . Left heart catheterization with coronary angiogram N/A 02/17/2013    Procedure: LEFT HEART CATHETERIZATION WITH CORONARY ANGIOGRAM;  Surgeon: Kathleene Hazel, MD;  Location: [facility] CATH LAB;  Service: Cardiovascular;  Laterality: N/A;    Current Outpatient Prescriptions  Medication Sig Dispense Refill  . aspirin EC 81 MG tablet Take 81 mg by mouth daily.    . carvedilol (COREG) 3.125 MG tablet Take 3.125 mg by mouth 2 (two) times daily with a meal.    . ibuprofen (ADVIL,MOTRIN) 200 MG tablet Take 800 mg by mouth every 6 (six) hours as needed for moderate pain.    Marland Kitchen olmesartan (BENICAR) 20 MG tablet Take 20 mg by mouth daily.    . rosuvastatin (CRESTOR) 10 MG tablet Take 10 mg by mouth daily.    Marland Kitchen zolpidem (AMBIEN CR) 12.5 MG CR tablet Take 12.5 mg by mouth at bedtime as needed for sleep.     No current facility-administered medications for this visit.    Allergies  Allergen Reactions  . Lisinopril Other (See Comments)    Dry cough  . Vicodin [Hydrocodone-Acetaminophen] Nausea Only    History   Social History  . Marital Status: Married    Spouse  Name: N/A  . Number of Children: N/A  . Years of Education: N/A   Occupational History  . Not on file.   Social History Main Topics  . Smoking status: Never Smoker   . Smokeless tobacco: Never Used  . Alcohol Use: No  . Drug Use: No  . Sexual Activity: Yes   Other Topics Concern  . Not on file   Social History Narrative    Family History  Problem Relation Age of Onset  . Hypertension Mother   . Breast cancer Mother   . Diabetes Paternal Grandmother   . Stroke Paternal Grandmother   .  Breast cancer Maternal Aunt   . Prostate cancer Maternal Uncle   . Prostate cancer Maternal Uncle   . Depression Sister   . Bipolar disorder Daughter     Review of Systems:  As stated in the HPI and otherwise negative.   BP 162/100 mmHg  Pulse 99  Ht 5\' 6"  (1.676 m)  Wt 265 lb (120.203 kg)  BMI 42.79 kg/m2  SpO2 96%  Physical Examination: General: Well developed, well nourished, NAD HEENT: OP clear, mucus membranes moist SKIN: warm, dry. No rashes. Neuro: No focal deficits Musculoskeletal: Muscle strength 5/5 all ext Psychiatric: Mood and affect normal Neck: No JVD, no carotid bruits, no thyromegaly, no lymphadenopathy. Lungs:Clear bilaterally, no wheezes, rhonci, crackles Cardiovascular: Regular rate and rhythm. No murmurs, gallops or rubs. Abdomen:Soft. Bowel sounds present. Non-tender.  Extremities: No lower extremity edema. Pulses are 2 + in the bilateral DP/PT.  Echo 05/07/13: Left ventricle: The cavity size was normal. Wall thickness was normal. The estimated ejection fraction was 55%. Wall motion was normal; there were no regional wall motion abnormalities. Doppler parameters are consistent with abnormal left ventricular relaxation (grade 1 diastolic dysfunction). - Aortic valve: There was no stenosis. - Aorta: Ascending aortic diameter: 39mm (S). - Ascending aorta: The ascending aorta was mildly dilated. - Mitral valve: Mild regurgitation. - Left atrium: The atrium was mildly dilated. - Right ventricle: The cavity size was normal. Systolic function was normal. - Right atrium: The atrium was mildly dilated. - Tricuspid valve: Peak RV-RA gradient:6130mm Hg (S). - Pulmonary arteries: PA peak pressure: 33mm Hg (S). - Inferior vena cava: The vessel was normal in size; the respirophasic diameter changes were in the normal range (= 50%); findings are consistent with normal central venous pressure. Impressions: - Normal LV size and systolic function, EF 55%. Normal  RV size and systolic function. Mild mitral regurgitation.  EKG:  EKG is not ordered today.  Recent Labs: 05/07/2014: BUN 11; Creatinine 0.80; Hemoglobin 13.3; Platelets 274; Potassium 3.7; Sodium 139   Lipid Panel    Component Value Date/Time   CHOL 108 05/07/2013 1057   TRIG 30.0 05/07/2013 1057   HDL 42.30 05/07/2013 1057   CHOLHDL 3 05/07/2013 1057   VLDL 6.0 05/07/2013 1057   LDLCALC 60 05/07/2013 1057     Wt Readings from Last 3 Encounters:  06/24/14 265 lb (120.203 kg)  05/21/13 256 lb (116.121 kg)  03/23/13 257 lb (116.574 kg)     Other studies Reviewed: Additional studies/ records that were reviewed today include: none  Assessment and Plan:   1. Non-ischemic cardiomyopathy: Likely Takotsubo cardiomyopathy given presentation in November 2014. LVEF was noted to be 35% by echo November 2014. Repeat echo February 2015 with normal LV function. Will continue medical therapy with Cozaar and Coreg.   2. HTN: BP elevated. She did not take her meds this am.  3. Thoracic aortic aneurysm: Mild dilation of ascending thoracic aorta 4.1 cm February 2016. This is stable. Will repeat CTA chest in one year.   Current medicines are reviewed at length with the patient today.  The patient does not have concerns regarding medicines.  The following changes have been made:  no change  Labs/ tests ordered today include: None No orders of the defined types were placed in this encounter.    Disposition:   FU with me in 12 months  Signed, Verne Carrow, MD 06/24/2014 12:55 PM    [facility] [address] [place], [place], [place]  [address] Phone: [phone]; Fax: [phone]

## 2014-06-24 ENCOUNTER — Encounter: Payer: Self-pay | Admitting: Cardiovascular Disease

## 2014-06-24 ENCOUNTER — Ambulatory Visit (INDEPENDENT_AMBULATORY_CARE_PROVIDER_SITE_OTHER): Payer: Self-pay | Admitting: Cardiovascular Disease

## 2014-06-24 VITALS — BP 162/100 | HR 99 | Ht 66.0 in | Wt 265.0 lb

## 2014-06-24 DIAGNOSIS — I712 Thoracic aortic aneurysm, without rupture, unspecified: Secondary | ICD-10-CM

## 2014-06-24 DIAGNOSIS — I5181 Takotsubo syndrome: Secondary | ICD-10-CM

## 2014-06-24 DIAGNOSIS — I10 Essential (primary) hypertension: Secondary | ICD-10-CM

## 2014-06-24 NOTE — Patient Instructions (Signed)
Your physician wants you to follow-up in:  12 months.  You will receive a reminder letter in the mail two months in advance. If you don't receive a letter, please call our office to schedule the follow-up appointment.   

## 2014-10-21 ENCOUNTER — Other Ambulatory Visit: Payer: Self-pay | Admitting: Internal Medicine

## 2014-10-21 DIAGNOSIS — I8390 Asymptomatic varicose veins of unspecified lower extremity: Secondary | ICD-10-CM

## 2014-10-24 ENCOUNTER — Other Ambulatory Visit: Payer: Self-pay | Admitting: Internal Medicine

## 2014-10-24 DIAGNOSIS — A5901 Trichomonal vulvovaginitis: Secondary | ICD-10-CM

## 2014-10-24 DIAGNOSIS — I8390 Asymptomatic varicose veins of unspecified lower extremity: Secondary | ICD-10-CM

## 2014-10-24 HISTORY — DX: Trichomonal vulvovaginitis: A59.01

## 2014-11-10 ENCOUNTER — Ambulatory Visit
Admission: RE | Admit: 2014-11-10 | Discharge: 2014-11-10 | Disposition: A | Discharge status: Home / Self Care | Payer: Self-pay | Source: Ambulatory Visit | Attending: Internal Medicine | Admitting: Internal Medicine

## 2014-11-10 DIAGNOSIS — I8390 Asymptomatic varicose veins of unspecified lower extremity: Secondary | ICD-10-CM

## 2014-11-10 NOTE — Consult Note (Signed)
Chief Complaint: Patient was seen in consultation today for  Chief Complaint  Patient presents with  . Advice Only    Consult for E & M of Varicose Veins   at the request of Mulberry,Elizabeth  Referring Physician(s): Mulberry,Elizabeth  History of Present Illness: Deborah Williamson is a 54 y.o. female with a recent history of increasing leg pain as well as edema at the left ankle. Pain primarily involves the calves and feet. She also describes heaviness with difficult walking of the right lower extremity. Her symptoms are worse on the right. She denies any visible varicosities or any prior treatment for venous insufficiency. She does have compression stockings, but she has had difficulty wearing the thigh highs. She does wear the knee-high compression stockings. She has tried over-the-counter pain medications with little relief. Currently, she does use a cane for walking because of pain.  Past Medical History  Diagnosis Date  . Hypertension   . Depression   . Anxiety   . Dysrhythmia     "irregular" (02/17/2013)  . NSTEMI (non-ST elevated myocardial infarction) 02/16/2013    Takotsubo cardiomyopathy, normal cors  . Normocytic anemia   . Headache(784.0)     "probably once/week" (02/17/2013)  . Migraine     "probably once/week" (02/17/2013)  . 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 2D echo 02/17/13: EF 30-35%, periapical AK, normal RV size/function, moderate pulmonary HTN. c/w Takotsubo CM. ;  b.  Echo (04/2013): EF 55%, no WMA, Gr 1 DD, mildly dilated ascending aorta (39 mm), mild MR, mild BAE, PASP 33  . Left ovarian cyst 01/2013    Incidental CT finding  . Ascending aortic aneurysm 01/2013    3.5-4 cm noted on CT-A    Past Surgical History  Procedure Laterality Date  . Brachioplasty Bilateral 05/2011    Plastic Surgery to remove loose skin after Weight Loss  . Tubal ligation  1989  . Cardiac catheterization  02/17/2013    no angiographic evidence of CAD, EF 35%, HK or  the anteroapical wall, apex and inferoapical wall  . Left heart catheterization with coronary angiogram N/A 02/17/2013    Procedure: LEFT HEART CATHETERIZATION WITH CORONARY ANGIOGRAM;  Surgeon: Kathleene Hazel, MD;  Location: [facility] CATH LAB;  Service: Cardiovascular;  Laterality: N/A;    Allergies: Lisinopril and Vicodin  Medications: Prior to Admission medications   Medication Sig Start Date End Date Taking? Authorizing Provider  aspirin EC 81 MG tablet Take 81 mg by mouth daily.    Historical Provider, MD  carvedilol (COREG) 3.125 MG tablet Take 3.125 mg by mouth 2 (two) times daily with a meal.    Historical Provider, MD  ibuprofen (ADVIL,MOTRIN) 200 MG tablet Take 800 mg by mouth every 6 (six) hours as needed for moderate pain.    Historical Provider, MD  olmesartan (BENICAR) 20 MG tablet Take 20 mg by mouth daily.    Historical Provider, MD  rosuvastatin (CRESTOR) 10 MG tablet Take 10 mg by mouth daily.    Historical Provider, MD  zolpidem (AMBIEN CR) 12.5 MG CR tablet Take 12.5 mg by mouth at bedtime as needed for sleep.    Historical Provider, MD     Family History  Problem Relation Age of Onset  . Hypertension Mother   . Breast cancer Mother   . Diabetes Paternal Grandmother   . Stroke Paternal Grandmother   . Breast cancer Maternal Aunt   . Prostate cancer Maternal Uncle   . Prostate cancer Maternal  Uncle   . Depression Sister   . Bipolar disorder Daughter     Social History   Social History  . Marital Status: Married    Spouse Name: N/A  . Number of Children: N/A  . Years of Education: N/A   Social History Main Topics  . Smoking status: Never Smoker   . Smokeless tobacco: Never Used  . Alcohol Use: No  . Drug Use: No  . Sexual Activity: Yes   Other Topics Concern  . None   Social History Narrative    ECOG Status: 1 - Symptomatic but completely ambulatory  Review of Systems: A 12 point ROS discussed and pertinent positives are indicated in the HPI  above.  All other systems are negative.  Review of Systems  Vital Signs: BP 143/86 mmHg  Pulse 68  Temp(Src) 97.6 F (36.4 C) (Oral)  Resp 14  Ht  (1.676 m)  Wt 261 lb (118.389 kg)  BMI 42.15 kg/m2  SpO2 100%  LMP 10/10/2014 (Approximate)  Physical Exam  Constitutional: She appears well-developed and well-nourished.  Musculoskeletal:  Left lower extremity: No edema. No visible varicosities or skin breakdown. Pulses are intact distally.  Right lower extremity: No edema. No visible varicosities or skin breakdown. Pulses are intact distally.  Skin: Skin is warm and dry.  Psychiatric: She has a normal mood and affect. Her behavior is normal. Judgment and thought content normal.      Imaging:   Ultrasound today demonstrates no evidence of DVT and no evidence of venous insufficiency in the lower extremities. Varicosities cannot be identified.  Labs:  CBC:  Recent Labs  05/07/14 0827 05/07/14 0839  WBC 5.4  --   HGB 11.2* 13.3  HCT 33.9* 39.0  PLT 274  --     COAGS:  Recent Labs  05/07/14 0825  INR 0.99    BMP:  Recent Labs  05/07/14 0825 05/07/14 0839  NA 136 139  K 3.6 3.7  CL 106 106  CO2 24  --   GLUCOSE 103* 100*  BUN 8 11  CALCIUM 8.2*  --   CREATININE 0.90 0.80  GFRNONAA 72*  --   GFRAA 84*  --     LIVER FUNCTION TESTS: No results for input(s): BILITOT, AST, ALT, ALKPHOS, PROT, ALBUMIN in the last 8760 hours.  TUMOR MARKERS: No results for input(s): AFPTM, CEA, CA199, CHROMGRNA in the last 8760 hours.  Assessment and Plan:  Despite her symptoms of pain and edema at the left ankle, there is no evidence of venous insufficiency or reflux in her lower extremities. Pain at the ankles may be due to neuropathy or arthritic changes. A course of non-steroidal anti-inflammatory medications may be helpful. She was encouraged to continue wearing graded compression stockings while standing as this will help prevent venous insufficiency in the  future.  Thank you for this interesting consult.  I greatly enjoyed meeting SHANDRIA [facility]IN[facility] and look forward to participating in their care.  A copy of this report was sent to the requesting provider on this date.  Signed: Claudie Rathbone, ART A 11/10/2014, 10:20 AM   I spent a total of  20 minutes in face to face in clinical consultation, greater than 50% of which was counseling/coordinating care for lower extremity pain and venous insufficiency.

## 2014-11-29 ENCOUNTER — Other Ambulatory Visit: Payer: Self-pay | Admitting: Internal Medicine

## 2014-11-29 DIAGNOSIS — Z0000 Encounter for general adult medical examination without abnormal findings: Secondary | ICD-10-CM

## 2015-03-14 ENCOUNTER — Ambulatory Visit: Payer: Self-pay | Admitting: Internal Medicine

## 2015-04-26 ENCOUNTER — Telehealth: Payer: Self-pay | Admitting: Internal Medicine

## 2015-04-26 DIAGNOSIS — E785 Hyperlipidemia, unspecified: Secondary | ICD-10-CM

## 2015-04-26 NOTE — Telephone Encounter (Signed)
Fax refill authorization request for patient.  Prescription #: [id number], Product: Crestor 10 mg, 78 tab, take one tablet by mouth every day.

## 2015-04-27 MED ORDER — ROSUVASTATIN CALCIUM 10 MG PO TABS: 10.0000 mg | ORAL_TABLET | Freq: Every day | ORAL | Status: DC

## 2015-07-05 ENCOUNTER — Other Ambulatory Visit: Payer: Self-pay | Admitting: Internal Medicine

## 2015-07-05 MED ORDER — CARVEDILOL PHOSPHATE ER 10 MG PO CP24: 10.0000 mg | ORAL_CAPSULE | Freq: Every day | ORAL | Status: DC

## 2015-07-05 MED ORDER — OLMESARTAN MEDOXOMIL 40 MG PO TABS: 40.0000 mg | ORAL_TABLET | Freq: Every day | ORAL | Status: DC

## 2015-10-12 ENCOUNTER — Ambulatory Visit (INDEPENDENT_AMBULATORY_CARE_PROVIDER_SITE_OTHER): Payer: Medicare Other | Admitting: Internal Medicine

## 2015-10-12 ENCOUNTER — Encounter: Payer: Self-pay | Admitting: Internal Medicine

## 2015-10-12 VITALS — BP 130/90 | HR 61 | Resp 16 | Ht 66.0 in | Wt 264.0 lb

## 2015-10-12 DIAGNOSIS — I10 Essential (primary) hypertension: Secondary | ICD-10-CM

## 2015-10-12 DIAGNOSIS — M67431 Ganglion, right wrist: Secondary | ICD-10-CM

## 2015-10-12 DIAGNOSIS — Z79899 Other long term (current) drug therapy: Secondary | ICD-10-CM

## 2015-10-12 DIAGNOSIS — I5181 Takotsubo syndrome: Secondary | ICD-10-CM

## 2015-10-12 DIAGNOSIS — E785 Hyperlipidemia, unspecified: Secondary | ICD-10-CM

## 2015-10-12 DIAGNOSIS — G589 Mononeuropathy, unspecified: Secondary | ICD-10-CM

## 2015-10-12 NOTE — Progress Notes (Signed)
Subjective:    Patient ID: Deborah Williamson, female    DOB: 02/07/1961, 55 y.o.   MRN: [id number]  HPI   Here for first time since we switched to [facility] Was having difficulties leaving home with depression and is just now getting back to us after a hiatus.   Following closely with [facility]. Cannot recall name of psychiatrist.    1.  Hyperlipidemia:  Ate some fruit about 30 minutes ago.  Cholesterol panel in August was excellent with Total of 119, Trig of 56, HDL 45 and LDL of 63.  Taking Crestor daily.  2.  Essential Hypertension:  Not missing Benicar, Carvedilol. bp borderline.  Has not had bp checked elsewhere.  3.  Takotsubo Cardiomypathy:  No dyspnea, chest pain.  Does have swelling of ankles at times. No orthopnea or PND symptoms.  4.  Pain over dorsum of left had with a bump for about 3 weeks.  Not sure if bump came up suddenly or gradually over that time period.  Just noted it one day 3 weeks ago.  Drops things at times due to pain that radiates up ulnar aspect of wrist and forearm.  5.  Pain in right leg:   This is a chronic issue for which she went to PT last year.  She is performing exercises she was given and that has helped.  [facility] but has not [facility] for some time.   Describes a numbness, and possibly the pins and needles lateral and posterior lower leg.  If has been sitting and then gets up out of the seat, this seems to be more of a problem.   Current outpatient prescriptions:  .  aspirin EC 81 MG tablet, Take 81 mg by mouth daily., Disp: , Rfl:  .  Brexpiprazole (REXULTI) 1 MG TABS, Take 1 tablet by mouth daily. , Disp: , Rfl:  .  busPIRone (BUSPAR) 10 MG tablet, Take 10 mg by mouth 3 (three) times daily. , Disp: , Rfl:  .  carvedilol (COREG CR) 10 MG 24 hr capsule, Take 1 capsule (10 mg total) by mouth daily., Disp: 90 capsule, Rfl: 3 .  ibuprofen (ADVIL,MOTRIN) 200 MG tablet, Take 800 mg by mouth every 6 (six) hours as  needed for moderate pain., Disp: , Rfl:  .  olmesartan (BENICAR) 40 MG tablet, Take 1 tablet (40 mg total) by mouth daily., Disp: 90 tablet, Rfl: 3 .  rosuvastatin (CRESTOR) 10 MG tablet, Take 1 tablet (10 mg total) by mouth daily., Disp: 30 tablet, Rfl: 6 .  sertraline (ZOLOFT) 100 MG tablet, Take 200 mg by mouth daily., Disp: , Rfl:  .  topiramate (TOPAMAX) 25 MG tablet, Take 25 mg by mouth daily. At bedtime, Disp: , Rfl:    Allergies  Allergen Reactions  . Lisinopril Other (See Comments)    Dry cough  . Vicodin [Hydrocodone-Acetaminophen] Nausea Only      Review of Systems     Objective:   Physical Exam NAD Neck:  No JVD Chest:  CTA CV:  RRR with normal S1 and S2, No S3, S4 or murmur.  Radial pulses normal and equal. Abd:  S, NT, No HSM or mass, + BS Right hand:  Palpable cystic like mass at dorsal base of right hand/beginning of wrist.  No overlying erythema.  Somewhat tender to palpation.  Pain with palmar (more so) and dorsiflexion. Legs Very obese with excess soft tissue from upper leg hanging  down over sides of knee joint.  NT on palpation of calf and soft tissue about and below knee.  Noted moderate varicosities of LE bilaterally Neuro/LE:  Decreased sensation to light touch and more so pinprick lateral, medial and posterior right LE as compared to left.  Motor 5/5, DTRs 2+/5       Assessment & Plan:  1.  Essential Hypertension:  Not quite at goal, but suspect a bit of anxiety being back out of home and at clinic after a hiatus.  Continue present management for now.  2.  Takotsubo Cardiomyopathy:  No evidence of CHF at this time.  3.  Hyperlipidemia:  Controlled when last checked in August at same dose of Rosuvastatin.  Check CMP.  4.  Probably ganglion cyst of right dorsal wrist:  [facility] to [facility].  5.  Right leg pain:  Suspect with this somewhat different history--numbness and tingling/pins and needles --that she may be compression her tibial and possibly  peroneal nerves behind her knee --or even stretching them the way she is elevating her legs without support behind her knees. To get her knees bent with elevation and get pillows under her knees as well--elevate to ankle, but not to heel.   To keep a journal of her symptoms and the way she is sitting or lying and bring in next visit.  [facility]

## 2015-10-13 LAB — COMPREHENSIVE METABOLIC PANEL
A/G RATIO: 1.3 (ref 1.2–2.2)
ALBUMIN: 3.9 g/dL (ref 3.5–5.5)
ALT: 10 IU/L (ref 0–32)
AST: 13 IU/L (ref 0–40)
Alkaline Phosphatase: 65 IU/L (ref 39–117)
BILIRUBIN TOTAL: 0.4 mg/dL (ref 0.0–1.2)
BUN / CREAT RATIO: 18 (ref 9–23)
BUN: 12 mg/dL (ref 6–24)
CALCIUM: 8.9 mg/dL (ref 8.7–10.2)
CHLORIDE: 101 mmol/L (ref 96–106)
CO2: 24 mmol/L (ref 18–29)
Creatinine, Ser: 0.66 mg/dL (ref 0.57–1.00)
GFR, EST AFRICAN AMERICAN: 117 mL/min/{1.73_m2} (ref >59)
GFR, EST NON AFRICAN AMERICAN: 101 mL/min/{1.73_m2} (ref >59)
GLUCOSE: 80 mg/dL (ref 65–99)
Globulin, Total: 3.1 g/dL (ref 1.5–4.5)
POTASSIUM: 4.5 mmol/L (ref 3.5–5.2)
Sodium: 138 mmol/L (ref 134–144)
TOTAL PROTEIN: 7 g/dL (ref 6.0–8.5)

## 2015-11-14 DIAGNOSIS — F4310 Post-traumatic stress disorder, unspecified: Secondary | ICD-10-CM | POA: Diagnosis not present

## 2015-11-14 DIAGNOSIS — F3181 Bipolar II disorder: Secondary | ICD-10-CM | POA: Diagnosis not present

## 2015-11-16 ENCOUNTER — Telehealth: Payer: Self-pay | Admitting: Internal Medicine

## 2015-11-16 DIAGNOSIS — F3181 Bipolar II disorder: Secondary | ICD-10-CM | POA: Diagnosis not present

## 2015-11-16 NOTE — Telephone Encounter (Signed)
Patient called to get an update on prior approval for her medications and referral for hand surgery. Patient states prior approval has been faxed twice and pharmacy has not received a response.

## 2015-11-17 NOTE — Telephone Encounter (Signed)
Patient agreed to a new medication that would be covered. The covered alternatives for the Benicar are Losartan 100mg , Valsartan 160mg , Candesartan 16 or Irbesartan 150mg . The Coreg alternative is carvedilol or labetolol 100.

## 2015-11-20 MED ORDER — LOSARTAN POTASSIUM 100 MG PO TABS: 100.0000 mg | ORAL_TABLET | Freq: Every day | ORAL | 3 refills | Status: DC

## 2015-11-20 MED ORDER — CARVEDILOL 6.25 MG PO TABS
6.2500 mg | ORAL_TABLET | Freq: Two times a day (BID) | ORAL | 3 refills | Status: DC
Start: 2015-11-20 — End: 2016-01-22

## 2015-11-20 NOTE — Telephone Encounter (Signed)
Losartan 100mg  and carvedilol 6.25 BID sent to [facility] in [place].

## 2015-11-21 DIAGNOSIS — F4310 Post-traumatic stress disorder, unspecified: Secondary | ICD-10-CM | POA: Diagnosis not present

## 2015-11-21 DIAGNOSIS — F3181 Bipolar II disorder: Secondary | ICD-10-CM | POA: Diagnosis not present

## 2015-12-07 NOTE — Telephone Encounter (Signed)
Patient needs to contact the pharmacy. VM left explaining this.

## 2015-12-19 ENCOUNTER — Encounter (HOSPITAL_COMMUNITY): Payer: Self-pay | Admitting: Psychiatry

## 2015-12-19 ENCOUNTER — Other Ambulatory Visit (HOSPITAL_COMMUNITY): Payer: Medicare Other | Attending: Psychiatry | Admitting: Psychiatry

## 2015-12-19 DIAGNOSIS — F332 Major depressive disorder, recurrent severe without psychotic features: Secondary | ICD-10-CM | POA: Diagnosis not present
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
## 2015-12-21 ENCOUNTER — Telehealth: Payer: Self-pay | Admitting: Internal Medicine

## 2015-12-21 DIAGNOSIS — F3181 Bipolar II disorder: Secondary | ICD-10-CM | POA: Diagnosis not present

## 2015-12-21 NOTE — Telephone Encounter (Signed)
Patient called to inquire about hand surgery referral. Please advise

## 2016-01-11 ENCOUNTER — Other Ambulatory Visit (HOSPITAL_COMMUNITY): Payer: Medicare Other | Attending: Psychiatry | Admitting: Psychiatry

## 2016-01-11 DIAGNOSIS — F332 Major depressive disorder, recurrent severe without psychotic features: Secondary | ICD-10-CM | POA: Diagnosis not present

## 2016-01-11 DIAGNOSIS — F329 Major depressive disorder, single episode, unspecified: Secondary | ICD-10-CM | POA: Insufficient documentation

## 2016-01-11 NOTE — Progress Notes (Signed)
Patient ID: Deborah HeadlandMarion D Williamson, female   DOB: 1960/06/05, 55 y.o.   MRN: [id number] Reported for TMS mapping.  Tolerated the procedure without problem.  Coordinates were SOA 32.  A/P 11.5.  Coil angle 5 degrees.  SMT 1.14.  The first treatment session followed

## 2016-01-11 NOTE — Progress Notes (Signed)
Pt reported to [facility] for cortical mapping and motor threshold determination for Repetitive Transcranial Magnetic Stimulation treatment for Major Depressive Disorder. Pt completed a PHQ-9 with a score of 24 ( severe depression). Pt also completed a Beck's Depression Inventory with a score of 46 (severe depression). Prior to procedure, pt signed an informed consent agreement for TMS treatment. Pt's treatment area was found by applying single pulses to her left motor cortex, hunting along the anterior/posterior plane and along the superior oblique angle until the best motor response was elicited from the pt's right thumb. The best response was observed at 6.5 A/P and 32degrees SOA, with a coil angle of 0degrees. Pt's motor threshold was calculated using the [employer]'s proprietary MT Assist algorithm, which produced a calculated motor threshold of 1.49SMT. Per these findings, pt's treatment parameters are as follows: A/P -- 11.5cm, SOA -- 32degrees, Coil Angle -- 0 degrees, Motor Threshold -- 1.14SMT. With these parameters, the pt will receive 36 sessions of TMS according to the following protocol: 3000 pulses per session, with stimulation in bursts of pulses lasting 4 seconds at a frequency of 10 Hz, separated by 26 seconds of rest. After determining pt's tx parameters, coil was moved to the treatment location, and the first burst of pulses was applied at a reduced power of 80%MT. Pt reported no complaints, and stated that the stimulation was tolerable, so the first tx session was given to the pt. Stimulation power was gradually titrated up from 80%MT to 120%MT. Pt tolerated tx well. Pt and wife left with no complaints post-tx. Pt departed from clinic without issue.

## 2016-01-12 ENCOUNTER — Ambulatory Visit (INDEPENDENT_AMBULATORY_CARE_PROVIDER_SITE_OTHER): Payer: Medicare Other | Admitting: Internal Medicine

## 2016-01-12 ENCOUNTER — Encounter: Payer: Self-pay | Admitting: Internal Medicine

## 2016-01-12 VITALS — BP 136/84 | HR 72 | Ht 67.0 in | Wt 252.0 lb

## 2016-01-12 DIAGNOSIS — D692 Other nonthrombocytopenic purpura: Secondary | ICD-10-CM

## 2016-01-12 DIAGNOSIS — R233 Spontaneous ecchymoses: Secondary | ICD-10-CM

## 2016-01-12 DIAGNOSIS — R208 Other disturbances of skin sensation: Secondary | ICD-10-CM

## 2016-01-12 DIAGNOSIS — R238 Other skin changes: Secondary | ICD-10-CM | POA: Diagnosis not present

## 2016-01-12 NOTE — Patient Instructions (Signed)
Call if you have not heard about an appt in the next couple of weeks with Neurology

## 2016-01-12 NOTE — Progress Notes (Signed)
Subjective:    Patient ID: CATHA ONTKO, female    DOB: 1961-02-03, 55 y.o.   MRN: [id number]  HPI    1.  Right hand swelling, possible Ganglion:  Hand surgery appt. Still not set up.  Sent referral to [facility] earlier this morning.  States hand the same.    2.  Right Leg Pain:  Describes a pins and needles and numbness on anterior right knee and posterior knee sometimes radiating to her ankle  involving lower posterior leg as well.   Improved with elevation of knee with pillow under neath.  History is very difficult to understand, but sounds like at this point, walking in general, makes the discomfort worse and lying or sitting down will improve. Denies any low back, buttock discomfort or numbness, weakness of tingling in upper leg area  Current Meds  Medication Sig  . aspirin EC 81 MG tablet Take 81 mg by mouth daily.  . Brexpiprazole (REXULTI) 1 MG TABS Take 2 tablets by mouth daily.   . busPIRone (BUSPAR) 10 MG tablet Take 10 mg by mouth 3 (three) times daily.   . carvedilol (COREG) 6.25 MG tablet Take 1 tablet (6.25 mg total) by mouth 2 (two) times daily with a meal.  . ibuprofen (ADVIL,MOTRIN) 200 MG tablet Take 800 mg by mouth every 6 (six) hours as needed for moderate pain.  Marland Kitchen losartan (COZAAR) 100 MG tablet Take 1 tablet (100 mg total) by mouth daily.  . rosuvastatin (CRESTOR) 10 MG tablet Take 1 tablet (10 mg total) by mouth daily.  . sertraline (ZOLOFT) 100 MG tablet Take 200 mg by mouth daily.  Marland Kitchen topiramate (TOPAMAX) 25 MG tablet Take 25 mg by mouth daily. At bedtime  . traZODone (DESYREL) 150 MG tablet Take 150 mg by mouth at bedtime. Take 1 and half tablets by mouth at bedtime    Allergies  Allergen Reactions  . Lisinopril Other (See Comments)    Dry cough  . Vicodin [Hydrocodone-Acetaminophen] Nausea Only      Review of Systems     Objective:   Physical Exam Morbidly obese Lungs:  CTA CV:  RRR Back:  NT over spinous processes.  NT over  paraspinous musculature, including LS area. Neuro:  Motor 5/5 in LE, except with 4+/5 with extension and flexion of knee.  DTRs 2+/4 throughout.  Loss of light touch sensation at low posterior ankle, though can feel over entire foot.    Sensation:  Has a loss of light touch and pinprick circumferentially at high mid thigh.  Around knee anteriorly and posteriorly, describes hyperesthesia to light touch and pinprick--sets off increased symptoms of pins and needles from "within"  Per patient.   Has loss of both light touch and pinprick basically over similar area to mid pretibial area anteriorly and low ankle posteriorly.  Foot with normal Sensation to both light touch and pinprick dorsally and plantar aspect.        Assessment & Plan:  1.  Right Hand swelling, possible ganglion cyst.  Apologized for delay in referral.  Have reinitiated again.  2.  Right leg dysesthesia:  Cannot fit her areas of sensory loss into a single lesion/area of compression or if multiple distal nerves involved.  Refer to Neurology for likely EMG/NCS to clarify. ?polyneuropathy?  Serologic work up based on EMG/NCS findings.  3.  Bruising:  Pt. Brings this up at end of interview:  Only on right arm and leg.  Not painful.  Not running  into things.  No bruising or bleeding elsewhere on body.  Check CBC, PT, PTT  4.  Depression:  Undergoing new treatment through [facility] as was not responding to medication.  Transcranial Magnetic Stimulation

## 2016-01-13 LAB — CBC WITH DIFFERENTIAL/PLATELET
BASOS ABS: 0 10*3/uL (ref 0.0–0.2)
Basos: 0 %
EOS (ABSOLUTE): 0.2 10*3/uL (ref 0.0–0.4)
Eos: 4 %
Hematocrit: 35.7 % (ref 34.0–46.6)
Hemoglobin: 11.1 g/dL (ref 11.1–15.9)
IMMATURE GRANS (ABS): 0 10*3/uL (ref 0.0–0.1)
Immature Granulocytes: 0 %
LYMPHS: 40 %
Lymphocytes Absolute: 1.8 10*3/uL (ref 0.7–3.1)
MCH: 26.1 pg — AB (ref 26.6–33.0)
MCHC: 31.1 g/dL — ABNORMAL LOW (ref 31.5–35.7)
MCV: 84 fL (ref 79–97)
MONOCYTES: 7 %
Monocytes Absolute: 0.3 10*3/uL (ref 0.1–0.9)
NEUTROS ABS: 2.2 10*3/uL (ref 1.4–7.0)
NEUTROS PCT: 49 %
PLATELETS: 272 10*3/uL (ref 150–379)
RBC: 4.26 x10E6/uL (ref 3.77–5.28)
RDW: 14.9 % (ref 12.3–15.4)
WBC: 4.6 10*3/uL (ref 3.4–10.8)

## 2016-01-13 LAB — APTT: aPTT: 26 s (ref 24–33)

## 2016-01-13 LAB — PROTIME-INR
INR: 1 (ref 0.8–1.2)
PROTHROMBIN TIME: 10.6 s (ref 9.1–12.0)

## 2016-01-15 ENCOUNTER — Other Ambulatory Visit (INDEPENDENT_AMBULATORY_CARE_PROVIDER_SITE_OTHER): Payer: Medicare Other

## 2016-01-15 DIAGNOSIS — F329 Major depressive disorder, single episode, unspecified: Secondary | ICD-10-CM | POA: Diagnosis not present

## 2016-01-15 DIAGNOSIS — F332 Major depressive disorder, recurrent severe without psychotic features: Secondary | ICD-10-CM

## 2016-01-15 NOTE — Progress Notes (Signed)
Patient ID: Linward HeadlandMarion D Williamson, female   DOB: 08-13-1960, 55 y.o.   MRN: [id number] Pt [facility] for Repetitive Transcranial Magnetic Stimulation treatment for Major Depressive Disorder. Pt presented with pleasant affect. Pt reported no change in  alcohol/substance use, caffeine consumption, sleep pattern or metal implant status since previous tx. Pt watched TV during tx. Pt stated that after the first day treatment, she experienced a slight headache but it was tolerable. The power at 120% was harder to manage but pt stated she thinks she can get used to it. Power kept at 120% for the duration of tx. Pt with no complaints post-tx. Pt left clinic with no problems or issues.

## 2016-01-16 ENCOUNTER — Other Ambulatory Visit (INDEPENDENT_AMBULATORY_CARE_PROVIDER_SITE_OTHER): Payer: Medicare Other

## 2016-01-16 DIAGNOSIS — F329 Major depressive disorder, single episode, unspecified: Secondary | ICD-10-CM | POA: Diagnosis not present

## 2016-01-16 DIAGNOSIS — F332 Major depressive disorder, recurrent severe without psychotic features: Secondary | ICD-10-CM | POA: Diagnosis not present

## 2016-01-16 NOTE — Progress Notes (Signed)
Patient ID: Deborah Williamson, female   DOB: 1960-12-03, 55 y.o.   MRN: [id number] Pt [facility] to [facility] for Repetitive Transcranial Magnetic Stimulation treatment for Major Depressive Disorder. Pt presented with pleasant affect. Pt reported no change in alcohol/substance use, caffeine consumption, sleep pattern or metal implant status since previous tx. Pt watched TV during tx.  Power kept at 120% for the duration of tx.Pt with no complaints post-tx. Pt left clinic with no problems or issues.

## 2016-01-17 ENCOUNTER — Other Ambulatory Visit (INDEPENDENT_AMBULATORY_CARE_PROVIDER_SITE_OTHER): Payer: Medicare Other

## 2016-01-17 DIAGNOSIS — F329 Major depressive disorder, single episode, unspecified: Secondary | ICD-10-CM | POA: Diagnosis not present

## 2016-01-17 DIAGNOSIS — F332 Major depressive disorder, recurrent severe without psychotic features: Secondary | ICD-10-CM

## 2016-01-17 NOTE — Progress Notes (Signed)
Patient ID: Deborah Williamson, female   DOB: 08-Aug-1960, 55 y.o.   MRN: [id number] Pt [facility] for Repetitive Transcranial Magnetic Stimulation treatment for Major Depressive Disorder. Pt presented with pleasant affect. Pt reported no change inalcohol/substance use, caffeine consumption, sleep pattern or metal implant status since previous tx. Pt watched TV during tx. Pt started feeling sensations in her hand during tx as the patient shifted and position changed. Repositioned patient and hand sensation stopped. Will consult with attending physician and continue to observe the patient during tx. Power kept at 120% for the duration of tx.Pt with no complaints post-tx. Pt left clinic with no problems or issues.

## 2016-01-18 ENCOUNTER — Other Ambulatory Visit (INDEPENDENT_AMBULATORY_CARE_PROVIDER_SITE_OTHER): Payer: Medicare Other

## 2016-01-18 ENCOUNTER — Other Ambulatory Visit: Payer: Self-pay | Admitting: Internal Medicine

## 2016-01-18 ENCOUNTER — Encounter (HOSPITAL_COMMUNITY): Payer: Self-pay

## 2016-01-18 VITALS — BP 128/78 | HR 65 | Ht 66.0 in | Wt 255.0 lb

## 2016-01-18 DIAGNOSIS — F332 Major depressive disorder, recurrent severe without psychotic features: Secondary | ICD-10-CM | POA: Diagnosis not present

## 2016-01-18 DIAGNOSIS — F329 Major depressive disorder, single episode, unspecified: Secondary | ICD-10-CM | POA: Diagnosis not present

## 2016-01-18 NOTE — Progress Notes (Signed)
Pt reported to [facility] for Repetitive Transcranial Magnetic Stimulation treatment for Major Depressive Disorder. Pt presented with appropriate affect, level mood and denied noticing any changes in depression since starting TMS sessions.  Patient reported some noted headaches bur reports she often gets these by history.  Patient denied any suicidal or homicidal ideations and no other current or change in symptoms.Patient reported no change inalcohol/substance use, caffeine consumption, sleep pattern or metal implant status since previous treatment.  Patient initially felt movement in her right hand with impulses when session started.  Worked with patient to realign coil and movements and sensation stopped but patient continued with treatment at appropriate and current threshold settings.  Patient reported near end of session some sensation still in right hand but did not want to realign coil and just wanted to finish setting.  Informed patient if this sensation continued then would have MD check to see if settings needed to be remapped.  Patient completed sessio and throughout denied pain increase or discomfort.  Patient watched TV and power remained at 120% for the duration of treatment after building up to this level with first 5 impulse series. Pt with no complaints post-treatment and left clinic with no noted or reported problems or issues. Patient to return for next session in one day.

## 2016-01-19 ENCOUNTER — Other Ambulatory Visit (INDEPENDENT_AMBULATORY_CARE_PROVIDER_SITE_OTHER): Payer: Medicare Other | Admitting: Emergency Medicine

## 2016-01-19 DIAGNOSIS — F332 Major depressive disorder, recurrent severe without psychotic features: Secondary | ICD-10-CM | POA: Diagnosis not present

## 2016-01-19 NOTE — Progress Notes (Signed)
Pt [facility] to [facility] for Repetitive Transcranial Magnetic Stimulation treatment for Major Depressive Disorder. Pt presented with pleasant affect. Pt reported no change inalcohol/substance use, caffeine consumption, sleep pattern or metal implant status since previous tx. Pt expressed concern of hand shaking when receiving pulses - tech changed coil angle settings. There was less hand movement. Pt watched TV during tx. Power kept at 120% for the duration of tx.Pt with no complaints post-tx. Pt left clinic with no problems or issues.

## 2016-01-22 ENCOUNTER — Encounter (HOSPITAL_COMMUNITY): Payer: Self-pay

## 2016-01-22 ENCOUNTER — Other Ambulatory Visit (HOSPITAL_COMMUNITY): Payer: Medicare Other

## 2016-01-22 ENCOUNTER — Telehealth: Payer: Self-pay | Admitting: Internal Medicine

## 2016-01-22 MED ORDER — CARVEDILOL 6.25 MG PO TABS: 6.2500 mg | ORAL_TABLET | Freq: Two times a day (BID) | ORAL | 11 refills | Status: DC

## 2016-01-22 NOTE — Telephone Encounter (Signed)
Patient needs Rx refills to pharmacy.  Carvedilol(COREG) 6.25 mg tablet and rosuvastatin (CRESTOR) 10 mg tablet.  Pharmacy fax #[phone]

## 2016-01-23 ENCOUNTER — Encounter (HOSPITAL_COMMUNITY): Payer: Self-pay

## 2016-01-23 NOTE — Telephone Encounter (Signed)
Pat informed patient of Dr. Renne CriglerMulberry's message.  Patient is scheduled for FLP on November 1st at 11:30 a.m. As patient had not had this done at cardiologist office.  Patient stated that she is total out of the Crestor and is very concerned about not taking this medication.  Patient would like any refills for her to stay at existing pharmacy on record.  [facility], [address], [place], [place].

## 2016-01-24 ENCOUNTER — Other Ambulatory Visit: Payer: Medicare Other | Admitting: Internal Medicine

## 2016-01-24 ENCOUNTER — Other Ambulatory Visit (HOSPITAL_COMMUNITY): Payer: Medicare Other | Attending: Psychiatry

## 2016-01-24 DIAGNOSIS — F332 Major depressive disorder, recurrent severe without psychotic features: Secondary | ICD-10-CM

## 2016-01-24 DIAGNOSIS — E782 Mixed hyperlipidemia: Secondary | ICD-10-CM

## 2016-01-24 MED ORDER — ROSUVASTATIN CALCIUM 10 MG PO TABS: 10.0000 mg | ORAL_TABLET | Freq: Every day | ORAL | 11 refills | Status: DC

## 2016-01-24 NOTE — Progress Notes (Signed)
Here for FLP 

## 2016-01-24 NOTE — Telephone Encounter (Signed)
Let her know it was sent with refills for year, but she needs to get the FLP done and let the cardiologist's office know she was out --and how many days she was out.

## 2016-01-24 NOTE — Progress Notes (Signed)
Patient ID: Deborah HeadlandMarion D Grumbine, female   DOB: 12/06/1960, 55 y.o.   MRN: [id number] Pt [facility] for Repetitive Transcranial Magnetic Stimulation treatment for Major Depressive Disorder. Pt presented with pleasant affect. Pt reported no change inalcohol/substance use, caffeine consumption, sleep pattern or metal implant status since previous tx. Attending physician changed pt motor threshold to 0.93 and Coil Angle to -5 degrees due to hand shaking. Pt experienced no affect once calculations were modified by physician as there was less hand movement. Pt watched TV during tx. Power kept at 120% for the duration of tx.Pt with no complaints post-tx. Pt left clinic with no problems or issue.

## 2016-01-25 ENCOUNTER — Other Ambulatory Visit (INDEPENDENT_AMBULATORY_CARE_PROVIDER_SITE_OTHER): Payer: Medicare Other

## 2016-01-25 DIAGNOSIS — F332 Major depressive disorder, recurrent severe without psychotic features: Secondary | ICD-10-CM | POA: Diagnosis not present

## 2016-01-25 LAB — LIPID PANEL W/O CHOL/HDL RATIO
CHOLESTEROL TOTAL: 137 mg/dL (ref 100–199)
HDL: 45 mg/dL (ref >39)
LDL Calculated: 78 mg/dL (ref 0–99)
TRIGLYCERIDES: 68 mg/dL (ref 0–149)
VLDL Cholesterol Cal: 14 mg/dL (ref 5–40)

## 2016-01-25 NOTE — Progress Notes (Signed)
Patient ID: Deborah Williamson, female   DOB: May 05, 1960, 55 y.o.   MRN: [id number] Pt reported to [facility] for Repetitive Transcranial Magnetic Stimulation treatment for Major Depressive Disorder. Pt presented with pleasant affect. Pt reported no change inalcohol/substance use, caffeine consumption, sleep pattern or metal implant status since previous tx. Pt stated that tx was successful the day before and experienced no headaches or other side effects. Pt experienced no affect once calculations were modified by physician as there was less hand movement.Pt watched TV during tx. Power kept at 120% for the duration of tx.Pt with no complaints post-tx. Pt left clinic with no problems or issue.

## 2016-01-25 NOTE — Telephone Encounter (Signed)
Appointment has been scheduled. Pt. informed

## 2016-01-26 ENCOUNTER — Other Ambulatory Visit (INDEPENDENT_AMBULATORY_CARE_PROVIDER_SITE_OTHER): Payer: Medicare Other

## 2016-01-26 DIAGNOSIS — F332 Major depressive disorder, recurrent severe without psychotic features: Secondary | ICD-10-CM | POA: Diagnosis not present

## 2016-01-26 NOTE — Progress Notes (Signed)
Patient ID: Deborah HeadlandMarion D Williamson, female   DOB: 05/06/1960, 55 y.o.   MRN: [id number] [facility] for Repetitive Transcranial Magnetic Stimulation treatment for Major Depressive Disorder. Pt presented with pleasant affect. Pt reported no change inalcohol/substance use, caffeine consumption, sleep pattern or metal implant status since previous tx.Pt watched TV during tx. Power kept at 120% for the duration of tx.Pt with no complaints post-tx. Pt left clinic with no problems or issue.

## 2016-01-29 ENCOUNTER — Other Ambulatory Visit (INDEPENDENT_AMBULATORY_CARE_PROVIDER_SITE_OTHER): Payer: Medicare Other

## 2016-01-29 DIAGNOSIS — F332 Major depressive disorder, recurrent severe without psychotic features: Secondary | ICD-10-CM

## 2016-01-29 NOTE — Progress Notes (Signed)
Patient ID: Deborah HeadlandMarion D Williamson, female   DOB: [birth year], 55 y.o.   MRN: [id number] [facility] for Repetitive Transcranial Magnetic Stimulation treatment for Major Depressive Disorder. Pt presented with pleasant affect. Pt reported no change inalcohol/substance use, caffeine consumption, sleep pattern or metal implant status since previous tx.Pt watched TV and engaged in conversation during tx.Pt stated that she went out for the first time Friday and had a slight panic attack but was proud of herself for attempting to go out. Stated that normally she would have a panic attack just thinking about going out or did not feel like going out at all. Pt stated that she feels as though TMS is working for her. Power kept at 120% for the duration of tx.Pt with no complaints post-tx. Pt left clinic with no problems or issue.

## 2016-01-30 ENCOUNTER — Encounter: Payer: Self-pay | Admitting: Neurology

## 2016-01-30 ENCOUNTER — Other Ambulatory Visit (INDEPENDENT_AMBULATORY_CARE_PROVIDER_SITE_OTHER): Payer: Medicare Other

## 2016-01-30 ENCOUNTER — Ambulatory Visit (INDEPENDENT_AMBULATORY_CARE_PROVIDER_SITE_OTHER): Payer: Medicare Other | Admitting: Neurology

## 2016-01-30 VITALS — BP 150/90 | HR 80 | Resp 20 | Ht 66.0 in | Wt 254.0 lb

## 2016-01-30 DIAGNOSIS — F332 Major depressive disorder, recurrent severe without psychotic features: Secondary | ICD-10-CM

## 2016-01-30 DIAGNOSIS — M79604 Pain in right leg: Secondary | ICD-10-CM | POA: Diagnosis not present

## 2016-01-30 NOTE — Progress Notes (Signed)
Provider:  Melvyn Novasarmen  Ashyr Williamson, M D  Referring Provider: Julieanne MansonMulberry, Elizabeth, MD Primary Care Physician:  Deborah MansonMULBERRY,ELIZABETH, MD  Chief Complaint  Patient presents with  . New Patient (Initial Visit)    leg pain/weakness. trouble walking    HPI:  Deborah Williamson is a 55 y.o. female of afro american descent , and  seen here as a referral from Dr. Julieanne MansonElizabeth Williamson for Leg pain, dysesthesias, affecting the right leg. The patient also had complained about right hand swelling and has been referred to a hand surgeon, she is diagnosed with depression and is undergoing new treatment. She has a tendency to bruise easily, and she endorsed depression.  Mrs. Deborah Williamson suffered a cardiac infarction in 2014 and was hospitalized at [facility].  She has significant adipositas which  is just affecting her lower extremities, she does not have any back pain but anterior and posterior knee pain , radiating towards the heel of the right leg. I reviewed the patient's medication: she is on several antidepressants, antianxiety medication, hypertension medication, and also on topiramate. Dr. Delrae AlfredMulberry has already ordered compression stockings, which did not bring relief.The patient has participated in water aerobics and swimming and felt better while in the water- but it had no lasting effect afterwards.  Social history:  Married,  Son [id number] , daughter 54 years old. 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  The patient does not smoke or drink, does not use caffeine.   Review of Systems: Out of a complete 14 system review, the patient complains of only the following symptoms, and all other reviewed systems are negative. Left leg hurting. Numbness, a feeling of lack of circulation.   Social History   Social History  . Marital status: Married    Spouse name: N/A  . Number of children: N/A  . Years of education: N/A   Occupational History  . Not on file.   Social History Main Topics  .  Smoking status: Never Smoker  . Smokeless tobacco: Never Used  . Alcohol use No  . Drug use: No  . Sexual activity: Yes   Other Topics Concern  . Not on file   Social History Narrative  . No narrative on file    Family History  Problem Relation Age of Onset  . Hypertension Mother   . Breast cancer Mother   . Depression Sister   . Diabetes Paternal Grandmother   . Stroke Paternal Grandmother   . Breast cancer Maternal Aunt   . Prostate cancer Maternal Uncle   . Prostate cancer Maternal Uncle     Past Medical History:  Diagnosis Date  . Anxiety   . Ascending aortic aneurysm (HCC) 01/2013   3.5-4 cm noted on CT-A  . Bipolar II disorder, most recent episode major depressive (HCC)    Followed at [facility].  Cline CrockKaren Garrepeta, counselor:  [phone]  . Dysrhythmia    "irregular" (02/17/2013)  . Headache(784.0)    "probably once/week" (02/17/2013)  . Hyperlipidemia   . Hypertension   . Insomnia   . Left ovarian cyst 01/2013   Incidental CT finding  . Migraine    "probably once/week" (02/17/2013)  . Normocytic anemia   . NSTEMI (non-ST elevated myocardial infarction) (HCC) 02/16/2013   Takotsubo cardiomyopathy, normal cors  . Obesity   . OCD (obsessive compulsive disorder)   . Plantar fasciitis of right foot   . PTSD (post-traumatic stress disorder)   . Takotsubo cardiomyopathy 01/2013   a. 2D echo  02/17/13: EF 30-35%, periapical AK, normal RV size/function, moderate pulmonary HTN. c/w Takotsubo CM. ;  b.  Echo (04/2013): EF 55%, no WMA, Gr 1 DD, mildly dilated ascending aorta (39 mm), mild MR, mild BAE, PASP 33  . Trichomonas vaginitis 10/2014    Past Surgical History:  Procedure Laterality Date  . BRACHIOPLASTY Bilateral 05/2011   Plastic Surgery to remove loose skin after Weight Loss  . CARDIAC CATHETERIZATION  02/17/2013   no angiographic evidence of CAD, EF 35%, HK or the anteroapical wall, apex and inferoapical wall  . LEFT HEART CATHETERIZATION WITH CORONARY  ANGIOGRAM N/A 02/17/2013   Procedure: LEFT HEART CATHETERIZATION WITH CORONARY ANGIOGRAM;  Surgeon: Kathleene Hazelhristopher D McAlhany, MD;  [facility];  Service: Cardiovascular;  Laterality: N/A;  . TUBAL LIGATION  1989    Current Outpatient Prescriptions  Medication Sig Dispense Refill  . aspirin EC 81 MG tablet Take 81 mg by mouth daily.    . Brexpiprazole (REXULTI) 1 MG TABS Take 2 tablets by mouth daily.     . busPIRone (BUSPAR) 10 MG tablet Take 10 mg by mouth 3 (three) times daily.     . carvedilol (COREG) 6.25 MG tablet Take 1 tablet (6.25 mg total) by mouth 2 (two) times daily with a meal. 60 tablet 11  . ibuprofen (ADVIL,MOTRIN) 200 MG tablet Take 800 mg by mouth every 6 (six) hours as needed for moderate pain.    Marland Kitchen. losartan (COZAAR) 100 MG tablet Take 1 tablet (100 mg total) by mouth daily. 90 tablet 3  . rosuvastatin (CRESTOR) 10 MG tablet Take 1 tablet (10 mg total) by mouth daily. 30 tablet 11  . sertraline (ZOLOFT) 100 MG tablet Take 200 mg by mouth daily.    Marland Kitchen. topiramate (TOPAMAX) 25 MG tablet Take 25 mg by mouth daily. At bedtime    . traZODone (DESYREL) 150 MG tablet Take 150 mg by mouth at bedtime. Take 1 and half tablets by mouth at bedtime     No current facility-administered medications for this visit.     Allergies as of 01/30/2016 - Review Complete 01/30/2016  Allergen Reaction Noted  . Lisinopril Other (See Comments) 05/21/2013  . Vicodin [hydrocodone-acetaminophen] Nausea Only 05/07/2014    Vitals: BP (!) 150/90   Pulse 80   Resp 20   Ht 5\' 6"  (1.676 m)   Wt 254 lb (115.2 kg)   LMP 12/10/2015   BMI 41.00 kg/m  Last Weight:  Wt Readings from Last 1 Encounters:  01/30/16 254 lb (115.2 kg)   Last Height:   Ht Readings from Last 1 Encounters:  01/30/16 5\' 6"  (1.676 m)    Physical exam:  General: The patient is awake, alert and appears not in acute distress. The patient is well groomed. Head: Normocephalic, atraumatic. Respiratory: Lungs are clear  to auscultation. Skin:  Without evidence of edema, or rash Trunk: BMI is 41-  Elevated. Patient walks with some  difficulties,  propulsive posture. " bend over "   Neurologic exam : The patient is awake and alert, oriented to place and time.  There is a normal attention span & concentration ability.  Mood and affect are appropriate.  Cranial nerves: Intact  smell and taste sensation. Pupils are equal and briskly reactive to light. Funduscopic exam without evidence of pallor or edema. Extraocular movements  in vertical and horizontal planes intact and without nystagmus. Visual fields by finger perimetry are intact. Hearing to finger rub intact.  Facial sensation intact to fine touch.  Facial motor strength is symmetric and tongue and uvula move midline. Tongue protrusion into either cheek is normal. Shoulder shrug is normal.   Motor exam:  Normal tone ,muscle bulk and symmetric strength in all extremities.  Palpation of the knee is painful to the patient. Very tender at the medial and lateral aspect of the patella. Palpation of the popliteal fossa revealed some not he structures within the muscle. These knots are palpable within the gastrocnemius, and within the hamstring musculature.  Sensory:  Fine touch, pinprick and vibration were normal. Only numbness today is above the posterior heel aspect of the right leg.  Coordination: Finger-to-nose maneuver normal without evidence of ataxia, dysmetria or tremor.  Gait and station: Patient walks without assistive device and is able unassisted to climb up to the exam table. She does not walk erect, bend forward-  Stance is wide based - Tandem gait fragmented. Romberg testing is negative   Deep tendon reflexes: in the upper and lower extremities are attenuated .   Assessment:  After physical and neurologic examination, review of laboratory studies, imaging, neurophysiology testing and pre-existing records, assessment is that of :   Unexplained origin  of nodular and painful areas in various muscles of the right leg. Anterior and posterior aspect. The numbness is not the primary complaint but the pain. Question is if the patient suffers from adpositas dolerosa or myofascial disorder.  She already has seen physical therapy for deep tissue release but did not find relief, the treatment also was very painful to her. I'll be happy to order nerve conduction study with EMG, but I would like to evaluate her right lower extremity by MRI with the goal of seeing possible lipomatosis deposits within the muscle or impinging on peripheral nerves. No chest pain, no back pain.   Plan:  Treatment plan and additional workup :  EMG and NCV lower extremities, compare left and right. Consider adipositas dolerosa work up with rheumatology.  RV after EMG  Deborah Novas MD 01/30/2016

## 2016-01-30 NOTE — Progress Notes (Signed)
Pt reported to [facility] for Repetitive Transcranial Magnetic Stimulation treatment for Major Depressive Disorder. Pt presented with pleasant affect. Pt reported no change inalcohol/substance use, caffeine consumption, sleep pattern or metal implant status since previous tx.Pt watched TV and engaged in conversation during tx.  Power kept at 120% for the duration of tx.Pt with no complaints post-tx. Pt left clinic with no problems or issue. 

## 2016-01-31 ENCOUNTER — Other Ambulatory Visit (INDEPENDENT_AMBULATORY_CARE_PROVIDER_SITE_OTHER): Payer: Medicare Other

## 2016-01-31 DIAGNOSIS — F332 Major depressive disorder, recurrent severe without psychotic features: Secondary | ICD-10-CM | POA: Diagnosis not present

## 2016-01-31 NOTE — Progress Notes (Signed)
Patient ID: Deborah Williamson, female   DOB: 12/10/1960, 55 y.o.   MRN: [id number] Pt reported to [facility] for Repetitive Transcranial Magnetic Stimulation treatment for Major Depressive Disorder. Pt presented with pleasant affect. Pt reported no change inalcohol/substance use, caffeine consumption, sleep pattern or metal implant status since previous tx.Pt watched TV and engaged in conversation during tx.  Power kept at 120% for the duration of tx.Pt with no complaints post-tx. Pt left clinic with no problems or issue. 

## 2016-02-01 ENCOUNTER — Other Ambulatory Visit (INDEPENDENT_AMBULATORY_CARE_PROVIDER_SITE_OTHER): Payer: Medicare Other

## 2016-02-01 DIAGNOSIS — F332 Major depressive disorder, recurrent severe without psychotic features: Secondary | ICD-10-CM | POA: Diagnosis not present

## 2016-02-01 NOTE — Progress Notes (Signed)
Patient ID: Deborah Williamson, female   DOB: 05/10/1960, 55 y.o.   MRN: [id number] Pt reported to [facility] for Repetitive Transcranial Magnetic Stimulation treatment for Major Depressive Disorder. Pt presented with pleasant affect. Pt reported no change inalcohol/substance use, caffeine consumption, sleep pattern or metal implant status since previous tx.Pt watched TV and engaged in conversation during tx.  Power kept at 120% for the duration of tx.Pt with no complaints post-tx. Pt left clinic with no problems or issue. 

## 2016-02-02 ENCOUNTER — Other Ambulatory Visit (INDEPENDENT_AMBULATORY_CARE_PROVIDER_SITE_OTHER): Payer: Medicare Other

## 2016-02-02 DIAGNOSIS — F332 Major depressive disorder, recurrent severe without psychotic features: Secondary | ICD-10-CM

## 2016-02-02 NOTE — Progress Notes (Signed)
Patient ID: Linward HeadlandMarion D Williamson, female   DOB: [birth year], 55 y.o.   MRN: [id number] Pt reported to [facility] for Repetitive Transcranial Magnetic Stimulation treatment for Major Depressive Disorder. Pt presented with pleasant affect. Pt reported no change inalcohol/substance use, caffeine consumption, sleep pattern or metal implant status since previous tx.Pt watched TV and engaged in conversation during tx. Pt completed PHQ-9, totaling 15. Power kept at 120% for the duration of tx.Pt with no complaints post-tx. Pt left clinic with no problems or issue.

## 2016-02-07 ENCOUNTER — Other Ambulatory Visit (INDEPENDENT_AMBULATORY_CARE_PROVIDER_SITE_OTHER): Payer: Medicare Other

## 2016-02-07 ENCOUNTER — Other Ambulatory Visit: Payer: Self-pay | Admitting: Orthopedic Surgery

## 2016-02-07 DIAGNOSIS — M67431 Ganglion, right wrist: Secondary | ICD-10-CM | POA: Diagnosis not present

## 2016-02-07 DIAGNOSIS — F332 Major depressive disorder, recurrent severe without psychotic features: Secondary | ICD-10-CM | POA: Diagnosis not present

## 2016-02-07 NOTE — Progress Notes (Signed)
Patient ID: Deborah HeadlandMarion D Dismuke, female   DOB: October 04, 1960, 55 y.o.   MRN: [id number] Pt [facility] for Repetitive Transcranial Magnetic Stimulation treatment for Major Depressive Disorder. Pt presented with pleasant affect. Pt reported no change inalcohol/substance use, caffeine consumption, sleep pattern or metal implant status since previous tx.Pt watched TV and engaged in conversation during tx. Pt completed PHQ-9, totaling 17. Power kept at 120% for the duration of tx.Pt with no complaints post-tx. Pt left clinic with no problems or issue.

## 2016-02-08 ENCOUNTER — Other Ambulatory Visit (INDEPENDENT_AMBULATORY_CARE_PROVIDER_SITE_OTHER): Payer: Medicare Other

## 2016-02-08 DIAGNOSIS — F332 Major depressive disorder, recurrent severe without psychotic features: Secondary | ICD-10-CM | POA: Diagnosis not present

## 2016-02-08 NOTE — Progress Notes (Signed)
Patient ID: Deborah HeadlandMarion D Williamson, female   DOB: 03-03-1961, 55 y.o.   MRN: [id number] Pt [facility] for Repetitive Transcranial Magnetic Stimulation treatment for Major Depressive Disorder. Pt presented with pleasant affect. Pt reported no change inalcohol/substance use, caffeine consumption, sleep pattern or metal implant status since previous tx.Pt watched TV and engaged in conversation during tx. Pt stated that she has not been sleeping well and feel extremely tired. Made an appointment with her physician to do a sleep study as her sleep medication is not working.Power kept at 120% for the duration of tx.Pt with no complaints post-tx. Pt left clinic with no problems or issue.

## 2016-02-09 ENCOUNTER — Other Ambulatory Visit (INDEPENDENT_AMBULATORY_CARE_PROVIDER_SITE_OTHER): Payer: Medicare Other

## 2016-02-09 DIAGNOSIS — F332 Major depressive disorder, recurrent severe without psychotic features: Secondary | ICD-10-CM | POA: Diagnosis not present

## 2016-02-09 NOTE — Progress Notes (Signed)
Patient ID: Deborah HeadlandMarion D Pedraza, female   DOB: [birth year], 55 y.o.   MRN: [id number] Pt [facility] for Repetitive Transcranial Magnetic Stimulation treatment for Major Depressive Disorder. Pt presented with pleasant affect. Pt reported no change inalcohol/substance use, caffeine consumption, sleep pattern or metal implant status since previous tx.Pt watched TV and engaged in conversation during tx.  Power kept at 120% for the duration of tx.Pt with no complaints post-tx. Pt left clinic with no problems or issue.

## 2016-02-12 ENCOUNTER — Telehealth: Payer: Self-pay

## 2016-02-12 ENCOUNTER — Other Ambulatory Visit (INDEPENDENT_AMBULATORY_CARE_PROVIDER_SITE_OTHER): Payer: Medicare Other

## 2016-02-12 ENCOUNTER — Ambulatory Visit (INDEPENDENT_AMBULATORY_CARE_PROVIDER_SITE_OTHER): Payer: Self-pay | Admitting: Neurology

## 2016-02-12 ENCOUNTER — Encounter: Payer: Self-pay | Admitting: Neurology

## 2016-02-12 ENCOUNTER — Ambulatory Visit (INDEPENDENT_AMBULATORY_CARE_PROVIDER_SITE_OTHER): Payer: Medicare Other | Admitting: Neurology

## 2016-02-12 DIAGNOSIS — M79604 Pain in right leg: Secondary | ICD-10-CM

## 2016-02-12 DIAGNOSIS — F332 Major depressive disorder, recurrent severe without psychotic features: Secondary | ICD-10-CM | POA: Diagnosis not present

## 2016-02-12 DIAGNOSIS — G8929 Other chronic pain: Secondary | ICD-10-CM

## 2016-02-12 DIAGNOSIS — R519 Headache, unspecified: Secondary | ICD-10-CM

## 2016-02-12 DIAGNOSIS — R51 Headache: Secondary | ICD-10-CM

## 2016-02-12 NOTE — Telephone Encounter (Signed)
Please ask PCP for ortho= work up. CD

## 2016-02-12 NOTE — Telephone Encounter (Signed)
Do you want to do the referral or should patient ask PCP?

## 2016-02-12 NOTE — Procedures (Signed)
     HISTORY:  Deborah Williamson is a 55 year old patient with a six-month history of right leg discomfort and numbness below the knee with some discomfort into the right ankle as well. The patient believes that there is some weakness of the right leg. She denies any low back pain or pain down the right leg. She is being evaluated for a possible neuropathy or a lumbosacral radiculopathy.  NERVE CONDUCTION STUDIES:  Nerve conduction studies were performed on both lower extremities. The distal motor latencies and motor amplitudes for the peroneal and posterior tibial nerves were within normal limits. The nerve conduction velocities for these nerves were also normal. The H reflex latencies were normal. The sensory latencies for the peroneal nerves were within normal limits.   EMG STUDIES:  EMG study was performed on the left lower extremity:  The tibialis anterior muscle reveals 2 to 4K motor units with full recruitment. No fibrillations or positive waves were seen. The peroneus tertius muscle reveals 2 to 4K motor units with full recruitment. No fibrillations or positive waves were seen. The medial gastrocnemius muscle reveals 1 to 3K motor units with full recruitment. No fibrillations or positive waves were seen. The vastus lateralis muscle reveals 2 to 4K motor units with full recruitment. No fibrillations or positive waves were seen. The iliopsoas muscle reveals 2 to 4K motor units with full recruitment. No fibrillations or positive waves were seen. The biceps femoris muscle (long head) reveals 2 to 4K motor units with full recruitment. No fibrillations or positive waves were seen. The lumbosacral paraspinal muscles were tested at 3 levels, and revealed no abnormalities of insertional activity at all 3 levels tested. There was good relaxation.   IMPRESSION:  Nerve conduction studies done on both lower extremities were within normal limits. No evidence of a peripheral neuropathy is seen. EMG  evaluation of the right lower extremity was unremarkable, without evidence of an overlying lumbosacral radiculopathy.  Deborah Williamson. Keith Dasani Thurlow MD 02/12/2016 11:27 AM  [facility] [address] [place], [place] [id number]  Phone [phone] Fax [phone]

## 2016-02-12 NOTE — Progress Notes (Signed)
Please refer to EMG and nerve conduction study procedure note. 

## 2016-02-12 NOTE — Telephone Encounter (Signed)
Lm on personal vm with results and recommendations. Left call back number for further questions.

## 2016-02-12 NOTE — Telephone Encounter (Signed)
-----   Message from Melvyn Novasarmen Dohmeier, MD sent at 02/12/2016 12:16 PM EST ----- Normal EMG and NCV- no radiculopathy found. Consider orthopedic work up for joint pain, knee discomfort.

## 2016-02-12 NOTE — Progress Notes (Signed)
Patient ID: Deborah Williamson, female   DOB: 02/27/1961, 55 y.o.   MRN: [id number] Pt reported to [facility] for Repetitive Transcranial Magnetic Stimulation treatment for Major Depressive Disorder. Pt presented with pleasant affect. Pt reported no change inalcohol/substance use, caffeine consumption, sleep pattern or metal implant status since previous tx.Pt watched TV and engaged in conversation during tx.  Power kept at 120% for the duration of tx.Pt with no complaints post-tx. Pt left clinic with no problems or issue. 

## 2016-02-13 ENCOUNTER — Other Ambulatory Visit (INDEPENDENT_AMBULATORY_CARE_PROVIDER_SITE_OTHER): Payer: Medicare Other

## 2016-02-13 DIAGNOSIS — F332 Major depressive disorder, recurrent severe without psychotic features: Secondary | ICD-10-CM

## 2016-02-13 NOTE — Progress Notes (Signed)
Pt reported to [facility] for Repetitive Transcranial Magnetic Stimulation treatment for Major Depressive Disorder. Pt presented with pleasant affect. Pt reported no change inalcohol/substance use, caffeine consumption, sleep pattern or metal implant status since previous tx.Pt watched TV and engaged in conversation during tx.  Power kept at 120% for the duration of tx.Pt with no complaints post-tx. Pt left clinic with no problems or issue. 

## 2016-02-14 ENCOUNTER — Other Ambulatory Visit (INDEPENDENT_AMBULATORY_CARE_PROVIDER_SITE_OTHER): Payer: Medicare Other

## 2016-02-14 DIAGNOSIS — F332 Major depressive disorder, recurrent severe without psychotic features: Secondary | ICD-10-CM | POA: Diagnosis not present

## 2016-02-14 NOTE — Progress Notes (Signed)
Patient ID: Deborah Williamson, female   DOB: 03/18/1961, 55 y.o.   MRN: [id number] Pt reported to [facility] for Repetitive Transcranial Magnetic Stimulation treatment for Major Depressive Disorder. Pt presented with pleasant affect. Pt reported no change inalcohol/substance use, caffeine consumption, sleep pattern or metal implant status since previous tx.Pt watched TV and engaged in conversation during tx.  Power kept at 120% for the duration of tx.Pt with no complaints post-tx. Pt left clinic with no problems or issue. 

## 2016-02-15 ENCOUNTER — Encounter (HOSPITAL_COMMUNITY): Payer: Self-pay

## 2016-02-16 ENCOUNTER — Other Ambulatory Visit (INDEPENDENT_AMBULATORY_CARE_PROVIDER_SITE_OTHER): Payer: Medicare Other | Admitting: Emergency Medicine

## 2016-02-16 DIAGNOSIS — F332 Major depressive disorder, recurrent severe without psychotic features: Secondary | ICD-10-CM | POA: Diagnosis not present

## 2016-02-16 NOTE — Progress Notes (Signed)
Pt reported to [facility] for Repetitive Transcranial Magnetic Stimulation treatment for Major Depressive Disorder. Pt presented with pleasant affect. Pt reported no change inalcohol/substance use, caffeine consumption, sleep pattern or metal implant status since previous tx.Pt watched TV and engaged in conversation during tx.  Power kept at 120% for the duration of tx.Pt with no complaints post-tx. Pt left clinic with no problems or issue. 

## 2016-02-19 ENCOUNTER — Other Ambulatory Visit (INDEPENDENT_AMBULATORY_CARE_PROVIDER_SITE_OTHER): Payer: Medicare Other | Admitting: Emergency Medicine

## 2016-02-19 DIAGNOSIS — F332 Major depressive disorder, recurrent severe without psychotic features: Secondary | ICD-10-CM

## 2016-02-19 NOTE — Progress Notes (Signed)
Pt reported to [facility] for Repetitive Transcranial Magnetic Stimulation treatment for Major Depressive Disorder. Pt presented with pleasant affect. Pt reported no change inalcohol/substance use, caffeine consumption, sleep pattern or metal implant status since previous tx.Pt watched TV and engaged in conversation during tx.  Power kept at 120% for the duration of tx.Pt with no complaints post-tx. Pt left clinic with no problems or issue.

## 2016-02-20 ENCOUNTER — Other Ambulatory Visit (INDEPENDENT_AMBULATORY_CARE_PROVIDER_SITE_OTHER): Payer: Medicare Other

## 2016-02-20 DIAGNOSIS — F332 Major depressive disorder, recurrent severe without psychotic features: Secondary | ICD-10-CM

## 2016-02-20 NOTE — Progress Notes (Signed)
Patient ID: Deborah Williamson, female   DOB: 03/29/1960, 55 y.o.   MRN: [id number] Pt reported to [facility] for Repetitive Transcranial Magnetic Stimulation treatment for Major Depressive Disorder. Pt presented with pleasant affect. Pt reported no change inalcohol/substance use, caffeine consumption, sleep pattern or metal implant status since previous tx.Pt watched TV and engaged in conversation during tx.  Power kept at 120% for the duration of tx.Pt with no complaints post-tx. Pt left clinic with no problems or issue. 

## 2016-02-21 ENCOUNTER — Other Ambulatory Visit (INDEPENDENT_AMBULATORY_CARE_PROVIDER_SITE_OTHER): Payer: Medicare Other

## 2016-02-21 DIAGNOSIS — F332 Major depressive disorder, recurrent severe without psychotic features: Secondary | ICD-10-CM | POA: Diagnosis not present

## 2016-02-21 NOTE — Progress Notes (Signed)
Patient ID: Deborah HeadlandMarion D Williamson, female   DOB: 1961/03/06, 55 y.o.   MRN: [id number] [facility] for Repetitive Transcranial Magnetic Stimulation treatment for Major Depressive Disorder. Pt presented with pleasant affect. Pt reported no change inalcohol/substance use, caffeine consumption, sleep pattern or metal implant status since previous tx.Pt watched TV and engaged in conversation during tx. Pt completed PHQ-9, totaling 22. Pt stated that she is noticing a slight different as she is willing to talk more and is more open to talk about her depression. She stated that because she is not sleeping at night, she is tired the majority of the day and has low energy. Pt stated that her physician wants her to have a sleep study to find a solution to her sleep issues. Power kept at 120% for the duration of tx.Pt with no complaints post-tx. Pt left clinic with no problems or issue

## 2016-02-22 ENCOUNTER — Other Ambulatory Visit (INDEPENDENT_AMBULATORY_CARE_PROVIDER_SITE_OTHER): Payer: Medicare Other

## 2016-02-22 DIAGNOSIS — F332 Major depressive disorder, recurrent severe without psychotic features: Secondary | ICD-10-CM | POA: Diagnosis not present

## 2016-02-22 NOTE — Progress Notes (Signed)
Patient ID: Deborah Williamson, female   DOB: 01/05/1961, 55 y.o.   MRN: [id number] [facility] Stimulation treatment for Major Depressive Disorder. Pt presented with pleasant affect. Pt reported no change inalcohol/substance use, caffeine consumption, sleep pattern or metal implant status since previous tx.Pt watched TV and engaged in conversation during tx.  Power kept at 120% for the duration of tx.Pt with no complaints post-tx. Pt left clinic with no problems or issue. 

## 2016-02-23 ENCOUNTER — Other Ambulatory Visit (HOSPITAL_COMMUNITY): Payer: Medicare Other | Attending: Psychiatry | Admitting: Emergency Medicine

## 2016-02-23 DIAGNOSIS — F332 Major depressive disorder, recurrent severe without psychotic features: Secondary | ICD-10-CM | POA: Diagnosis not present

## 2016-02-23 NOTE — Progress Notes (Signed)
Pt reported to [facility] for Repetitive Transcranial Magnetic Stimulation treatment for Major Depressive Disorder. Pt presented with pleasant affect. Pt reported no change inalcohol/substance use, caffeine consumption, sleep pattern or metal implant status since previous tx.Pt watched TV and engaged in conversation during tx.  Power kept at 120% for the duration of tx.Pt with no complaints post-tx. Pt left clinic with no problems or issue. 

## 2016-02-26 ENCOUNTER — Other Ambulatory Visit (INDEPENDENT_AMBULATORY_CARE_PROVIDER_SITE_OTHER): Payer: Medicare Other

## 2016-02-26 ENCOUNTER — Encounter (HOSPITAL_BASED_OUTPATIENT_CLINIC_OR_DEPARTMENT_OTHER): Payer: Self-pay | Admitting: *Deleted

## 2016-02-26 DIAGNOSIS — F332 Major depressive disorder, recurrent severe without psychotic features: Secondary | ICD-10-CM

## 2016-02-26 NOTE — Progress Notes (Signed)
   02/26/16 1203  OBSTRUCTIVE SLEEP APNEA  Have you ever been diagnosed with sleep apnea through a sleep study? No  Do you snore loudly (loud enough to be heard through closed doors)?  0  Do you often feel tired, fatigued, or sleepy during the daytime (such as falling asleep during driving or talking to someone)? 0  Has anyone observed you stop breathing during your sleep? 0  Do you have, or are you being treated for high blood pressure? 1  BMI more than 35 kg/m2? 1  Age > 50 (1-yes) 1  Neck circumference greater than:Female 16 inches or larger, Female 17inches or larger? 1  Female Gender (Yes=1) 0  Obstructive Sleep Apnea Score 4

## 2016-02-26 NOTE — Progress Notes (Signed)
Patient ID: Natsha D Savidge, female   DOB: 11/27/1960, 55 y.o.   MRN: [id number] Pt reported to [facility] for Repetitive Transcranial Magnetic Stimulation treatment for Major Depressive Disorder. Pt presented with pleasant affect. Pt reported no change inalcohol/substance use, caffeine consumption, sleep pattern or metal implant status since previous tx.Pt watched TV and engaged in conversation during tx.  Power kept at 120% for the duration of tx.Pt with no complaints post-tx. Pt left clinic with no problems or issue. 

## 2016-02-27 ENCOUNTER — Other Ambulatory Visit (INDEPENDENT_AMBULATORY_CARE_PROVIDER_SITE_OTHER): Payer: Medicare Other

## 2016-02-27 ENCOUNTER — Encounter (HOSPITAL_BASED_OUTPATIENT_CLINIC_OR_DEPARTMENT_OTHER)
Admission: RE | Admit: 2016-02-27 | Discharge: 2016-02-27 | Disposition: A | Discharge status: Home / Self Care | Payer: Medicare Other | Source: Ambulatory Visit | Attending: Orthopedic Surgery | Admitting: Orthopedic Surgery

## 2016-02-27 ENCOUNTER — Ambulatory Visit: Payer: Medicare Other | Admitting: Adult Health

## 2016-02-27 DIAGNOSIS — Z6841 Body Mass Index (BMI) 40.0 and over, adult: Secondary | ICD-10-CM | POA: Diagnosis not present

## 2016-02-27 DIAGNOSIS — M659 Synovitis and tenosynovitis, unspecified: Secondary | ICD-10-CM | POA: Diagnosis not present

## 2016-02-27 DIAGNOSIS — E785 Hyperlipidemia, unspecified: Secondary | ICD-10-CM | POA: Diagnosis not present

## 2016-02-27 DIAGNOSIS — F332 Major depressive disorder, recurrent severe without psychotic features: Secondary | ICD-10-CM

## 2016-02-27 DIAGNOSIS — Z7982 Long term (current) use of aspirin: Secondary | ICD-10-CM | POA: Diagnosis not present

## 2016-02-27 DIAGNOSIS — G43909 Migraine, unspecified, not intractable, without status migrainosus: Secondary | ICD-10-CM | POA: Diagnosis not present

## 2016-02-27 DIAGNOSIS — I10 Essential (primary) hypertension: Secondary | ICD-10-CM | POA: Diagnosis not present

## 2016-02-27 DIAGNOSIS — I252 Old myocardial infarction: Secondary | ICD-10-CM | POA: Diagnosis not present

## 2016-02-27 DIAGNOSIS — F419 Anxiety disorder, unspecified: Secondary | ICD-10-CM | POA: Diagnosis not present

## 2016-02-27 DIAGNOSIS — G4700 Insomnia, unspecified: Secondary | ICD-10-CM | POA: Diagnosis not present

## 2016-02-27 DIAGNOSIS — I5181 Takotsubo syndrome: Secondary | ICD-10-CM | POA: Diagnosis not present

## 2016-02-27 DIAGNOSIS — F3181 Bipolar II disorder: Secondary | ICD-10-CM | POA: Diagnosis not present

## 2016-02-27 DIAGNOSIS — Z79899 Other long term (current) drug therapy: Secondary | ICD-10-CM | POA: Diagnosis not present

## 2016-02-27 DIAGNOSIS — R2231 Localized swelling, mass and lump, right upper limb: Secondary | ICD-10-CM | POA: Diagnosis present

## 2016-02-27 NOTE — Progress Notes (Signed)
Patient ID: Deborah Williamson, female   DOB: 10/09/1960, 55 y.o.   MRN: [id number] Pt reported to [facility] for Repetitive Transcranial Magnetic Stimulation treatment for Major Depressive Disorder. Pt presented with pleasant affect. Pt reported no change inalcohol/substance use, caffeine consumption, sleep pattern or metal implant status since previous tx.Pt watched TV and engaged in conversation during tx.  Power kept at 120% for the duration of tx.Pt with no complaints post-tx. Pt left clinic with no problems or issue. 

## 2016-02-29 ENCOUNTER — Ambulatory Visit (HOSPITAL_BASED_OUTPATIENT_CLINIC_OR_DEPARTMENT_OTHER): Payer: Medicare Other | Admitting: Anesthesiology

## 2016-02-29 ENCOUNTER — Encounter (HOSPITAL_BASED_OUTPATIENT_CLINIC_OR_DEPARTMENT_OTHER)
Admission: RE | Disposition: A | Discharge status: Home / Self Care | Payer: Self-pay | Source: Ambulatory Visit | Attending: Orthopedic Surgery

## 2016-02-29 ENCOUNTER — Ambulatory Visit (HOSPITAL_BASED_OUTPATIENT_CLINIC_OR_DEPARTMENT_OTHER)
Admission: RE | Admit: 2016-02-29 | Discharge: 2016-02-29 | Disposition: A | Discharge status: Home / Self Care | Payer: Medicare Other | Source: Ambulatory Visit | Attending: Orthopedic Surgery | Admitting: Orthopedic Surgery

## 2016-02-29 ENCOUNTER — Encounter (HOSPITAL_BASED_OUTPATIENT_CLINIC_OR_DEPARTMENT_OTHER): Payer: Self-pay | Admitting: *Deleted

## 2016-02-29 DIAGNOSIS — E785 Hyperlipidemia, unspecified: Secondary | ICD-10-CM | POA: Diagnosis not present

## 2016-02-29 DIAGNOSIS — M659 Synovitis and tenosynovitis, unspecified: Secondary | ICD-10-CM | POA: Diagnosis not present

## 2016-02-29 DIAGNOSIS — G4700 Insomnia, unspecified: Secondary | ICD-10-CM | POA: Diagnosis not present

## 2016-02-29 DIAGNOSIS — Z7982 Long term (current) use of aspirin: Secondary | ICD-10-CM | POA: Insufficient documentation

## 2016-02-29 DIAGNOSIS — F3181 Bipolar II disorder: Secondary | ICD-10-CM | POA: Diagnosis not present

## 2016-02-29 DIAGNOSIS — M65841 Other synovitis and tenosynovitis, right hand: Secondary | ICD-10-CM | POA: Diagnosis not present

## 2016-02-29 DIAGNOSIS — I10 Essential (primary) hypertension: Secondary | ICD-10-CM | POA: Diagnosis not present

## 2016-02-29 DIAGNOSIS — N83202 Unspecified ovarian cyst, left side: Secondary | ICD-10-CM | POA: Diagnosis not present

## 2016-02-29 DIAGNOSIS — G43909 Migraine, unspecified, not intractable, without status migrainosus: Secondary | ICD-10-CM | POA: Diagnosis not present

## 2016-02-29 DIAGNOSIS — I712 Thoracic aortic aneurysm, without rupture: Secondary | ICD-10-CM | POA: Diagnosis not present

## 2016-02-29 DIAGNOSIS — F419 Anxiety disorder, unspecified: Secondary | ICD-10-CM | POA: Insufficient documentation

## 2016-02-29 DIAGNOSIS — Z6841 Body Mass Index (BMI) 40.0 and over, adult: Secondary | ICD-10-CM | POA: Insufficient documentation

## 2016-02-29 DIAGNOSIS — I5181 Takotsubo syndrome: Secondary | ICD-10-CM | POA: Diagnosis not present

## 2016-02-29 DIAGNOSIS — I252 Old myocardial infarction: Secondary | ICD-10-CM | POA: Diagnosis not present

## 2016-02-29 DIAGNOSIS — Z79899 Other long term (current) drug therapy: Secondary | ICD-10-CM | POA: Insufficient documentation

## 2016-02-29 DIAGNOSIS — R2231 Localized swelling, mass and lump, right upper limb: Secondary | ICD-10-CM | POA: Diagnosis not present

## 2016-02-29 HISTORY — PX: MASS EXCISION: SHX2000

## 2016-02-29 SURGERY — EXCISION MASS
Anesthesia: General | Site: Finger | Laterality: Right

## 2016-02-29 MED ORDER — LIDOCAINE 2% (20 MG/ML) 5 ML SYRINGE
INTRAMUSCULAR | Status: DC | PRN
  Administered 2016-02-29: 100 mg via INTRAVENOUS

## 2016-02-29 MED ORDER — SUCCINYLCHOLINE CHLORIDE 200 MG/10ML IV SOSY
PREFILLED_SYRINGE | INTRAVENOUS | Status: AC
  Filled 2016-02-29: qty 10

## 2016-02-29 MED ORDER — DEXAMETHASONE SODIUM PHOSPHATE 10 MG/ML IJ SOLN
INTRAMUSCULAR | Status: AC
  Filled 2016-02-29: qty 1

## 2016-02-29 MED ORDER — LACTATED RINGERS IV SOLN
INTRAVENOUS | Status: DC
  Administered 2016-02-29: 10 mL/h via INTRAVENOUS
  Administered 2016-02-29 (×2): via INTRAVENOUS

## 2016-02-29 MED ORDER — ONDANSETRON HCL 4 MG/2ML IJ SOLN
INTRAMUSCULAR | Status: DC | PRN
  Administered 2016-02-29: 4 mg via INTRAVENOUS

## 2016-02-29 MED ORDER — FENTANYL CITRATE (PF) 100 MCG/2ML IJ SOLN
INTRAMUSCULAR | Status: AC
Start: 2016-02-29 — End: 2016-02-29
  Filled 2016-02-29: qty 2

## 2016-02-29 MED ORDER — PHENYLEPHRINE 40 MCG/ML (10ML) SYRINGE FOR IV PUSH (FOR BLOOD PRESSURE SUPPORT)
PREFILLED_SYRINGE | INTRAVENOUS | Status: AC
  Filled 2016-02-29: qty 10

## 2016-02-29 MED ORDER — ONDANSETRON HCL 4 MG PO TABS: 4.0000 mg | ORAL_TABLET | Freq: Three times a day (TID) | ORAL | 0 refills | Status: DC | PRN

## 2016-02-29 MED ORDER — FENTANYL CITRATE (PF) 100 MCG/2ML IJ SOLN
INTRAMUSCULAR | Status: AC
  Filled 2016-02-29: qty 2

## 2016-02-29 MED ORDER — MIDAZOLAM HCL 2 MG/2ML IJ SOLN
INTRAMUSCULAR | Status: AC
  Filled 2016-02-29: qty 2

## 2016-02-29 MED ORDER — BUPIVACAINE HCL (PF) 0.25 % IJ SOLN
INTRAMUSCULAR | Status: DC | PRN
  Administered 2016-02-29: 9 mL

## 2016-02-29 MED ORDER — EPHEDRINE 5 MG/ML INJ
INTRAVENOUS | Status: AC
  Filled 2016-02-29: qty 10

## 2016-02-29 MED ORDER — LIDOCAINE 2% (20 MG/ML) 5 ML SYRINGE
INTRAMUSCULAR | Status: AC
  Filled 2016-02-29: qty 5

## 2016-02-29 MED ORDER — ATROPINE SULFATE 0.4 MG/ML IV SOSY
PREFILLED_SYRINGE | INTRAVENOUS | Status: AC
  Filled 2016-02-29: qty 2.5

## 2016-02-29 MED ORDER — MIDAZOLAM HCL 2 MG/2ML IJ SOLN: 1.0000 mg | INTRAMUSCULAR | Status: DC | PRN

## 2016-02-29 MED ORDER — DEXAMETHASONE SODIUM PHOSPHATE 10 MG/ML IJ SOLN
INTRAMUSCULAR | Status: DC | PRN
  Administered 2016-02-29: 10 mg via INTRAVENOUS

## 2016-02-29 MED ORDER — CEFAZOLIN SODIUM-DEXTROSE 2-4 GM/100ML-% IV SOLN
2.0000 g | INTRAVENOUS | Status: AC
  Administered 2016-02-29: 2 g via INTRAVENOUS

## 2016-02-29 MED ORDER — HYDROCODONE-ACETAMINOPHEN 5-325 MG PO TABS: ORAL_TABLET | ORAL | 0 refills | Status: DC

## 2016-02-29 MED ORDER — FENTANYL CITRATE (PF) 100 MCG/2ML IJ SOLN
25.0000 ug | INTRAMUSCULAR | Status: DC | PRN
  Administered 2016-02-29 (×2): 50 ug via INTRAVENOUS

## 2016-02-29 MED ORDER — PROMETHAZINE HCL 25 MG/ML IJ SOLN: 6.2500 mg | INTRAMUSCULAR | Status: DC | PRN

## 2016-02-29 MED ORDER — SCOPOLAMINE 1 MG/3DAYS TD PT72: 1.0000 | MEDICATED_PATCH | Freq: Once | TRANSDERMAL | Status: DC | PRN

## 2016-02-29 MED ORDER — CEFAZOLIN SODIUM-DEXTROSE 2-4 GM/100ML-% IV SOLN
INTRAVENOUS | Status: AC
  Filled 2016-02-29: qty 100

## 2016-02-29 MED ORDER — PROPOFOL 10 MG/ML IV BOLUS
INTRAVENOUS | Status: DC | PRN
  Administered 2016-02-29: 150 mg via INTRAVENOUS

## 2016-02-29 MED ORDER — ONDANSETRON HCL 4 MG/2ML IJ SOLN
INTRAMUSCULAR | Status: AC
  Filled 2016-02-29: qty 2

## 2016-02-29 MED ORDER — CHLORHEXIDINE GLUCONATE 4 % EX LIQD: 60.0000 mL | Freq: Once | CUTANEOUS | Status: DC

## 2016-02-29 MED ORDER — FENTANYL CITRATE (PF) 100 MCG/2ML IJ SOLN
50.0000 ug | INTRAMUSCULAR | Status: AC | PRN
  Administered 2016-02-29 (×2): 25 ug via INTRAVENOUS
  Administered 2016-02-29: 50 ug via INTRAVENOUS

## 2016-02-29 SURGICAL SUPPLY — 54 items
APL SKNCLS STERI-STRIP NONHPOA (GAUZE/BANDAGES/DRESSINGS)
BANDAGE ACE 3X5.8 VEL STRL LF (GAUZE/BANDAGES/DRESSINGS) IMPLANT
BANDAGE COBAN STERILE 2 (GAUZE/BANDAGES/DRESSINGS) IMPLANT
BENZOIN TINCTURE PRP APPL 2/3 (GAUZE/BANDAGES/DRESSINGS) IMPLANT
BLADE MINI RND TIP GREEN BEAV (BLADE) IMPLANT
BLADE SURG 15 STRL LF DISP TIS (BLADE) ×2 IMPLANT
BLADE SURG 15 STRL SS (BLADE) ×6
BNDG CMPR 9X4 STRL LF SNTH (GAUZE/BANDAGES/DRESSINGS)
BNDG COHESIVE 1X5 TAN STRL LF (GAUZE/BANDAGES/DRESSINGS) IMPLANT
BNDG CONFORM 2 STRL LF (GAUZE/BANDAGES/DRESSINGS) IMPLANT
BNDG ELASTIC 2X5.8 VLCR STR LF (GAUZE/BANDAGES/DRESSINGS) IMPLANT
BNDG ESMARK 4X9 LF (GAUZE/BANDAGES/DRESSINGS) IMPLANT
BNDG GAUZE 1X2.1 STRL (MISCELLANEOUS) IMPLANT
BNDG GAUZE ELAST 4 BULKY (GAUZE/BANDAGES/DRESSINGS) IMPLANT
BNDG PLASTER X FAST 3X3 WHT LF (CAST SUPPLIES) IMPLANT
BNDG PLSTR 9X3 FST ST WHT (CAST SUPPLIES)
CHLORAPREP W/TINT 26ML (MISCELLANEOUS) ×3 IMPLANT
CLOSURE WOUND 1/2 X4 (GAUZE/BANDAGES/DRESSINGS)
CORDS BIPOLAR (ELECTRODE) ×3 IMPLANT
COVER BACK TABLE 60X90IN (DRAPES) ×3 IMPLANT
COVER MAYO STAND STRL (DRAPES) ×3 IMPLANT
CUFF TOURNIQUET SINGLE 18IN (TOURNIQUET CUFF) ×3 IMPLANT
DRAPE EXTREMITY T 121X128X90 (DRAPE) ×3 IMPLANT
DRAPE SURG 17X23 STRL (DRAPES) ×3 IMPLANT
GAUZE SPONGE 4X4 12PLY STRL (GAUZE/BANDAGES/DRESSINGS) ×3 IMPLANT
GAUZE XEROFORM 1X8 LF (GAUZE/BANDAGES/DRESSINGS) ×3 IMPLANT
GLOVE BIO SURGEON STRL SZ7.5 (GLOVE) ×3 IMPLANT
GLOVE BIOGEL PI IND STRL 8 (GLOVE) ×1 IMPLANT
GLOVE BIOGEL PI INDICATOR 8 (GLOVE) ×2
GOWN STRL REUS W/ TWL LRG LVL3 (GOWN DISPOSABLE) ×1 IMPLANT
GOWN STRL REUS W/TWL LRG LVL3 (GOWN DISPOSABLE) ×3
GOWN STRL REUS W/TWL XL LVL3 (GOWN DISPOSABLE) ×3 IMPLANT
NDL HYPO 25X1 1.5 SAFETY (NEEDLE) ×1 IMPLANT
NEEDLE HYPO 25X1 1.5 SAFETY (NEEDLE) ×3 IMPLANT
NS IRRIG 1000ML POUR BTL (IV SOLUTION) ×3 IMPLANT
PACK BASIN DAY SURGERY FS (CUSTOM PROCEDURE TRAY) ×3 IMPLANT
PAD CAST 3X4 CTTN HI CHSV (CAST SUPPLIES) IMPLANT
PAD CAST 4YDX4 CTTN HI CHSV (CAST SUPPLIES) IMPLANT
PADDING CAST ABS 4INX4YD NS (CAST SUPPLIES) ×2
PADDING CAST ABS COTTON 4X4 ST (CAST SUPPLIES) ×1 IMPLANT
PADDING CAST COTTON 3X4 STRL (CAST SUPPLIES)
PADDING CAST COTTON 4X4 STRL (CAST SUPPLIES)
STOCKINETTE 4X48 STRL (DRAPES) ×3 IMPLANT
STRIP CLOSURE SKIN 1/2X4 (GAUZE/BANDAGES/DRESSINGS) IMPLANT
SUT ETHILON 3 0 PS 1 (SUTURE) IMPLANT
SUT ETHILON 4 0 PS 2 18 (SUTURE) ×3 IMPLANT
SUT ETHILON 5 0 P 3 18 (SUTURE)
SUT MNCRL AB 4-0 PS2 18 (SUTURE) ×2 IMPLANT
SUT NYLON ETHILON 5-0 P-3 1X18 (SUTURE) IMPLANT
SUT VIC AB 4-0 P2 18 (SUTURE) IMPLANT
SYR BULB 3OZ (MISCELLANEOUS) ×3 IMPLANT
SYR CONTROL 10ML LL (SYRINGE) ×3 IMPLANT
TOWEL OR 17X24 6PK STRL BLUE (TOWEL DISPOSABLE) ×6 IMPLANT
UNDERPAD 30X30 (UNDERPADS AND DIAPERS) ×3 IMPLANT

## 2016-02-29 NOTE — Anesthesia Procedure Notes (Signed)
Procedure Name: LMA Insertion Date/Time: 02/29/2016 11:18 AM Performed by: Curly ShoresRAFT, Jo-Ann Johanning W Pre-anesthesia Checklist: Patient identified, Emergency Drugs available, Suction available and Patient being monitored Patient Re-evaluated:Patient Re-evaluated prior to inductionOxygen Delivery Method: Circle system utilized Preoxygenation: Pre-oxygenation with 100% oxygen Intubation Type: IV induction Ventilation: Mask ventilation without difficulty LMA: LMA inserted LMA Size: 4.0 Number of attempts: 1 Placement Confirmation: positive ETCO2 Tube secured with: Tape Dental Injury: Teeth and Oropharynx as per pre-operative assessment

## 2016-02-29 NOTE — H&P (Signed)
Deborah Williamson is an 55 y.o. female.   Chief Complaint: right wrist mass HPI: 55 yo lhd female with mass right wrist x 6 months.  It is painful and bothersome to her.  She wishes to have it removed.  Allergies:  Allergies  Allergen Reactions  . Lisinopril Other (See Comments)    Dry cough  . Vicodin [Hydrocodone-Acetaminophen] Nausea Only    Past Medical History:  Diagnosis Date  . Anxiety   . Ascending aortic aneurysm (HCC) 01/2013   3.5-4 cm noted on CT-A  . Bipolar II disorder, most recent episode major depressive (HCC)    [facility].  Cline CrockKaren Garrepeta, counselor:  [phone]  . Dysrhythmia    "irregular" (02/17/2013)  . Headache(784.0)    "probably once/week" (02/17/2013)  . Hyperlipidemia   . Hypertension   . Insomnia   . Left ovarian cyst 01/2013   Incidental CT finding  . Migraine    "probably once/week" (02/17/2013)  . Normocytic anemia   . NSTEMI (non-ST elevated myocardial infarction) (HCC) 02/16/2013   Takotsubo cardiomyopathy, normal cors  . Obesity   . OCD (obsessive compulsive disorder)   . Plantar fasciitis of right foot   . PTSD (post-traumatic stress disorder)   . Takotsubo cardiomyopathy 01/2013   a. 2D echo 02/17/13: EF 30-35%, periapical AK, normal RV size/function, moderate pulmonary HTN. c/w Takotsubo CM. ;  b.  Echo (04/2013): EF 55%, no WMA, Gr 1 DD, mildly dilated ascending aorta (39 mm), mild MR, mild BAE, PASP 33  . Trichomonas vaginitis 10/2014    Past Surgical History:  Procedure Laterality Date  . BRACHIOPLASTY Bilateral 05/2011   Plastic Surgery to remove loose skin after Weight Loss  . CARDIAC CATHETERIZATION  02/17/2013   no angiographic evidence of CAD, EF 35%, HK or the anteroapical wall, apex and inferoapical wall  . LEFT HEART CATHETERIZATION WITH CORONARY ANGIOGRAM N/A 02/17/2013   Procedure: LEFT HEART CATHETERIZATION WITH CORONARY ANGIOGRAM;  Surgeon: Kathleene Hazelhristopher D McAlhany, MD;  [facility];  Service:  Cardiovascular;  Laterality: N/A;  . TUBAL LIGATION  1989    Family History: Family History  Problem Relation Age of Onset  . Hypertension Mother   . Breast cancer Mother   . Depression Sister   . Diabetes Paternal Grandmother   . Stroke Paternal Grandmother   . Breast cancer Maternal Aunt   . Prostate cancer Maternal Uncle   . Prostate cancer Maternal Uncle     Social History:   reports that she has never smoked. She has never used smokeless tobacco. She reports that she does not drink alcohol or use drugs.  Medications: Medications Prior to Admission  Medication Sig Dispense Refill  . aspirin EC 81 MG tablet Take 81 mg by mouth daily.    . Brexpiprazole (REXULTI) 1 MG TABS Take 2 tablets by mouth daily.     . busPIRone (BUSPAR) 10 MG tablet Take 10 mg by mouth 3 (three) times daily.     . carvedilol (COREG) 6.25 MG tablet Take 1 tablet (6.25 mg total) by mouth 2 (two) times daily with a meal. 60 tablet 11  . ibuprofen (ADVIL,MOTRIN) 200 MG tablet Take 800 mg by mouth every 6 (six) hours as needed for moderate pain.    Marland Kitchen. losartan (COZAAR) 100 MG tablet Take 1 tablet (100 mg total) by mouth daily. 90 tablet 3  . rosuvastatin (CRESTOR) 10 MG tablet Take 1 tablet (10 mg total) by mouth daily. 30 tablet 11  . sertraline (  ZOLOFT) 100 MG tablet Take 200 mg by mouth daily.    Marland Kitchen. topiramate (TOPAMAX) 25 MG tablet Take 25 mg by mouth daily. At bedtime    . traZODone (DESYREL) 150 MG tablet Take 150 mg by mouth at bedtime. Take 1 and half tablets by mouth at bedtime      No results found for this or any previous visit (from the past 48 hour(s)).  No results found.   A comprehensive review of systems was negative.  Blood pressure (!) 168/88, pulse 65, temperature 98.5 F (36.9 C), temperature source Oral, resp. rate 18, height 5\' 6"  (1.676 m), weight 114.6 kg (252 lb 9.6 oz), last menstrual period 01/30/2016, SpO2 100 %.  General appearance: alert, cooperative and appears stated  age Head: Normocephalic, without obvious abnormality, atraumatic Neck: supple, symmetrical, trachea midline Resp: clear to auscultation bilaterally Cardio: regular rate and rhythm GI: non-tender Extremities: Intact sensation and capillary refill all digits.  +epl/fpl/io.  No wounds.  Pulses: 2+ and symmetric Skin: Skin color, texture, turgor normal. No rashes or lesions Neurologic: Grossly normal Incision/Wound:none  Assessment/Plan Right wrist dorsal ganglion cyst.  She wishes to have it removed.  Non operative and operative treatment options were discussed with the patient and patient wishes to proceed with operative treatment. Risks, benefits, and alternatives of surgery were discussed and the patient agrees with the plan of care.   Hulan Szumski R 02/29/2016, 8:58 AM

## 2016-02-29 NOTE — Anesthesia Preprocedure Evaluation (Addendum)
Anesthesia Evaluation  Patient identified by MRN, date of birth, ID band Patient awake    Reviewed: Allergy & Precautions, NPO status , Patient's Chart, lab work & pertinent test results, reviewed documented beta blocker date and time   Airway Mallampati: II  TM Distance: >3 FB Neck ROM: Full    Dental  (+) Dental Advisory Given, Partial Upper, Partial Lower   Pulmonary neg pulmonary ROS,    Pulmonary exam normal breath sounds clear to auscultation       Cardiovascular hypertension, Pt. on home beta blockers and Pt. on medications + Past MI and + Peripheral Vascular Disease  Normal cardiovascular exam Rhythm:Regular Rate:Normal  Echo 05/07/13: Study Conclusions  - Left ventricle: The cavity size was normal. Wall thicknesswas normal. The estimated ejection fraction was 55%. Wallmotion was normal; there were no regional wall motionabnormalities. Doppler parameters are consistent withabnormal left ventricular relaxation (grade 1 diastolicdysfunction). - Aortic valve: There was no stenosis. - Aorta: Ascending aortic diameter: 39mm (S). - Ascending aorta: The ascending aorta was mildly dilated. - Mitral valve: Mild regurgitation. - Left atrium: The atrium was mildly dilated. - Right ventricle: The cavity size was normal. Systolic function was normal. - Right atrium: The atrium was mildly dilated. - Tricuspid valve: Peak RV-RA gradient:2430mm Hg (S). - Pulmonary arteries: PA peak pressure: 33mm Hg (S). - Inferior vena cava: The vessel was normal in size; the respirophasic diameter changes were in the normal range (=50%); findings are consistent with normal central venouspressure. Impressions:  - Normal LV size and systolic function, EF 55%. Normal RVsize and systolic function. Mild mitral regurgitation.   Neuro/Psych  Headaches, PSYCHIATRIC DISORDERS Anxiety Depression Bipolar Disorder    GI/Hepatic negative GI ROS, Neg  liver ROS,   Endo/Other  Morbid obesity  Renal/GU negative Renal ROS     Musculoskeletal negative musculoskeletal ROS (+)   Abdominal   Peds  Hematology  (+) Blood dyscrasia, anemia ,   Anesthesia Other Findings Day of surgery medications reviewed with the patient.  Reproductive/Obstetrics                            Anesthesia Physical Anesthesia Plan  ASA: III  Anesthesia Plan: General   Post-op Pain Management:    Induction: Intravenous  Airway Management Planned: LMA  Additional Equipment:   Intra-op Plan:   Post-operative Plan: Extubation in OR  Informed Consent: I have reviewed the patients History and Physical, chart, labs and discussed the procedure including the risks, benefits and alternatives for the proposed anesthesia with the patient or authorized representative who has indicated his/her understanding and acceptance.   Dental advisory given  Plan Discussed with: CRNA  Anesthesia Plan Comments: (Risks/benefits of general anesthesia discussed with patient including risk of damage to teeth, lips, gum, and tongue, nausea/vomiting, allergic reactions to medications, and the possibility of heart attack, stroke and death.  All patient questions answered.  Patient wishes to proceed.)        Anesthesia Quick Evaluation

## 2016-02-29 NOTE — Anesthesia Postprocedure Evaluation (Signed)
Anesthesia Post Note  Patient: Linward HeadlandMarion D Varelas  Procedure(s) Performed: Procedure(s) (LRB): EXCISION MASS, right wrist (Right)  Patient location during evaluation: PACU Anesthesia Type: General Level of consciousness: awake and alert Pain management: pain level controlled Vital Signs Assessment: post-procedure vital signs reviewed and stable Respiratory status: spontaneous breathing, nonlabored ventilation, respiratory function stable and patient connected to nasal cannula oxygen Cardiovascular status: blood pressure returned to baseline and stable Postop Assessment: no signs of nausea or vomiting Anesthetic complications: no    Last Vitals:  Vitals:   02/29/16 1115 02/29/16 1158  BP: (!) 161/90 (!) 163/82  Pulse: (!) 46 87  Resp: 16 18  Temp:      Last Pain:  Vitals:   02/29/16 1158  TempSrc: Oral  PainSc:                  Cecile HearingStephen Edward Bernon Arviso

## 2016-02-29 NOTE — Discharge Instructions (Addendum)

## 2016-02-29 NOTE — Brief Op Note (Signed)
02/29/2016  10:59 AM  PATIENT:  Deborah Williamson  55 y.o. female  PRE-OPERATIVE DIAGNOSIS:  Right wrist mass  POST-OPERATIVE DIAGNOSIS:  Right wrist mass  PROCEDURE:  Right wrist extensor tenosynovectomy  SURGEON:  Surgeon(s) and Role:    * Betha LoaKevin Amaka Gluth, MD - Primary  PHYSICIAN ASSISTANT:   ASSISTANTS: none   ANESTHESIA:   general  EBL:  Total I/O In: 700 [I.V.:700] Out: 1 [Blood:1]  BLOOD ADMINISTERED:none  DRAINS: none   LOCAL MEDICATIONS USED:  MARCAINE     SPECIMEN:  Source of Specimen:  right wrist  DISPOSITION OF SPECIMEN:  PATHOLOGY  COUNTS:  YES  TOURNIQUET:   Total Tourniquet Time Documented: Upper Arm (Right) - 22 minutes Total: Upper Arm (Right) - 22 minutes   DICTATION: .Other Dictation: Dictation Number [id number]  PLAN OF CARE: Discharge to home after PACU  PATIENT DISPOSITION:  PACU - hemodynamically stable.

## 2016-02-29 NOTE — Transfer of Care (Signed)
Immediate Anesthesia Transfer of Care Note  Patient: Deborah HeadlandMarion D Cinco  Procedure(s) Performed: Procedure(s) with comments: EXCISION MASS, right wrist (Right) - EXCISION MASS, right wrist  Patient Location: PACU  Anesthesia Type:General  Level of Consciousness: awake, alert , oriented and patient cooperative  Airway & Oxygen Therapy: Patient Spontanous Breathing and Patient connected to face mask oxygen  Post-op Assessment: Report given to RN, Post -op Vital signs reviewed and stable and Patient moving all extremities  Post vital signs: Reviewed and stable  Last Vitals:  Vitals:   02/29/16 0835  BP: (!) 168/88  Pulse: 65  Resp: 18  Temp: 36.9 C    Last Pain:  Vitals:   02/29/16 0835  TempSrc: Oral  PainSc: 8          Complications: No apparent anesthesia complications

## 2016-02-29 NOTE — Op Note (Signed)
[id number] 

## 2016-03-01 NOTE — Op Note (Signed)
NAME:  Ramires, [name] BioMARION             ACCOUNT NO.:  [id number]  MEDICAL RECORD NO.:  [id number]  LOCATION:                                 FACILITY:  PHYSICIAN:  Betha LoaKevin Osha Errico, MD        DATE OF BIRTH:  07/10/1960  DATE OF PROCEDURE:  02/29/2016 DATE OF DISCHARGE:                              OPERATIVE REPORT   PREOPERATIVE DIAGNOSIS:  Right wrist mass.  POSTOPERATIVE DIAGNOSIS:  Right wrist tenosynovitis.  PROCEDURE:  Right wrist extensor tenosynovectomy.  SURGEON:  Betha LoaKevin Lucky Trotta, MD.  ASSISTANT:  None.  ANESTHESIA:  General.  IV FLUIDS:  Per anesthesia flow sheet.  ESTIMATED BLOOD LOSS:  Minimal.  COMPLICATIONS:  None.  SPECIMENS:  Right wrist tenosynovium to Pathology.  TOURNIQUET TIME:  22 minutes.  DISPOSITION:  Stable to PACU.  INDICATIONS:  Ms. Lin GivensJeffries is a 55 year old female, who has had mass in the dorsum of her right wrist.  This is bothersome to her.  She wished to have it removed.  Risks, benefits, and alternative of surgery were discussed including the risk of blood loss; infection; damage to nerves, vessels, tendons, ligaments, bone; failure of surgery; need for additional surgery; complications with wound healing; continued pain; and recurrence of mass.  She voiced understanding of these risks and elected to proceed.  OPERATIVE COURSE:  After being identified preoperatively by myself, the patient and I agreed upon procedure and site of procedure.  Surgical site was marked.  The risks, benefits, and alternatives of surgery were reviewed and she wished to proceed.  Surgical consent had been signed. She was given IV Ancef as preoperative antibiotic prophylaxis.  She was transferred to the operating room and placed on the operating room table in supine position with the right upper extremity on arm board.  General anesthesia induced by Anesthesiology.  Right upper extremity was prepped and draped in normal sterile orthopedic fashion.  Surgical pause  was performed between surgeons, anesthesia, and operating staff; and all were in agreement as to the patient, procedure, and site of procedure. Tourniquet at the proximal aspect of the extremity was inflated to 250 mmHg after exsanguination of the limb with an Esmarch bandage.  A transverse incision was made over the mass in the dorsum of the wrist. This was carried into subcutaneous tissues by spreading technique. Bipolar electrocautery was used to obtain hemostasis.  The mass was identified.  It was filled with clear fluid.  It was adherent to the tendons.  This appeared to be coming from the tendons and out from the wrist joint.  It was carefully removed from the tendons.  There was noted to be an intratendinous ganglion on the index finger extensor tendon.  This was punctured and filled with clear gelatinous fluid.  The tenosynovium from the extensor tendons was removed.  It did not appear to be coming from the joint.  It was sent to Pathology for examination. The wound was copiously irrigated with sterile saline.  It was then closed with inverted interrupted 3-0 Vicryl sutures in the subcutaneous tissues and a running subcuticular 4-0 Monocryl in the skin.  This was augmented with benzoin and Steri-Strips.  The wound was injected with 9 mL  of 0.25% plain Marcaine to aid in postoperative analgesia.  It was then dressed with sterile 4x4s and ABD and wrapped with Kerlix and Ace bandage.  Tourniquet was deflated at 22 minutes.  Fingertips were pink with brisk capillary refill after deflation of tourniquet.  Operative drapes were broken down.  Patient was awoken from anesthesia safely. She was transferred back to stretcher and taken to PACU in stable condition.  I will see her back in the office in 1 week for postoperative followup.  I will give her Norco 5/325 one to two p.o. q.6 hours p.r.n. pain, dispensed #20; and Zofran 4 mg p.o. q.8 hours p.r.n. nausea, dispensed #20.     Betha LoaKevin  Ikeem Cleckler, MD     KK/MEDQ  D:  02/29/2016  T:  03/01/2016  Job:  [id number]

## 2016-03-02 ENCOUNTER — Encounter (HOSPITAL_BASED_OUTPATIENT_CLINIC_OR_DEPARTMENT_OTHER): Payer: Self-pay | Admitting: Orthopedic Surgery

## 2016-03-04 ENCOUNTER — Other Ambulatory Visit (INDEPENDENT_AMBULATORY_CARE_PROVIDER_SITE_OTHER): Payer: Medicare Other

## 2016-03-04 DIAGNOSIS — F332 Major depressive disorder, recurrent severe without psychotic features: Secondary | ICD-10-CM

## 2016-03-04 NOTE — Progress Notes (Signed)
Patient ID: Deborah Williamson, female   DOB: 09/23/1960, 55 y.o.   MRN: [id number] Pt reported to [facility] for Repetitive Transcranial Magnetic Stimulation treatment for Major Depressive Disorder. Pt presented with pleasant affect. Pt reported no change inalcohol/substance use, caffeine consumption, sleep pattern or metal implant status since previous tx.Pt watched TV and engaged in conversation during tx.  Power kept at 120% for the duration of tx.Pt with no complaints post-tx. Pt left clinic with no problems or issue. 

## 2016-03-05 ENCOUNTER — Other Ambulatory Visit (INDEPENDENT_AMBULATORY_CARE_PROVIDER_SITE_OTHER): Payer: Medicare Other

## 2016-03-05 DIAGNOSIS — F332 Major depressive disorder, recurrent severe without psychotic features: Secondary | ICD-10-CM | POA: Diagnosis not present

## 2016-03-05 NOTE — Progress Notes (Signed)
Patient ID: Deborah Williamson, female   DOB: 01/13/1961, 55 y.o.   MRN: [id number] Pt reported to [facility] for Repetitive Transcranial Magnetic Stimulation treatment for Major Depressive Disorder. Pt presented with pleasant affect. Pt reported no change inalcohol/substance use, caffeine consumption, sleep pattern or metal implant status since previous tx.Pt watched TV and engaged in conversation during tx.  Power kept at 120% for the duration of tx.Pt with no complaints post-tx. Pt left clinic with no problems or issue. 

## 2016-03-06 ENCOUNTER — Other Ambulatory Visit (INDEPENDENT_AMBULATORY_CARE_PROVIDER_SITE_OTHER): Payer: Medicare Other

## 2016-03-06 DIAGNOSIS — F332 Major depressive disorder, recurrent severe without psychotic features: Secondary | ICD-10-CM | POA: Diagnosis not present

## 2016-03-06 NOTE — Progress Notes (Signed)
Patient ID: Deborah HeadlandMarion D Trigg, female   DOB: Apr 19, 1960, 55 y.o.   MRN: [id number] Pt [facility] for Repetitive Transcranial Magnetic Stimulation treatment for Major Depressive Disorder. Pt presented with pleasant affect. Pt reported no change inalcohol/substance use, caffeine consumption, sleep pattern or metal implant status since previous tx.Pt watched TV and engaged in conversation during tx. Pt completed PHQ-9, totaling 22. Power kept at 120% for the duration of tx.Pt with no complaints post-tx. Pt left clinic with no problems or issue

## 2016-03-07 ENCOUNTER — Encounter (HOSPITAL_COMMUNITY): Payer: Self-pay

## 2016-03-11 ENCOUNTER — Encounter (HOSPITAL_COMMUNITY): Payer: Self-pay

## 2016-03-12 ENCOUNTER — Encounter (HOSPITAL_COMMUNITY): Payer: Self-pay

## 2016-03-12 DIAGNOSIS — F3181 Bipolar II disorder: Secondary | ICD-10-CM | POA: Diagnosis not present

## 2016-03-13 ENCOUNTER — Other Ambulatory Visit (INDEPENDENT_AMBULATORY_CARE_PROVIDER_SITE_OTHER): Payer: Medicare Other

## 2016-03-13 DIAGNOSIS — F332 Major depressive disorder, recurrent severe without psychotic features: Secondary | ICD-10-CM

## 2016-03-13 NOTE — Progress Notes (Signed)
Patient ID: Deborah Williamson, female   DOB: 04/17/1960, 55 y.o.   MRN: [id number] [facility] for Repetitive Transcranial Magnetic Stimulation treatment for severe episode of recurrent major depressive disorder, without psychotic features.  Patient presented with appropriate affect, level mood and denied any suicidal or homicidal ideations. Patient denies any other current symptoms and remains optimistic with continued TMS treatment. Patient reported no change in alcohol/substane use, caffeine consumption, sleep pattern or metal implant status since previous treatment.  Patient watched TV throughout session and interacted often with conversations. PHQ-9 will be provided to patient on Friday 03/15/2016 as patient did not start first tx until Wednesday 03/13/2016 for the week. Power remained at 120% for the duration of treatment after initial increase with first five impulse series.  Patient reported no complaints, pain, discomfort or problems with treatment throughout session and completed session with no noted concerns.  Patient departed post-treatment with no reported concerns.

## 2016-03-14 ENCOUNTER — Other Ambulatory Visit (INDEPENDENT_AMBULATORY_CARE_PROVIDER_SITE_OTHER): Payer: Medicare Other

## 2016-03-14 DIAGNOSIS — F332 Major depressive disorder, recurrent severe without psychotic features: Secondary | ICD-10-CM

## 2016-03-14 NOTE — Progress Notes (Signed)
Patient ID: Deborah Williamson, female   DOB: Oct 18, 1960, 55 y.o.   MRN: [id number] [facility] for Repetitive Transcranial Magnetic Stimulation treatment for severe episode of recurrent major depressive disorder, without psychotic features. Patient presented with appropriate affect, level mood and denied any suicidal or homicidal ideations. Patient denies any other current symptoms and remains optimistic with continued TMS treatment. Patient reported no change in alcohol/substane use, caffeine consumption, sleep pattern or metal implant status since previous treatment. Patient watched TV throughout session and interacted often with conversations. Power remained at 120% for the duration of treatment after initial increase with first five impulse series. Patient reported no complaints, pain, discomfort or problems with treatment throughout session and completed session with no noted concerns. Patient departed post-treatment with no reported concerns.

## 2016-03-15 ENCOUNTER — Other Ambulatory Visit (INDEPENDENT_AMBULATORY_CARE_PROVIDER_SITE_OTHER): Payer: Medicare Other

## 2016-03-15 DIAGNOSIS — F332 Major depressive disorder, recurrent severe without psychotic features: Secondary | ICD-10-CM | POA: Diagnosis not present

## 2016-03-15 NOTE — Progress Notes (Signed)
Patient ID: Deborah HeadlandMarion D Mokry, female   DOB: 09-26-1960, 55 y.o.   MRN: [id number] [facility] Repetitive Transcranial Magnetic Stimulation treatment for severe episode of recurrent major depressive disorder, without psychotic features. Patient presented with appropriate affect, level mood and denied any suicidal or homicidal ideations. Patient denies any other current symptoms and remains optimistic with continued TMS treatment. Patient reported no change in alcohol/substane use, caffeine consumption, sleep pattern or metal implant status since previous treatment. Patient watched TV throughout session and interacted often with conversations. Patient completed PHQ-9, totaling 23. Power remained at 120% for the duration of treatment. Patient reported no complaints, pain, discomfort or problems with treatment throughout session and completed session with no noted concerns. Patient departed post-treatment with no reported concerns.

## 2016-03-19 ENCOUNTER — Encounter (HOSPITAL_COMMUNITY): Payer: Self-pay

## 2016-03-20 ENCOUNTER — Other Ambulatory Visit (INDEPENDENT_AMBULATORY_CARE_PROVIDER_SITE_OTHER): Payer: Medicare Other

## 2016-03-20 DIAGNOSIS — F332 Major depressive disorder, recurrent severe without psychotic features: Secondary | ICD-10-CM | POA: Diagnosis not present

## 2016-03-20 NOTE — Progress Notes (Signed)
Patient ID: Deborah Williamson, female   DOB: March 14, 1961, 55 y.o.   MRN: [id number] [facility] for Repetitive Transcranial Magnetic Stimulation treatment for severe episode of recurrent major depressive disorder, without psychotic features. Patient presented with appropriate affect, level mood and denied any suicidal or homicidal ideations. Patient denies any other current symptoms and remains optimistic with continued TMS treatment. Patient reported no change in alcohol/substane use, caffeine consumption, sleep pattern or metal implant status since previous treatment. Patient watched TV throughout session and interacted often with conversations. Patient stated that she is more talkative and her family and therapist notices the difference. Patient believes that though her PHQ-9 scores are still high, she has still experienced minor changes with TMS tx. Power remained at 120% for the duration of treatment. Patient reported no complaints, pain, discomfort or problems with treatment throughout session and completed session with no noted concerns. Patient departed post-treatment with no reported concerns.

## 2016-03-22 ENCOUNTER — Ambulatory Visit (INDEPENDENT_AMBULATORY_CARE_PROVIDER_SITE_OTHER): Payer: Medicare Other | Admitting: Internal Medicine

## 2016-03-22 ENCOUNTER — Encounter: Payer: Self-pay | Admitting: Internal Medicine

## 2016-03-22 VITALS — HR 60 | Resp 12 | Ht 65.5 in | Wt 250.0 lb

## 2016-03-22 DIAGNOSIS — M79604 Pain in right leg: Secondary | ICD-10-CM | POA: Diagnosis not present

## 2016-03-22 DIAGNOSIS — I8390 Asymptomatic varicose veins of unspecified lower extremity: Secondary | ICD-10-CM | POA: Diagnosis not present

## 2016-03-22 NOTE — Progress Notes (Signed)
   Subjective:    Patient ID: Deborah Williamson, female    DOB: 03/07/1961, 55 y.o.   MRN: [id number]  HPI   Right leg pain:  Underwent testing with Neurology:  NCS/EMG normal.  Dr. Vickey Hugerohmeier suggested the diagnosis of adiposis dolorosa as she palpated nodules on her right lower leg that were apparently tender.  Suggest Rheumatology evaluation.  Depression:  Undergoing TMS therapy still daily.  Last day is next Tuesday.  Apparently, wait 30 days after compete course to see if beneficial per patient. Treatment initiated October 23.    Right wrist ganglion cyst:  Removal with Dr Merlyn LotKuzma 02/29/2016.  Doing okay.  Current Meds  Medication Sig  . aspirin EC 81 MG tablet Take 81 mg by mouth daily.  . Brexpiprazole (REXULTI) 1 MG TABS Take 2 tablets by mouth daily.   . busPIRone (BUSPAR) 10 MG tablet Take 10 mg by mouth 3 (three) times daily.   . carvedilol (COREG) 6.25 MG tablet Take 1 tablet (6.25 mg total) by mouth 2 (two) times daily with a meal.  . ibuprofen (ADVIL,MOTRIN) 200 MG tablet Take 800 mg by mouth every 6 (six) hours as needed for moderate pain.  Marland Kitchen. losartan (COZAAR) 100 MG tablet Take 1 tablet (100 mg total) by mouth daily.  . rosuvastatin (CRESTOR) 10 MG tablet Take 1 tablet (10 mg total) by mouth daily.  . sertraline (ZOLOFT) 100 MG tablet Take 200 mg by mouth daily.  Marland Kitchen. topiramate (TOPAMAX) 25 MG tablet Take 25 mg by mouth daily. At bedtime  . traZODone (DESYREL) 150 MG tablet Take 150 mg by mouth at bedtime. Take 1 and half tablets by mouth at bedtime   Allergies  Allergen Reactions  . Lisinopril Other (See Comments)    Dry cough  . Vicodin [Hydrocodone-Acetaminophen] Nausea Only     Review of Systems     Objective:   Physical Exam  Right leg: Prominent spider veining over at distal lateral thigh and lateral calf.  Very tender over these areas.  Does have lumpiness of adipose tissue as would expect with patient size overlying gastrocnemius area.   Trace edema of  bilateral ankles.      Assessment & Plan:  1.  Right leg pain:  Patient's leg complaint seems to be changing with time.  Prior to Neurology evaluation, she complained more so of numbness.   Not clear what is the cause of what seems again to be more pain.  Will go ahead with referral to Rheumatology as suggested by Dr. Vickey Hugerohmeier. Also to refer to interventional radiology to evaluate leg veins as where her varicosities are seem to be most tender areas once again. She did not tolerate compression stockings previously.

## 2016-04-15 ENCOUNTER — Telehealth: Payer: Self-pay | Admitting: Internal Medicine

## 2016-04-15 NOTE — Telephone Encounter (Signed)
Patient called to inquiry regarding DMV disability form completion.  Patient would like return call regarding status of completion of form.

## 2016-04-15 NOTE — Telephone Encounter (Signed)
To Dr. Mulberry for further direction 

## 2016-04-15 NOTE — Telephone Encounter (Signed)
Notify she may pick up in the morning

## 2016-06-06 DIAGNOSIS — M25531 Pain in right wrist: Secondary | ICD-10-CM | POA: Diagnosis not present

## 2016-06-06 DIAGNOSIS — F3181 Bipolar II disorder: Secondary | ICD-10-CM | POA: Diagnosis not present

## 2016-06-13 DIAGNOSIS — M25531 Pain in right wrist: Secondary | ICD-10-CM | POA: Diagnosis not present

## 2016-06-19 ENCOUNTER — Ambulatory Visit: Payer: Medicare Other | Admitting: Internal Medicine

## 2016-06-20 DIAGNOSIS — M25531 Pain in right wrist: Secondary | ICD-10-CM | POA: Diagnosis not present

## 2016-07-03 DIAGNOSIS — F3181 Bipolar II disorder: Secondary | ICD-10-CM | POA: Diagnosis not present

## 2016-07-05 DIAGNOSIS — M65841 Other synovitis and tenosynovitis, right hand: Secondary | ICD-10-CM | POA: Diagnosis not present

## 2016-07-08 DIAGNOSIS — F4310 Post-traumatic stress disorder, unspecified: Secondary | ICD-10-CM | POA: Diagnosis not present

## 2016-07-08 DIAGNOSIS — F3181 Bipolar II disorder: Secondary | ICD-10-CM | POA: Diagnosis not present

## 2016-08-30 ENCOUNTER — Ambulatory Visit (INDEPENDENT_AMBULATORY_CARE_PROVIDER_SITE_OTHER): Payer: Medicare Other | Admitting: Internal Medicine

## 2016-08-30 ENCOUNTER — Encounter: Payer: Self-pay | Admitting: Internal Medicine

## 2016-08-30 VITALS — BP 148/90 | HR 70 | Resp 12 | Ht 66.0 in | Wt 251.0 lb

## 2016-08-30 DIAGNOSIS — M25561 Pain in right knee: Secondary | ICD-10-CM

## 2016-08-30 DIAGNOSIS — I712 Thoracic aortic aneurysm, without rupture: Secondary | ICD-10-CM | POA: Diagnosis not present

## 2016-08-30 DIAGNOSIS — G8929 Other chronic pain: Secondary | ICD-10-CM | POA: Diagnosis not present

## 2016-08-30 DIAGNOSIS — M79604 Pain in right leg: Secondary | ICD-10-CM | POA: Diagnosis not present

## 2016-08-30 DIAGNOSIS — I7121 Aneurysm of the ascending aorta, without rupture: Secondary | ICD-10-CM

## 2016-08-30 DIAGNOSIS — I214 Non-ST elevation (NSTEMI) myocardial infarction: Secondary | ICD-10-CM

## 2016-08-30 DIAGNOSIS — I10 Essential (primary) hypertension: Secondary | ICD-10-CM

## 2016-08-30 DIAGNOSIS — Z79899 Other long term (current) drug therapy: Secondary | ICD-10-CM

## 2016-08-30 DIAGNOSIS — I5181 Takotsubo syndrome: Secondary | ICD-10-CM | POA: Diagnosis not present

## 2016-08-30 MED ORDER — CARVEDILOL PHOSPHATE ER 10 MG PO CP24: 10.0000 mg | ORAL_CAPSULE | Freq: Every day | ORAL | 11 refills | Status: DC

## 2016-08-30 NOTE — Progress Notes (Signed)
Subjective:    Patient ID: Deborah Williamson, female    DOB: 1960-12-27, 56 y.o.   MRN: [id number]  HPI   1.  Right leg pain:  For some reason, did not get there referral to Rheumatology.  We had referred her to Gavin Pound, M.D. Back in December.  She states she called back once or twice when she did not hear anything back, but never received reply.  Unable to find documentation of that in chart. Looking into what happened with referral. She then was busy with treatment of extensor tenosynovitis of right hand and just let the pain in leg go.     2.  Depression:  Underwent Repetitive Transcranial Magnetic Stimulation treatment, last treatment 12.27/2017.  Does feel happier.   Has an appt in June with Rollene Fare at [facility] therapist.   Has met her once in April.    3.  Right hand extensor tenosynovitis:  Surgical repair with Dr. Fredna Dow in December.  Has had to have PT subsequently.  4.  Essential Hypertension:  Did not take Carvedilol or Losartan this morning.  Shares she started taking both of her doses of short acting Carvedilol in the morning as she was forgetting the afternoon dose. Does not appear the long acting Carvedilol is covered.  5.  History of Takotsubo cardiomyopathy and NSTEMI:  No chest pain or dyspnea.  Has swelling of ankles at times.   Cholesterol panel at goal in November.  6.  History of dilated ascending aorta:  Last echo from what I can see was 05/07/2013.  CT angiogram of chest in 05/07/2014 which showed diamter of 41 mm, up 1 mm from previous. She is seen by cardiology yearly, though does not appear she followed up last year.   Current Meds  Medication Sig  . aspirin EC 81 MG tablet Take 81 mg by mouth daily.  . Brexpiprazole (REXULTI) 1 MG TABS Take 2 mg by mouth daily.   . busPIRone (BUSPAR) 15 MG tablet Take 15 mg by mouth 3 (three) times daily.  . carvedilol (COREG) 6.25 MG tablet Take 1 tablet (6.25 mg total) by mouth 2 (two) times daily with a meal.  .  ibuprofen (ADVIL,MOTRIN) 200 MG tablet Take 800 mg by mouth every 6 (six) hours as needed for moderate pain.  Marland Kitchen losartan (COZAAR) 100 MG tablet Take 1 tablet (100 mg total) by mouth daily.  . rosuvastatin (CRESTOR) 10 MG tablet Take 1 tablet (10 mg total) by mouth daily.  . sertraline (ZOLOFT) 100 MG tablet Take 200 mg by mouth daily.  Marland Kitchen topiramate (TOPAMAX) 25 MG tablet Take 25 mg by mouth daily. At bedtime  . traZODone (DESYREL) 100 MG tablet Take 100 mg by mouth. 3 at bedtime   Allergies  Allergen Reactions  . Lisinopril Other (See Comments)    Dry cough  . Vicodin [Hydrocodone-Acetaminophen] Nausea Only       Review of Systems     Objective:   Physical Exam  NAD Lungs:  CTA CV:  RRR with normal S1 and S2, no S3, S4, but grade 2/6 SEM LSB radiating to right second interspace LE:  Mildly decreased flexion ROM.  Full extension.  No obvious effusion.  Difficult to tell whether truly with joint line tenderness as tender all about knee with very light palpation.  Seems most tender still over varicosities prominent in medial upper portion of lower leg, just below knee level.  Pain with anterior drawer and with stress of collateral ligaments, but  seems to be pain from soft tissue in other areas about knee, not the ligaments actually attempting to stress. Walks with limp favoring right leg.        Assessment & Plan:  1.  Right leg pain:  Has not been contacted by Rheumatology, Dr. Trudie Reed. Not clear why referral did not go through.   Did not obtain referral with Dr. Estanislado Pandy of Interventional Radiology as well for evaluation of varicosities of leg as cause of pain. Will get the rheumatology appt. First and if no findings, send for interventional radiology eval of varicosities Patient to call back next week if she does not hear anything. Appears her knee may be more involved at this point--will send for xray of knee.  2.  Depression:  As per [facility].  Patient feels much better  controlled.  3.  Essential Hypertension:  Discussed in future to call if plans to take a medication in a different way than prescribed.  Will switch to long acting Carvedilol and see if covered by secondary insurance, though does not appear so.  If not, patient will get better at reminders to take evening dose of short acting separately. Ultimately, found out long acting not covered and patient will need to remain on short acting Carvidilol  4.  Ascending Aorta Dilatation:    Echoes in past show normal tricuspid aortic valve and normal aortic root.  Patient will call Dr. Chipper Herb PAC's office and get scheduled with her yearly follow up.  She will need either CT or MRI angio or echo to evaluate ascending aorta yearly.  Did not follow up as recommended in 2017.  5. History of NSTEMI and cardiomyopathy:  Clinically stable, though needs to keep bp down with appropriate use of meds.

## 2016-09-03 ENCOUNTER — Ambulatory Visit
Admission: RE | Admit: 2016-09-03 | Discharge: 2016-09-03 | Disposition: A | Discharge status: Home / Self Care | Payer: Medicare Other | Source: Ambulatory Visit | Attending: Internal Medicine | Admitting: Internal Medicine

## 2016-09-03 DIAGNOSIS — M1711 Unilateral primary osteoarthritis, right knee: Secondary | ICD-10-CM | POA: Diagnosis not present

## 2016-09-03 DIAGNOSIS — F314 Bipolar disorder, current episode depressed, severe, without psychotic features: Secondary | ICD-10-CM | POA: Diagnosis not present

## 2016-09-03 DIAGNOSIS — M25561 Pain in right knee: Principal | ICD-10-CM

## 2016-09-03 DIAGNOSIS — G8929 Other chronic pain: Secondary | ICD-10-CM

## 2016-09-03 NOTE — Progress Notes (Signed)
Cardiology Office Note:    Date:  09/04/2016   ID:  Deborah Williamson, DOB [birth year], MRN [id number]  PCP:  Julieanne MansonMulberry, Elizabeth, MD  Cardiologist:  Dr. Clifton JamesMcAlhany  Referring MD: Julieanne MansonMulberry, Elizabeth, MD   Chief Complaint  Patient presents with  . Follow-up    History of Present Illness:    Deborah Williamson is a 56 y.o. female with a hx of Hypertension, depression/anxiety and nonischemic cardiomyopathy. The patient has a history of a non-ST elevated MI in 01/2013. A cardiac catheterization on 02/16/2013 showed no angiographic evidence of CAD, LVEF is 35% with anterior apical, apical and inferior wall hypokinesis. An echocardiogram on 02/17/2013 showed EF 30-35% with apical septal akinesis, apical lateral akinesis, apical inferior akinesis, apical anterior akinesis, akinesis of the true apex, grade 1 diastolic dysfunction, trivial MR, normal right ventricular systolic function, PASP 50. A CTA of the chest/abdomen/pelvis on 02/16/2013 showed no dissection, mild aneurysmal dilatation of the descending thoracic aorta, no pulmonary embolism, 5 x 2.5 cm left ovarian cyst. It was thought that the patient's presentation represented stress cardiomyopathy. She was placed on beta blocker and ACE inhibitor. An echocardiogram in 04/2013 showed resolution of LV function with EF 55% and no regional wall motion abnormalities, grade 1 diastolic dysfunction.  A subsequent CTA 05/07/14 of the abdomen and pelvis showed no acute aortic findings, including dissection. An ascending aortic aneurysm with 41 mm diameter, and recommendation for annual imaging.  She was last seen in our office by Dr. Clifton JamesMcAlhany on 06/24/2014 with complaints of swelling of the right leg that was felt to be due to varicose veins per primary care. She had venous Dopplers of the legs in 10/2014 that showed no evidence of DVT and no evidence of venous insufficiency. She is still dealing with leg pain and has been to a neurologist and had imaging of  the right knee done yesterday. She is limited in her activity due to leg pain.  She has some mild shortness of breath with walking her dog, that she does 3 times per day. This has somewhat worsened over the last couple of months and the patient attributes it to deconditioning with her leg pain. She also has occasional orthopnea and PND with hot flashes which she was attributing to hormonal changes as her last period was 2 months ago. She has no chest pain or tightness but has occ lightheadedness with rising to standing with fast heart beat. Her BP was high at her PCP office last week, but is good today.   Past Medical History:  Diagnosis Date  . Anxiety   . Ascending aortic aneurysm (HCC) 01/2013   3.5-4 cm noted on CT-A  . Bipolar II disorder, most recent episode major depressive (HCC)    [facility]  Cline CrockKaren Garrepeta, counselor:  [phone]  . Dysrhythmia    "irregular" (02/17/2013)  . Headache(784.0)    "probably once/week" (02/17/2013)  . Hyperlipidemia   . Hypertension   . Insomnia   . Left ovarian cyst 01/2013   Incidental CT finding  . Migraine    "probably once/week" (02/17/2013)  . Normocytic anemia   . NSTEMI (non-ST elevated myocardial infarction) (HCC) 02/16/2013   Takotsubo cardiomyopathy, normal cors  . Obesity   . OCD (obsessive compulsive disorder)   . Plantar fasciitis of right foot   . PTSD (post-traumatic stress disorder)   . Takotsubo cardiomyopathy 01/2013   a. 2D echo 02/17/13: EF 30-35%, periapical AK, normal RV size/function, moderate pulmonary HTN. c/w Takotsubo CM. ;  b.  Echo (04/2013): EF 55%, no WMA, Gr 1 DD, mildly dilated ascending aorta (39 mm), mild MR, mild BAE, PASP 33  . Trichomonas vaginitis 10/2014    Past Surgical History:  Procedure Laterality Date  . BRACHIOPLASTY Bilateral 05/2011   Plastic Surgery to remove loose skin after Weight Loss  . CARDIAC CATHETERIZATION  02/17/2013   no angiographic evidence of CAD, EF 35%, HK or the  anteroapical wall, apex and inferoapical wall  . LEFT HEART CATHETERIZATION WITH CORONARY ANGIOGRAM N/A 02/17/2013   Procedure: LEFT HEART CATHETERIZATION WITH CORONARY ANGIOGRAM;  Surgeon: Kathleene Hazel, MD;  Location: [facility] CATH LAB;  Service: Cardiovascular;  Laterality: N/A;  . MASS EXCISION Right 02/29/2016   Procedure: EXCISION MASS, right wrist;  Surgeon: Betha Loa, MD;  Location: [facility];  Service: Orthopedics;  Laterality: Right;  EXCISION MASS, right wrist  . TUBAL LIGATION  1989    Current Medications: Current Meds  Medication Sig  . aspirin EC 81 MG tablet Take 81 mg by mouth daily.  . Brexpiprazole (REXULTI) 1 MG TABS Take 2 mg by mouth daily.   . busPIRone (BUSPAR) 15 MG tablet Take 15 mg by mouth 3 (three) times daily.  . carvedilol (COREG) 6.25 MG tablet Take 1 tablet (6.25 mg total) by mouth 2 (two) times daily with a meal.  . ibuprofen (ADVIL,MOTRIN) 200 MG tablet Take 800 mg by mouth every 6 (six) hours as needed for moderate pain.  Marland Kitchen losartan (COZAAR) 100 MG tablet Take 1 tablet (100 mg total) by mouth daily.  . rosuvastatin (CRESTOR) 10 MG tablet Take 1 tablet (10 mg total) by mouth daily.  . sertraline (ZOLOFT) 100 MG tablet Take 200 mg by mouth daily.  Marland Kitchen topiramate (TOPAMAX) 25 MG tablet Take 25 mg by mouth daily. At bedtime  . traZODone (DESYREL) 100 MG tablet Take 100 mg by mouth. 3 at bedtime     Allergies:   Lisinopril and Vicodin [hydrocodone-acetaminophen]   Social History   Social History  . Marital status: Married    Spouse name: N/A  . Number of children: N/A  . Years of education: N/A   Social History Main Topics  . Smoking status: Never Smoker  . Smokeless tobacco: Never Used  . Alcohol use No  . Drug use: No  . Sexual activity: Yes   Other Topics Concern  . None   Social History Narrative  . None     Family History: The patient's family history includes Breast cancer in her maternal aunt and mother; Depression  in her sister; Diabetes in her paternal grandmother; Hypertension in her mother; Prostate cancer in her maternal uncle and maternal uncle; Stroke in her paternal grandmother. ROS:   Please see the history of present illness.     All other systems reviewed and are negative.  EKGs/Labs/Other Studies Reviewed:    The following studies were reviewed today:  Most recent echo 05/07/13 Study Conclusions  - Left ventricle: The cavity size was normal. Wall thickness was normal. The estimated ejection fraction was 55%. Wall motion was normal; there were no regional wall motion abnormalities. Doppler parameters are consistent with abnormal left ventricular relaxation (grade 1 diastolic dysfunction). - Aortic valve: There was no stenosis. - Aorta: Ascending aortic diameter: 39mm (S). - Ascending aorta: The ascending aorta was mildly dilated. - Mitral valve: Mild regurgitation. - Left atrium: The atrium was mildly dilated. - Right ventricle: The cavity size was normal. Systolic function was normal. - Right atrium:  The atrium was mildly dilated. - Tricuspid valve: Peak RV-RA gradient:2mm Hg (S). - Pulmonary arteries: PA peak pressure: 33mm Hg (S). - Inferior vena cava: The vessel was normal in size; the respirophasic diameter changes were in the normal range (= 50%); findings are consistent with normal central venous pressure. Impressions:  - Normal LV size and systolic function, EF 55%. Normal RV size and systolic function. Mild mitral regurgitation.  Cardiac cath 02/17/13 Impression: 1. No angiographic evidence of CAD 2. 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   CTA chest 05/07/14 IMPRESSION: 1. No acute aortic findings, including dissection. 2. Ascending aortic aneurysm with 41 mm diameter. Recommend annual imaging followup by CTA or MRA. This recommendation follows  2010 ACCF/AHA/AATS/ACR/ASA/SCA/SCAI/SIR/STS/SVM Guidelines for the Diagnosis and Management of Patients with Thoracic Aortic Disease. Circulation. 2010; 121: Z610-R604 3. Fibroid uterus.  EKG:  EKG is  ordered today.  The ekg ordered today demonstrates sinus bradycardia at 58 bpm, PRI .210, QTC 445. No significant changes from previous.  Recent Labs: 10/12/2015: ALT 10; BUN 12; Creatinine, Ser 0.66; Potassium 4.5; Sodium 138 01/12/2016: Hemoglobin 11.1; Platelets 272   Recent Lipid Panel    Component Value Date/Time   CHOL 137 01/24/2016 1149   TRIG 68 01/24/2016 1149   HDL 45 01/24/2016 1149   CHOLHDL 3 05/07/2013 1057   VLDL 6.0 05/07/2013 1057   LDLCALC 78 01/24/2016 1149    Physical Exam:    VS:  BP 118/72   Pulse (!) 58   Ht 5\' 6"  (1.676 m)   Wt 253 lb 12.8 oz (115.1 kg)   LMP 01/30/2016   BMI 40.96 kg/m     Wt Readings from Last 3 Encounters:  09/04/16 253 lb 12.8 oz (115.1 kg)  08/30/16 251 lb (113.9 kg)  03/22/16 250 lb (113.4 kg)     GEN:  Well nourished, obese AA female in no acute distress HEENT: Normal NECK: No JVD; No carotid bruits LYMPHATICS: No lymphadenopathy CARDIAC: RRR, no murmurs, rubs, gallops RESPIRATORY:  Clear to auscultation without rales, wheezing or rhonchi  ABDOMEN: Soft, non-tender, non-distended MUSCULOSKELETAL:  No edema; No deformity  SKIN: Warm and dry NEUROLOGIC:  Alert and oriented x 3 PSYCHIATRIC:  Normal affect   ASSESSMENT:    1. Stress-induced cardiomyopathy   2. Ascending aortic aneurysm (HCC)   3. Essential hypertension    PLAN:    In order of problems listed above:  1. Nonischemic cardiomyopathy -Patient had non-ST elevated MI with normal coronaries per cardiac cath and decreased LV function with EF 35% and was thought to be Takotsubo cardiomyopathy. Her LV function returned to normal as of February 2015 on echo -Pt having some vague mild shortness of breath with walking dog. She has been dealing with leg pain  that limits her activity. Also having some orthopnea and PND that she attributes to hormonal changes as she has hot flashes that interfere with sleep. She states occ LE edema, none at present. Pt appears euvolemic. Will check echocardiogram -Pt advised on low sodium, heart healthy diet.  2. hypertension -BP is currently well controlled, continue current meds- carvedilol and losartan -Last BMet was in 09/2015, will check BMet today.  3. Thoracic aortic aneurysm -last CTA aorta in 04/2014 showed ascending aortic aneurysm with 41 mm diameter -Repeat CTA chest for annual monitoring. Will send results to Dr. Clifton James for review.   4. Obesity -BMI 40 -Discussed heart healthy, low sodium diet with calorie reduction   Medication Adjustments/Labs and Tests Ordered:  Current medicines are reviewed at length with the patient today.  Concerns regarding medicines are outlined above. Labs and tests ordered and medication changes are outlined in the patient instructions below:  Patient Instructions  Medication Instructions:  Your physician recommends that you continue on your current medications as directed. Please refer to the Current Medication list given to you today.   Labwork: TODAY BMET  Testing/Procedures: 1. Your physician has requested that you have an echocardiogram. Echocardiography is a painless test that uses sound waves to create images of your heart. It provides your doctor with information about the size and shape of your heart and how well your heart's chambers and valves are working. This procedure takes approximately one hour. There are no restrictions for this procedure.  2. CHEST CT-A (AORTA) WITH AND W/O CONTRAST  Follow-Up: Your physician wants you to follow-up in: 6 MONTHS WITH DR. Clifton James You will receive a reminder letter in the mail two months in advance. If you don't receive a letter, please call our office to schedule the follow-up appointment.   Any Other Special  Instructions Will Be Listed Below (If Applicable).     If you need a refill on your cardiac medications before your next appointment, please call your pharmacy.   Signed, Berton Bon, NP  09/04/2016 12:32 PM    [facility]

## 2016-09-04 ENCOUNTER — Encounter: Payer: Self-pay | Admitting: Cardiology

## 2016-09-04 ENCOUNTER — Ambulatory Visit (INDEPENDENT_AMBULATORY_CARE_PROVIDER_SITE_OTHER): Payer: Medicare Other | Admitting: Cardiology

## 2016-09-04 VITALS — BP 118/72 | HR 58 | Ht 66.0 in | Wt 253.8 lb

## 2016-09-04 DIAGNOSIS — I10 Essential (primary) hypertension: Secondary | ICD-10-CM | POA: Diagnosis not present

## 2016-09-04 DIAGNOSIS — I712 Thoracic aortic aneurysm, without rupture: Secondary | ICD-10-CM | POA: Diagnosis not present

## 2016-09-04 DIAGNOSIS — I5181 Takotsubo syndrome: Secondary | ICD-10-CM

## 2016-09-04 DIAGNOSIS — I7121 Aneurysm of the ascending aorta, without rupture: Secondary | ICD-10-CM

## 2016-09-04 NOTE — Patient Instructions (Signed)
Medication Instructions:  Your physician recommends that you continue on your current medications as directed. Please refer to the Current Medication list given to you today.   Labwork: TODAY BMET  Testing/Procedures: 1. Your physician has requested that you have an echocardiogram. Echocardiography is a painless test that uses sound waves to create images of your heart. It provides your doctor with information about the size and shape of your heart and how well your heart's chambers and valves are working. This procedure takes approximately one hour. There are no restrictions for this procedure.  2. CHEST CT-A (AORTA) WITH AND W/O CONTRAST  Follow-Up: Your physician wants you to follow-up in: 6 MONTHS WITH DR. Clifton JamesMCALHANY You will receive a reminder letter in the mail two months in advance. If you don't receive a letter, please call our office to schedule the follow-up appointment.   Any Other Special Instructions Will Be Listed Below (If Applicable).     If you need a refill on your cardiac medications before your next appointment, please call your pharmacy.

## 2016-09-05 DIAGNOSIS — H3554 Dystrophies primarily involving the retinal pigment epithelium: Secondary | ICD-10-CM | POA: Diagnosis not present

## 2016-09-05 LAB — BASIC METABOLIC PANEL
BUN / CREAT RATIO: 21 (ref 9–23)
BUN: 15 mg/dL (ref 6–24)
CO2: 24 mmol/L (ref 20–29)
CREATININE: 0.7 mg/dL (ref 0.57–1.00)
Calcium: 9.4 mg/dL (ref 8.7–10.2)
Chloride: 104 mmol/L (ref 96–106)
GFR, EST AFRICAN AMERICAN: 114 mL/min/{1.73_m2} (ref >59)
GFR, EST NON AFRICAN AMERICAN: 99 mL/min/{1.73_m2} (ref >59)
Glucose: 117 mg/dL — ABNORMAL HIGH (ref 65–99)
POTASSIUM: 4.7 mmol/L (ref 3.5–5.2)
SODIUM: 141 mmol/L (ref 134–144)

## 2016-09-09 ENCOUNTER — Ambulatory Visit (INDEPENDENT_AMBULATORY_CARE_PROVIDER_SITE_OTHER)
Admission: RE | Admit: 2016-09-09 | Discharge: 2016-09-09 | Disposition: A | Discharge status: Home / Self Care | Payer: Medicare Other | Source: Ambulatory Visit | Attending: Cardiology | Admitting: Cardiology

## 2016-09-09 DIAGNOSIS — I712 Thoracic aortic aneurysm, without rupture: Secondary | ICD-10-CM | POA: Diagnosis not present

## 2016-09-09 DIAGNOSIS — I7121 Aneurysm of the ascending aorta, without rupture: Secondary | ICD-10-CM

## 2016-09-09 MED ORDER — IOPAMIDOL (ISOVUE-370) INJECTION 76%
100.0000 mL | Freq: Once | INTRAVENOUS | Status: AC | PRN
  Administered 2016-09-09: 100 mL via INTRAVENOUS

## 2016-09-11 ENCOUNTER — Ambulatory Visit (HOSPITAL_COMMUNITY): Payer: Medicare Other | Attending: Internal Medicine

## 2016-09-11 ENCOUNTER — Other Ambulatory Visit: Payer: Self-pay

## 2016-09-11 DIAGNOSIS — I7121 Aneurysm of the ascending aorta, without rupture: Secondary | ICD-10-CM

## 2016-09-11 DIAGNOSIS — I712 Thoracic aortic aneurysm, without rupture: Secondary | ICD-10-CM | POA: Insufficient documentation

## 2016-09-12 ENCOUNTER — Telehealth: Payer: Self-pay | Admitting: Cardiovascular Disease

## 2016-09-12 NOTE — Telephone Encounter (Signed)
-----   Message from Berton BonJanine Hammond, NP sent at 09/11/2016  3:11 PM EDT ----- The echocardiogram shows normal pumping function and mild stiffness of the heart walls. The thoracic aortic aneurysm is stable with similar measurements as found on the recent CTA. Will continue to monitor annually. Please follow heart healthy low sodium diet and try to increase physical activity slowly beginning with a 5 minute walk daily and increasing by 5 minutes every few weeks until walking 30 minutes per day.   Please send copy to Julieanne MansonMulberry, Elizabeth, MD  Berton BonJanine Hammond, NP

## 2016-09-12 NOTE — Telephone Encounter (Signed)
Follow up   Pt states she is returning GoldonnaJennifer call and it is ok for Victorino DikeJennifer to leave a complete message on her voicemail if you do not reach her.

## 2016-09-12 NOTE — Telephone Encounter (Signed)
Patient returning your call for results

## 2016-09-12 NOTE — Telephone Encounter (Signed)
I spoke with pt and reviewed echo results with her.  

## 2016-09-16 ENCOUNTER — Other Ambulatory Visit: Payer: Self-pay | Admitting: Internal Medicine

## 2016-09-16 DIAGNOSIS — M25561 Pain in right knee: Principal | ICD-10-CM

## 2016-09-16 DIAGNOSIS — G8929 Other chronic pain: Secondary | ICD-10-CM

## 2016-09-16 NOTE — Progress Notes (Signed)
Referral to ortho due to chronic right knee pain

## 2016-09-18 ENCOUNTER — Other Ambulatory Visit (HOSPITAL_COMMUNITY): Payer: Self-pay

## 2016-10-01 ENCOUNTER — Other Ambulatory Visit (INDEPENDENT_AMBULATORY_CARE_PROVIDER_SITE_OTHER): Payer: Medicare Other

## 2016-10-01 VITALS — BP 132/80 | HR 70

## 2016-10-01 DIAGNOSIS — I10 Essential (primary) hypertension: Secondary | ICD-10-CM | POA: Diagnosis not present

## 2016-12-04 ENCOUNTER — Other Ambulatory Visit: Payer: Self-pay | Admitting: Internal Medicine

## 2016-12-04 DIAGNOSIS — F314 Bipolar disorder, current episode depressed, severe, without psychotic features: Secondary | ICD-10-CM | POA: Diagnosis not present

## 2016-12-12 DIAGNOSIS — M1711 Unilateral primary osteoarthritis, right knee: Secondary | ICD-10-CM | POA: Diagnosis not present

## 2016-12-12 DIAGNOSIS — M1712 Unilateral primary osteoarthritis, left knee: Secondary | ICD-10-CM | POA: Diagnosis not present

## 2017-01-02 ENCOUNTER — Encounter: Payer: Self-pay | Admitting: Internal Medicine

## 2017-01-02 ENCOUNTER — Ambulatory Visit (INDEPENDENT_AMBULATORY_CARE_PROVIDER_SITE_OTHER): Payer: Medicare Other | Admitting: Internal Medicine

## 2017-01-02 VITALS — BP 140/82 | HR 78 | Resp 12 | Ht 66.0 in | Wt 255.0 lb

## 2017-01-02 DIAGNOSIS — Z23 Encounter for immunization: Secondary | ICD-10-CM | POA: Diagnosis not present

## 2017-01-02 DIAGNOSIS — M179 Osteoarthritis of knee, unspecified: Secondary | ICD-10-CM | POA: Insufficient documentation

## 2017-01-02 DIAGNOSIS — M1710 Unilateral primary osteoarthritis, unspecified knee: Secondary | ICD-10-CM | POA: Insufficient documentation

## 2017-01-02 DIAGNOSIS — M170 Bilateral primary osteoarthritis of knee: Secondary | ICD-10-CM | POA: Diagnosis not present

## 2017-01-02 HISTORY — DX: Morbid (severe) obesity due to excess calories: E66.01

## 2017-01-02 HISTORY — DX: Osteoarthritis of knee, unspecified: M17.9

## 2017-01-02 HISTORY — DX: Unilateral primary osteoarthritis, unspecified knee: M17.10

## 2017-01-02 NOTE — Progress Notes (Signed)
   Subjective:    Patient ID: Deborah Williamson, female    DOB: [birth year], 56 y.o.   MRN: [id number]  HPI   1.  Bilateral Knee Pain:  Went to [facility] clinic for her knee as Dr. Merlyn Lot at [facility] is a Hydrographic surveyor only. She apparently saw a PA there.  States her xrays showed her left knee is actually more problematic with bone on bone degeneration.  She had corticosteroid injections to both knees about 3 weeks ago with some improvement in her pain.   Plans MRI in future if pain not adequately controlled.   Current Meds  Medication Sig  . aspirin EC 81 MG tablet Take 81 mg by mouth daily.  . Brexpiprazole (REXULTI) 1 MG TABS Take 2 mg by mouth daily.   . busPIRone (BUSPAR) 15 MG tablet Take 15 mg by mouth 3 (three) times daily.  . carvedilol (COREG) 6.25 MG tablet Take 1 tablet (6.25 mg total) by mouth 2 (two) times daily with a meal.  . ibuprofen (ADVIL,MOTRIN) 200 MG tablet Take 800 mg by mouth every 6 (six) hours as needed for moderate pain.  Marland Kitchen lithium carbonate 300 MG capsule   . losartan (COZAAR) 100 MG tablet TAKE 1 TABLET (100 MG TOTAL) BY MOUTH DAILY.  . prazosin (MINIPRESS) 1 MG capsule   . rosuvastatin (CRESTOR) 10 MG tablet Take 1 tablet (10 mg total) by mouth daily.  . sertraline (ZOLOFT) 100 MG tablet Take 200 mg by mouth daily.  Marland Kitchen topiramate (TOPAMAX) 25 MG tablet Take 25 mg by mouth daily. At bedtime  . traZODone (DESYREL) 100 MG tablet Take 100 mg by mouth. 3 at bedtime    Allergies  Allergen Reactions  . Lisinopril Other (See Comments)    Other reaction(s): Other (See Comments) Dry cough Dry cough  . Vicodin [Hydrocodone-Acetaminophen] Nausea Only    Review of Systems     Objective:   Physical Exam NAD Lungs:  CTA CV: RRR without murmur or rub.  Radial pulses normal and eqaul LE:  Limping gait       Assessment & Plan:  1.  Bilateral DJD of Knees:  As per ortho.  Encouraged weight loss with more non weight bearing  activities such as cycling and swimming.  2.  Morbid obesity:  As above.  Discussed lifestyle changes again.  To work on diet and see Henreitta Cea for help with this.  To check in with [facility]  3.  HM:  Influenza vaccine  3.  HM:  Flu vaccine.

## 2017-01-09 DIAGNOSIS — M25561 Pain in right knee: Secondary | ICD-10-CM | POA: Diagnosis not present

## 2017-01-20 ENCOUNTER — Telehealth: Payer: Self-pay | Admitting: Cardiovascular Disease

## 2017-01-20 NOTE — Telephone Encounter (Signed)
° °  [facility] Pre-operative Risk Assessment    Request for surgical clearance:  1. What type of surgery is being performed? RIGHT KNEE ARTHROSCOPY  2. When is this surgery scheduled? UNDETERMINED  3. Are there any medications that need to be held prior to surgery and how long? PLEASE INFORM OF ANY MEDICATION THAT NEED TO BE HELD AND HOW LONG.   4. Practice name and name of physician performing surgery? [facility]  5. What is your office phone and fax number?  PH# [phone], FAX: [phone] ATT: NICOLE CORBIN  6. Anesthesia type (None, local, MAC, general) ? NOT LISTED    Deborah Williamson 01/20/2017, 2:32 PM  _________________________________________________________________   (provider comments below)

## 2017-01-21 NOTE — Telephone Encounter (Signed)
      Chart reviewed as part of pre-operative protocol coverage. Patient was contacted 01/21/2017 in reference to pre-operative risk assessment for pending surgery as outlined below.  Deborah Williamson was last seen on 09/04/16  by Deborah Williamson, [place], Deborah Williamson  Since that day, Deborah Williamson has done well. DASI in 6.05 METS. Unable to do aggressive activity due to knee issue.   Therefore, based on ACC/AHA guidelines, the patient would be at acceptable risk for the planned procedure without further cardiovascular testing.   Deborah Williamson please advice regarding aspirin.   DaveyBhagat,Parnika Tweten, PA 01/21/2017, 2:52 PM

## 2017-01-22 NOTE — Telephone Encounter (Signed)
OK to hold ASA. Deborah Williamson  

## 2017-01-23 NOTE — Telephone Encounter (Signed)
   Chart reviewed as part of pre-operative protocol coverage.   Pre-operative decisions have already been made as listed below. Per usual recommendations would resume aspirin as soon as able to when felt safe by surgery. Call with questions. Will route this phone note to requesting party and close encounter.  Laurann Montanaayna N Diora Bellizzi, PA-C 01/23/2017, 8:33 AM

## 2017-02-03 ENCOUNTER — Other Ambulatory Visit: Payer: Self-pay | Admitting: Internal Medicine

## 2017-02-03 ENCOUNTER — Telehealth: Payer: Self-pay | Admitting: Internal Medicine

## 2017-02-03 NOTE — Telephone Encounter (Signed)
Patient called to let Dr. Delrae AlfredMulberry know that she is having surgery on November 14th on right knee.  Within 30 days will have total knee replacement on left knee.  Surgery to be performed by Dr. Harvie JuniorJohn L. Graves, [facility].

## 2017-02-03 NOTE — Telephone Encounter (Signed)
To Dr. Mulberry FYI 

## 2017-02-05 DIAGNOSIS — M23221 Derangement of posterior horn of medial meniscus due to old tear or injury, right knee: Secondary | ICD-10-CM | POA: Diagnosis not present

## 2017-02-05 DIAGNOSIS — M6751 Plica syndrome, right knee: Secondary | ICD-10-CM | POA: Diagnosis not present

## 2017-02-05 DIAGNOSIS — M94261 Chondromalacia, right knee: Secondary | ICD-10-CM | POA: Diagnosis not present

## 2017-02-05 HISTORY — PX: KNEE ARTHROSCOPY: SUR90

## 2017-02-10 DIAGNOSIS — M25561 Pain in right knee: Secondary | ICD-10-CM | POA: Diagnosis not present

## 2017-02-24 DIAGNOSIS — M25561 Pain in right knee: Secondary | ICD-10-CM | POA: Diagnosis not present

## 2017-02-24 DIAGNOSIS — Z9889 Other specified postprocedural states: Secondary | ICD-10-CM | POA: Diagnosis not present

## 2017-02-24 DIAGNOSIS — M25661 Stiffness of right knee, not elsewhere classified: Secondary | ICD-10-CM | POA: Diagnosis not present

## 2017-02-26 DIAGNOSIS — M25561 Pain in right knee: Secondary | ICD-10-CM | POA: Diagnosis not present

## 2017-02-26 DIAGNOSIS — M25661 Stiffness of right knee, not elsewhere classified: Secondary | ICD-10-CM | POA: Diagnosis not present

## 2017-02-27 ENCOUNTER — Other Ambulatory Visit: Payer: Self-pay | Admitting: Orthopedic Surgery

## 2017-03-03 NOTE — Pre-Procedure Instructions (Signed)
Deborah Williamson  03/03/2017      [facility] - [place], [place] - [address] [place] [address] Phone: [phone] Fax: [phone]    Your procedure is scheduled on Mon. Dec. 17  [facility] Admitting at 1:00 P.M.  Call this number if you have problems the morning of surgery:  [phone]   Remember:  Do not eat food or drink liquids after midnight on Sun. Dec. 16   Take these medicines the morning of surgery with A SIP OF WATER : tylenol or hydrocodone if needed, carvedilol (coreg), zofran if needed,  7 days prior to surgery STOP taking any Aspirin (unless otherwise instructed by your surgeon), Aleve, Naproxen, Ibuprofen, Motrin, Advil, Goody's, BC's, all herbal medications, fish oil, and all vitamins   Do not wear jewelry, make-up or nail polish.  Do not wear lotions, powders, or perfumes, or deodorant.  Do not shave 48 hours prior to surgery.  Men may shave face and neck.  Do not bring valuables to the hospital.  [facility] is not responsible for any belongings or valuables.  Contacts, dentures or bridgework may not be worn into surgery.  Leave your suitcase in the car.  After surgery it may be brought to your room.  For patients admitted to the hospital, discharge time will be determined by your treatment team.  Patients discharged the day of surgery will not be allowed to drive home.    Special instructions:   - Preparing For Surgery  Before surgery, you can play an important role. Because skin is not sterile, your skin needs to be as free of germs as possible. You can reduce the number of germs on your skin by washing with CHG (chlorahexidine gluconate) Soap before surgery.  CHG is an antiseptic cleaner which kills germs and bonds with the skin to continue killing germs even after washing.  Please do not use if you have an allergy to CHG or antibacterial soaps. If your skin becomes  reddened/irritated stop using the CHG.  Do not shave (including legs and underarms) for at least 48 hours prior to first CHG shower. It is OK to shave your face.  Please follow these instructions carefully.   1. Shower the NIGHT BEFORE SURGERY and the MORNING OF SURGERY with CHG.   2. If you chose to wash your hair, wash your hair first as usual with your normal shampoo.  3. After you shampoo, rinse your hair and body thoroughly to remove the shampoo.  4. Use CHG as you would any other liquid soap. You can apply CHG directly to the skin and wash gently with a scrungie or a clean washcloth.   5. Apply the CHG Soap to your body ONLY FROM THE NECK DOWN.  Do not use on open wounds or open sores. Avoid contact with your eyes, ears, mouth and genitals (private parts). Wash Face and genitals (private parts)  with your normal soap.  6. Wash thoroughly, paying special attention to the area where your surgery will be performed.  7. Thoroughly rinse your body with warm water from the neck down.  8. DO NOT shower/wash with your normal soap after using and rinsing off the CHG Soap.  9. Pat yourself dry with a CLEAN TOWEL.  10. Wear CLEAN PAJAMAS to bed the night before surgery, wear comfortable clothes the morning of surgery  11. Place CLEAN SHEETS on your bed the night of your first  shower and DO NOT SLEEP WITH PETS.    Day of Surgery: Do not apply any deodorants/lotions. Please wear clean clothes to the hospital/surgery center.      Please read over the following fact sheets that you were given. Coughing and Deep Breathing, Total Joint Packet, MRSA Information and Surgical Site Infection Prevention

## 2017-03-04 ENCOUNTER — Encounter (HOSPITAL_COMMUNITY): Payer: Self-pay

## 2017-03-04 ENCOUNTER — Other Ambulatory Visit: Payer: Self-pay

## 2017-03-04 ENCOUNTER — Other Ambulatory Visit: Payer: Self-pay | Admitting: Orthopedic Surgery

## 2017-03-04 ENCOUNTER — Encounter (HOSPITAL_COMMUNITY)
Admission: RE | Admit: 2017-03-04 | Discharge: 2017-03-04 | Disposition: A | Discharge status: Home / Self Care | Payer: Commercial Managed Care - PPO | Source: Ambulatory Visit | Attending: Orthopedic Surgery | Admitting: Orthopedic Surgery

## 2017-03-04 ENCOUNTER — Ambulatory Visit (HOSPITAL_COMMUNITY)
Admission: RE | Admit: 2017-03-04 | Discharge: 2017-03-04 | Disposition: A | Discharge status: Home / Self Care | Payer: Commercial Managed Care - PPO | Source: Ambulatory Visit | Attending: Orthopedic Surgery | Admitting: Orthopedic Surgery

## 2017-03-04 DIAGNOSIS — M1712 Unilateral primary osteoarthritis, left knee: Secondary | ICD-10-CM | POA: Insufficient documentation

## 2017-03-04 DIAGNOSIS — Z01812 Encounter for preprocedural laboratory examination: Secondary | ICD-10-CM | POA: Insufficient documentation

## 2017-03-04 DIAGNOSIS — Z01818 Encounter for other preprocedural examination: Secondary | ICD-10-CM | POA: Insufficient documentation

## 2017-03-04 DIAGNOSIS — M25561 Pain in right knee: Secondary | ICD-10-CM | POA: Diagnosis not present

## 2017-03-04 DIAGNOSIS — M25661 Stiffness of right knee, not elsewhere classified: Secondary | ICD-10-CM | POA: Diagnosis not present

## 2017-03-04 LAB — COMPREHENSIVE METABOLIC PANEL
ALBUMIN: 3.4 g/dL — AB (ref 3.5–5.0)
ALK PHOS: 64 U/L (ref 38–126)
ALT: 12 U/L — AB (ref 14–54)
AST: 18 U/L (ref 15–41)
Anion gap: 5 (ref 5–15)
BILIRUBIN TOTAL: 0.8 mg/dL (ref 0.3–1.2)
BUN: 14 mg/dL (ref 6–20)
CALCIUM: 8.9 mg/dL (ref 8.9–10.3)
CO2: 26 mmol/L (ref 22–32)
Chloride: 107 mmol/L (ref 101–111)
Creatinine, Ser: 0.78 mg/dL (ref 0.44–1.00)
GFR calc Af Amer: >60 mL/min (ref >60)
GFR calc non Af Amer: >60 mL/min (ref >60)
GLUCOSE: 93 mg/dL (ref 65–99)
Potassium: 3.8 mmol/L (ref 3.5–5.1)
Sodium: 138 mmol/L (ref 135–145)
TOTAL PROTEIN: 7.1 g/dL (ref 6.5–8.1)

## 2017-03-04 LAB — CBC WITH DIFFERENTIAL/PLATELET
BASOS ABS: 0 10*3/uL (ref 0.0–0.1)
BASOS PCT: 0 %
Eosinophils Absolute: 0.1 10*3/uL (ref 0.0–0.7)
Eosinophils Relative: 3 %
HEMATOCRIT: 35.8 % — AB (ref 36.0–46.0)
HEMOGLOBIN: 11.4 g/dL — AB (ref 12.0–15.0)
Lymphocytes Relative: 44 %
Lymphs Abs: 1.8 10*3/uL (ref 0.7–4.0)
MCH: 25.9 pg — ABNORMAL LOW (ref 26.0–34.0)
MCHC: 31.8 g/dL (ref 30.0–36.0)
MCV: 81.4 fL (ref 78.0–100.0)
MONOS PCT: 5 %
Monocytes Absolute: 0.2 10*3/uL (ref 0.1–1.0)
NEUTROS ABS: 2 10*3/uL (ref 1.7–7.7)
NEUTROS PCT: 48 %
Platelets: 270 10*3/uL (ref 150–400)
RBC: 4.4 MIL/uL (ref 3.87–5.11)
RDW: 14.1 % (ref 11.5–15.5)
WBC: 4.1 10*3/uL (ref 4.0–10.5)

## 2017-03-04 LAB — URINALYSIS, ROUTINE W REFLEX MICROSCOPIC
Bilirubin Urine: NEGATIVE
GLUCOSE, UA: NEGATIVE mg/dL
KETONES UR: NEGATIVE mg/dL
Leukocytes, UA: NEGATIVE
NITRITE: NEGATIVE
PH: 6 (ref 5.0–8.0)
PROTEIN: NEGATIVE mg/dL
Specific Gravity, Urine: 1.02 (ref 1.005–1.030)

## 2017-03-04 LAB — TYPE AND SCREEN
ABO/RH(D): A POS
Antibody Screen: NEGATIVE

## 2017-03-04 LAB — PROTIME-INR
INR: 1.1
Prothrombin Time: 14.1 seconds (ref 11.4–15.2)

## 2017-03-04 LAB — SURGICAL PCR SCREEN
MRSA, PCR: NEGATIVE
STAPHYLOCOCCUS AUREUS: NEGATIVE

## 2017-03-04 LAB — APTT: aPTT: 31 seconds (ref 24–36)

## 2017-03-04 LAB — ABO/RH: ABO/RH(D): A POS

## 2017-03-04 LAB — HCG, SERUM, QUALITATIVE: Preg, Serum: NEGATIVE

## 2017-03-04 NOTE — Progress Notes (Signed)
PCP:Dr. Ellin SabaElizabeth Williamson  Cardiologist: Lilian ComaScott Weaver,PA , Dr. Clifton JamesMcAlhany  Pt. Reports she has not been notified to stop aspirin, Dr. Clifton JamesMcAlhany notes states okay to stop it but doesn't state when. Instructed pt. To call Dr. Luiz BlareGraves office concerning this. Pt. Understood and agreed.

## 2017-03-05 DIAGNOSIS — F314 Bipolar disorder, current episode depressed, severe, without psychotic features: Secondary | ICD-10-CM | POA: Diagnosis not present

## 2017-03-05 NOTE — Progress Notes (Signed)
Anesthesia Chart Review:  Pt is a 56 year old female scheduled for L total knee arthroplasty on 03/10/2017 with Jodi GeraldsJohn Graves, MD  - PCP is Julieanne MansonElizabeth Mulberry, MD - Cardiologist is Verne Carrowhristopher McAlhany, MD. Last office visit 01/21/17.  Pt cleared for surgery 01/21/17 by Manson PasseyBhavinkumar Bhagat, PA  PMH includes:  Takotsubo cardiomyopathy, ascending aortic aneurysm (4.0cm by 09/11/16 echo), HTN, hyperlipidemia, anemia, bipolar disorder, PTSD. Never smoker. BMI 40.5  Medications include: ASA 81mg , carvedilol, losartan, rosuvastatin  BP (!) 156/81   Pulse 65   Temp 36.8 C (Oral)   Resp 18   Ht 5\' 6"  (1.676 m)   Wt 250 lb 14.1 oz (113.8 kg)   LMP 01/14/2017   SpO2 100%   BMI 40.49 kg/m    Preoperative labs reviewed.    CXR 03/04/17: No active cardiopulmonary disease.  EKG 09/04/16: sinus bradycardia with 1st degree AV block.   Echo 09/11/16:  - Left ventricle: The cavity size was normal. Wall thickness was normal. Systolic function was normal. The estimated ejection fraction was in the range of 55% to 60%. Wall motion was normal; there were no regional wall motion abnormalities. Doppler parameters are consistent with abnormal left ventricular relaxation (grade 1 diastolic dysfunction). The E/e&' ratio is between 8-15, suggesting indeterminate LV filling pressure. - Aorta: Ascending aortic diameter: 40 mm (S). - Ascending aorta: The ascending aorta was mildly dilated. - Mitral valve: Mildly thickened leaflets . There was trivial regurgitation. - Left atrium: The atrium was normal in size. - Right atrium: The atrium was mildly dilated. - Tricuspid valve: There was mild regurgitation. - Pulmonary arteries: PA peak pressure: 28 mm Hg (S). - Inferior vena cava: The vessel was normal in size. The respirophasic diameter changes were in the normal range (>= 50%), consistent with normal central venous pressure.  -Impressions: Compared to a prior study in 2015, the LVEF is stable at 55-60%. The  ascending aorta is slightly larger at 4.0 cm.  Cardiac cath 02/17/13:  1. No angiographic evidence of CAD 2. 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   If no changes, I anticipate pt can proceed with surgery as scheduled.   Rica Mastngela Easter Kennebrew, FNP-BC [facility] Phone: [phone] 03/05/2017 11:32 AM

## 2017-03-07 ENCOUNTER — Other Ambulatory Visit: Payer: Self-pay | Admitting: Orthopedic Surgery

## 2017-03-07 MED ORDER — CEFAZOLIN SODIUM-DEXTROSE 2-4 GM/100ML-% IV SOLN
2.0000 g | INTRAVENOUS | Status: AC
  Administered 2017-03-10: 2 g via INTRAVENOUS
  Filled 2017-03-07: qty 100

## 2017-03-10 ENCOUNTER — Encounter (HOSPITAL_COMMUNITY)
Admission: RE | Disposition: A | Discharge status: Home / Self Care | Payer: Self-pay | Source: Ambulatory Visit | Attending: Orthopedic Surgery

## 2017-03-10 ENCOUNTER — Inpatient Hospital Stay (HOSPITAL_COMMUNITY): Payer: Commercial Managed Care - PPO | Admitting: Certified Registered"

## 2017-03-10 ENCOUNTER — Telehealth: Payer: Self-pay | Admitting: Internal Medicine

## 2017-03-10 ENCOUNTER — Inpatient Hospital Stay (HOSPITAL_COMMUNITY)
Admission: RE | Admit: 2017-03-10 | Discharge: 2017-03-12 | DRG: 470 | Disposition: A | Discharge status: Home / Self Care | Payer: Commercial Managed Care - PPO | Source: Ambulatory Visit | Attending: Orthopedic Surgery | Admitting: Orthopedic Surgery

## 2017-03-10 ENCOUNTER — Encounter (HOSPITAL_COMMUNITY): Payer: Self-pay | Admitting: Anesthesiology

## 2017-03-10 ENCOUNTER — Inpatient Hospital Stay (HOSPITAL_COMMUNITY): Payer: Commercial Managed Care - PPO | Admitting: Emergency Medicine

## 2017-03-10 DIAGNOSIS — Z8249 Family history of ischemic heart disease and other diseases of the circulatory system: Secondary | ICD-10-CM | POA: Diagnosis not present

## 2017-03-10 DIAGNOSIS — Z833 Family history of diabetes mellitus: Secondary | ICD-10-CM | POA: Diagnosis not present

## 2017-03-10 DIAGNOSIS — I252 Old myocardial infarction: Secondary | ICD-10-CM | POA: Diagnosis not present

## 2017-03-10 DIAGNOSIS — I712 Thoracic aortic aneurysm, without rupture: Secondary | ICD-10-CM | POA: Diagnosis present

## 2017-03-10 DIAGNOSIS — Z823 Family history of stroke: Secondary | ICD-10-CM

## 2017-03-10 DIAGNOSIS — Z6841 Body Mass Index (BMI) 40.0 and over, adult: Secondary | ICD-10-CM

## 2017-03-10 DIAGNOSIS — G4700 Insomnia, unspecified: Secondary | ICD-10-CM | POA: Diagnosis present

## 2017-03-10 DIAGNOSIS — Z803 Family history of malignant neoplasm of breast: Secondary | ICD-10-CM

## 2017-03-10 DIAGNOSIS — F429 Obsessive-compulsive disorder, unspecified: Secondary | ICD-10-CM | POA: Diagnosis present

## 2017-03-10 DIAGNOSIS — M1712 Unilateral primary osteoarthritis, left knee: Principal | ICD-10-CM | POA: Diagnosis present

## 2017-03-10 DIAGNOSIS — F419 Anxiety disorder, unspecified: Secondary | ICD-10-CM | POA: Diagnosis present

## 2017-03-10 DIAGNOSIS — Z818 Family history of other mental and behavioral disorders: Secondary | ICD-10-CM

## 2017-03-10 DIAGNOSIS — E785 Hyperlipidemia, unspecified: Secondary | ICD-10-CM | POA: Diagnosis present

## 2017-03-10 DIAGNOSIS — F3181 Bipolar II disorder: Secondary | ICD-10-CM | POA: Diagnosis present

## 2017-03-10 DIAGNOSIS — Z888 Allergy status to other drugs, medicaments and biological substances status: Secondary | ICD-10-CM

## 2017-03-10 DIAGNOSIS — F4310 Post-traumatic stress disorder, unspecified: Secondary | ICD-10-CM | POA: Diagnosis present

## 2017-03-10 DIAGNOSIS — M25762 Osteophyte, left knee: Secondary | ICD-10-CM | POA: Diagnosis present

## 2017-03-10 DIAGNOSIS — Z885 Allergy status to narcotic agent status: Secondary | ICD-10-CM | POA: Diagnosis not present

## 2017-03-10 DIAGNOSIS — I10 Essential (primary) hypertension: Secondary | ICD-10-CM | POA: Diagnosis present

## 2017-03-10 DIAGNOSIS — R110 Nausea: Secondary | ICD-10-CM | POA: Diagnosis not present

## 2017-03-10 HISTORY — PX: TOTAL KNEE ARTHROPLASTY: SHX125

## 2017-03-10 SURGERY — ARTHROPLASTY, KNEE, TOTAL
Anesthesia: Spinal | Site: Knee | Laterality: Left

## 2017-03-10 MED ORDER — FENTANYL CITRATE (PF) 100 MCG/2ML IJ SOLN
100.0000 ug | Freq: Once | INTRAMUSCULAR | Status: AC
  Administered 2017-03-10: 100 ug via INTRAVENOUS

## 2017-03-10 MED ORDER — DEXAMETHASONE SODIUM PHOSPHATE 10 MG/ML IJ SOLN
INTRAMUSCULAR | Status: AC
  Filled 2017-03-10: qty 1

## 2017-03-10 MED ORDER — SODIUM CHLORIDE 0.9 % IR SOLN
Status: DC | PRN
  Administered 2017-03-10: 3000 mL

## 2017-03-10 MED ORDER — ALUM & MAG HYDROXIDE-SIMETH 200-200-20 MG/5ML PO SUSP: 30.0000 mL | ORAL | Status: DC | PRN

## 2017-03-10 MED ORDER — HYDROMORPHONE HCL 1 MG/ML IJ SOLN
INTRAMUSCULAR | Status: AC
Start: 2017-03-10 — End: 2017-03-11
  Filled 2017-03-10: qty 1

## 2017-03-10 MED ORDER — SUGAMMADEX SODIUM 200 MG/2ML IV SOLN
INTRAVENOUS | Status: AC
  Filled 2017-03-10: qty 2

## 2017-03-10 MED ORDER — FENTANYL CITRATE (PF) 250 MCG/5ML IJ SOLN
INTRAMUSCULAR | Status: AC
  Filled 2017-03-10: qty 5

## 2017-03-10 MED ORDER — LACTATED RINGERS IV SOLN
INTRAVENOUS | Status: DC
  Administered 2017-03-10: 10 mL/h via INTRAVENOUS

## 2017-03-10 MED ORDER — CHLORHEXIDINE GLUCONATE 4 % EX LIQD: 60.0000 mL | Freq: Once | CUTANEOUS | Status: DC

## 2017-03-10 MED ORDER — BUPIVACAINE HCL (PF) 0.5 % IJ SOLN
INTRAMUSCULAR | Status: DC | PRN
  Administered 2017-03-10: 30 mL

## 2017-03-10 MED ORDER — DOCUSATE SODIUM 100 MG PO CAPS
100.0000 mg | ORAL_CAPSULE | Freq: Two times a day (BID) | ORAL | Status: DC
  Administered 2017-03-10 – 2017-03-12 (×4): 100 mg via ORAL
  Filled 2017-03-10 (×4): qty 1

## 2017-03-10 MED ORDER — HYDROMORPHONE HCL 1 MG/ML IJ SOLN
1.0000 mg | INTRAMUSCULAR | Status: DC | PRN
  Administered 2017-03-10 (×2): 1 mg via INTRAVENOUS
  Administered 2017-03-11 (×3): 2 mg via INTRAVENOUS
  Filled 2017-03-10 (×3): qty 2

## 2017-03-10 MED ORDER — DEXAMETHASONE SODIUM PHOSPHATE 10 MG/ML IJ SOLN
10.0000 mg | Freq: Two times a day (BID) | INTRAMUSCULAR | Status: AC
  Administered 2017-03-10 – 2017-03-11 (×3): 10 mg via INTRAVENOUS
  Filled 2017-03-10 (×3): qty 1

## 2017-03-10 MED ORDER — ONDANSETRON HCL 4 MG/2ML IJ SOLN
4.0000 mg | Freq: Four times a day (QID) | INTRAMUSCULAR | Status: DC | PRN
  Administered 2017-03-10 – 2017-03-11 (×3): 4 mg via INTRAVENOUS
  Filled 2017-03-10 (×2): qty 2

## 2017-03-10 MED ORDER — MIDAZOLAM HCL 2 MG/2ML IJ SOLN
2.0000 mg | Freq: Once | INTRAMUSCULAR | Status: AC
  Administered 2017-03-10: 2 mg via INTRAVENOUS

## 2017-03-10 MED ORDER — HYDROMORPHONE HCL 1 MG/ML IJ SOLN
INTRAMUSCULAR | Status: AC
  Administered 2017-03-10: 1 mg via INTRAVENOUS
  Filled 2017-03-10: qty 1

## 2017-03-10 MED ORDER — ROPIVACAINE HCL 7.5 MG/ML IJ SOLN
INTRAMUSCULAR | Status: DC | PRN
  Administered 2017-03-10: 20 mL via PERINEURAL

## 2017-03-10 MED ORDER — OXYCODONE HCL 5 MG PO TABS
5.0000 mg | ORAL_TABLET | ORAL | Status: DC | PRN
  Administered 2017-03-10 – 2017-03-12 (×8): 10 mg via ORAL
  Filled 2017-03-10 (×7): qty 2

## 2017-03-10 MED ORDER — LIDOCAINE 2% (20 MG/ML) 5 ML SYRINGE
INTRAMUSCULAR | Status: AC
  Filled 2017-03-10: qty 5

## 2017-03-10 MED ORDER — BISACODYL 5 MG PO TBEC: 5.0000 mg | DELAYED_RELEASE_TABLET | Freq: Every day | ORAL | Status: DC | PRN

## 2017-03-10 MED ORDER — ASPIRIN EC 325 MG PO TBEC: 325.0000 mg | DELAYED_RELEASE_TABLET | Freq: Two times a day (BID) | ORAL | 0 refills | Status: DC

## 2017-03-10 MED ORDER — ACETAMINOPHEN 650 MG RE SUPP: 650.0000 mg | RECTAL | Status: DC | PRN

## 2017-03-10 MED ORDER — FENTANYL CITRATE (PF) 100 MCG/2ML IJ SOLN
INTRAMUSCULAR | Status: AC
  Administered 2017-03-10: 100 ug via INTRAVENOUS
  Filled 2017-03-10: qty 2

## 2017-03-10 MED ORDER — CEFAZOLIN SODIUM-DEXTROSE 2-4 GM/100ML-% IV SOLN
2.0000 g | Freq: Four times a day (QID) | INTRAVENOUS | Status: AC
  Administered 2017-03-10 – 2017-03-11 (×2): 2 g via INTRAVENOUS
  Filled 2017-03-10 (×2): qty 100

## 2017-03-10 MED ORDER — BUPIVACAINE LIPOSOME 1.3 % IJ SUSP
20.0000 mL | Freq: Once | INTRAMUSCULAR | Status: AC
  Administered 2017-03-10: 20 mL
  Filled 2017-03-10: qty 20

## 2017-03-10 MED ORDER — ONDANSETRON HCL 4 MG/2ML IJ SOLN
INTRAMUSCULAR | Status: AC
  Filled 2017-03-10: qty 2

## 2017-03-10 MED ORDER — OXYCODONE HCL 5 MG/5ML PO SOLN: 5.0000 mg | Freq: Once | ORAL | Status: DC | PRN

## 2017-03-10 MED ORDER — HYDROMORPHONE HCL 1 MG/ML IJ SOLN
INTRAMUSCULAR | Status: AC
  Administered 2017-03-10: 0.5 mg via INTRAVENOUS
  Filled 2017-03-10: qty 1

## 2017-03-10 MED ORDER — DEXAMETHASONE SODIUM PHOSPHATE 10 MG/ML IJ SOLN
INTRAMUSCULAR | Status: DC | PRN
  Administered 2017-03-10: 10 mg via INTRAVENOUS

## 2017-03-10 MED ORDER — FENTANYL CITRATE (PF) 100 MCG/2ML IJ SOLN
INTRAMUSCULAR | Status: DC | PRN
Start: 2017-03-10 — End: 2017-03-10
  Administered 2017-03-10 (×3): 50 ug via INTRAVENOUS

## 2017-03-10 MED ORDER — METHOCARBAMOL 500 MG PO TABS
ORAL_TABLET | ORAL | Status: AC
  Administered 2017-03-10: 500 mg via ORAL
  Filled 2017-03-10: qty 1

## 2017-03-10 MED ORDER — ROSUVASTATIN CALCIUM 10 MG PO TABS
10.0000 mg | ORAL_TABLET | Freq: Every day | ORAL | Status: DC
  Administered 2017-03-10 – 2017-03-12 (×3): 10 mg via ORAL
  Filled 2017-03-10 (×3): qty 1

## 2017-03-10 MED ORDER — MIDAZOLAM HCL 2 MG/2ML IJ SOLN
INTRAMUSCULAR | Status: AC
  Filled 2017-03-10: qty 2

## 2017-03-10 MED ORDER — 0.9 % SODIUM CHLORIDE (POUR BTL) OPTIME
TOPICAL | Status: DC | PRN
  Administered 2017-03-10: 1000 mL

## 2017-03-10 MED ORDER — PHENYLEPHRINE 40 MCG/ML (10ML) SYRINGE FOR IV PUSH (FOR BLOOD PRESSURE SUPPORT)
PREFILLED_SYRINGE | INTRAVENOUS | Status: DC | PRN
  Administered 2017-03-10: 80 ug via INTRAVENOUS

## 2017-03-10 MED ORDER — OXYCODONE-ACETAMINOPHEN 5-325 MG PO TABS: 1.0000 | ORAL_TABLET | Freq: Four times a day (QID) | ORAL | 0 refills | Status: DC | PRN

## 2017-03-10 MED ORDER — DIPHENHYDRAMINE HCL 12.5 MG/5ML PO ELIX
12.5000 mg | ORAL_SOLUTION | ORAL | Status: DC | PRN
  Administered 2017-03-11 – 2017-03-12 (×3): 25 mg via ORAL
  Filled 2017-03-10 (×3): qty 10

## 2017-03-10 MED ORDER — ONDANSETRON HCL 4 MG PO TABS: 4.0000 mg | ORAL_TABLET | Freq: Four times a day (QID) | ORAL | Status: DC | PRN

## 2017-03-10 MED ORDER — TIZANIDINE HCL 2 MG PO TABS: 2.0000 mg | ORAL_TABLET | Freq: Three times a day (TID) | ORAL | 0 refills | Status: DC | PRN

## 2017-03-10 MED ORDER — TRANEXAMIC ACID 1000 MG/10ML IV SOLN
1000.0000 mg | Freq: Once | INTRAVENOUS | Status: AC
  Administered 2017-03-10: 1000 mg via INTRAVENOUS
  Filled 2017-03-10 (×2): qty 10

## 2017-03-10 MED ORDER — METHOCARBAMOL 500 MG PO TABS
500.0000 mg | ORAL_TABLET | Freq: Four times a day (QID) | ORAL | Status: DC | PRN
  Administered 2017-03-10 – 2017-03-11 (×3): 500 mg via ORAL
  Filled 2017-03-10 (×2): qty 1

## 2017-03-10 MED ORDER — ACETAMINOPHEN 325 MG PO TABS
650.0000 mg | ORAL_TABLET | ORAL | Status: DC | PRN
  Administered 2017-03-12: 650 mg via ORAL
  Filled 2017-03-10: qty 2

## 2017-03-10 MED ORDER — PHENYLEPHRINE 40 MCG/ML (10ML) SYRINGE FOR IV PUSH (FOR BLOOD PRESSURE SUPPORT)
PREFILLED_SYRINGE | INTRAVENOUS | Status: AC
  Filled 2017-03-10: qty 10

## 2017-03-10 MED ORDER — GABAPENTIN 300 MG PO CAPS
300.0000 mg | ORAL_CAPSULE | Freq: Two times a day (BID) | ORAL | Status: DC
  Administered 2017-03-10 – 2017-03-12 (×4): 300 mg via ORAL
  Filled 2017-03-10 (×4): qty 1

## 2017-03-10 MED ORDER — BUPIVACAINE HCL (PF) 0.5 % IJ SOLN
INTRAMUSCULAR | Status: AC
  Filled 2017-03-10: qty 30

## 2017-03-10 MED ORDER — LACTATED RINGERS IV SOLN
INTRAVENOUS | Status: DC | PRN
  Administered 2017-03-10 (×2): via INTRAVENOUS

## 2017-03-10 MED ORDER — MIDAZOLAM HCL 2 MG/2ML IJ SOLN
INTRAMUSCULAR | Status: AC
  Administered 2017-03-10: 2 mg via INTRAVENOUS
  Filled 2017-03-10: qty 2

## 2017-03-10 MED ORDER — PROPOFOL 10 MG/ML IV BOLUS
INTRAVENOUS | Status: DC | PRN
  Administered 2017-03-10: 170 mg via INTRAVENOUS

## 2017-03-10 MED ORDER — CARVEDILOL 6.25 MG PO TABS
6.2500 mg | ORAL_TABLET | Freq: Two times a day (BID) | ORAL | Status: DC
  Administered 2017-03-11 – 2017-03-12 (×4): 6.25 mg via ORAL
  Filled 2017-03-10 (×4): qty 1

## 2017-03-10 MED ORDER — CELECOXIB 200 MG PO CAPS
200.0000 mg | ORAL_CAPSULE | Freq: Two times a day (BID) | ORAL | Status: DC
  Administered 2017-03-10 – 2017-03-12 (×4): 200 mg via ORAL
  Filled 2017-03-10 (×4): qty 1

## 2017-03-10 MED ORDER — SODIUM CHLORIDE 0.9 % IJ SOLN
INTRAMUSCULAR | Status: DC | PRN
  Administered 2017-03-10: 20 mL

## 2017-03-10 MED ORDER — MAGNESIUM CITRATE PO SOLN: 1.0000 | Freq: Once | ORAL | Status: DC | PRN

## 2017-03-10 MED ORDER — TRANEXAMIC ACID 1000 MG/10ML IV SOLN
1000.0000 mg | INTRAVENOUS | Status: AC
  Administered 2017-03-10: 1000 mg via INTRAVENOUS
  Filled 2017-03-10: qty 10

## 2017-03-10 MED ORDER — HYDRALAZINE HCL 20 MG/ML IJ SOLN
INTRAMUSCULAR | Status: AC
  Filled 2017-03-10: qty 1

## 2017-03-10 MED ORDER — ROCURONIUM BROMIDE 100 MG/10ML IV SOLN
INTRAVENOUS | Status: DC | PRN
  Administered 2017-03-10: 50 mg via INTRAVENOUS

## 2017-03-10 MED ORDER — LOSARTAN POTASSIUM 50 MG PO TABS
100.0000 mg | ORAL_TABLET | Freq: Every day | ORAL | Status: DC
  Administered 2017-03-10 – 2017-03-12 (×3): 100 mg via ORAL
  Filled 2017-03-10 (×3): qty 2

## 2017-03-10 MED ORDER — METHOCARBAMOL 1000 MG/10ML IJ SOLN: 500.0000 mg | Freq: Four times a day (QID) | INTRAVENOUS | Status: DC | PRN

## 2017-03-10 MED ORDER — HYDRALAZINE HCL 20 MG/ML IJ SOLN
10.0000 mg | Freq: Once | INTRAMUSCULAR | Status: AC
  Administered 2017-03-10: 10 mg via INTRAVENOUS

## 2017-03-10 MED ORDER — ZOLPIDEM TARTRATE 5 MG PO TABS: 5.0000 mg | ORAL_TABLET | Freq: Every evening | ORAL | Status: DC | PRN

## 2017-03-10 MED ORDER — OXYCODONE HCL 5 MG PO TABS: 5.0000 mg | ORAL_TABLET | Freq: Once | ORAL | Status: DC | PRN

## 2017-03-10 MED ORDER — OXYCODONE HCL 5 MG PO TABS
ORAL_TABLET | ORAL | Status: AC
  Administered 2017-03-10: 10 mg via ORAL
  Filled 2017-03-10: qty 2

## 2017-03-10 MED ORDER — ONDANSETRON HCL 4 MG/2ML IJ SOLN
INTRAMUSCULAR | Status: DC | PRN
  Administered 2017-03-10: 4 mg via INTRAVENOUS

## 2017-03-10 MED ORDER — HYDROMORPHONE HCL 1 MG/ML IJ SOLN
0.2500 mg | INTRAMUSCULAR | Status: DC | PRN
  Administered 2017-03-10 (×4): 0.5 mg via INTRAVENOUS

## 2017-03-10 MED ORDER — LIDOCAINE HCL (CARDIAC) 20 MG/ML IV SOLN
INTRAVENOUS | Status: DC | PRN
  Administered 2017-03-10: 100 mg via INTRAVENOUS

## 2017-03-10 MED ORDER — PROMETHAZINE HCL 25 MG/ML IJ SOLN
INTRAMUSCULAR | Status: AC
  Filled 2017-03-10: qty 1

## 2017-03-10 MED ORDER — POLYETHYLENE GLYCOL 3350 17 G PO PACK: 17.0000 g | PACK | Freq: Every day | ORAL | Status: DC | PRN

## 2017-03-10 MED ORDER — SUGAMMADEX SODIUM 200 MG/2ML IV SOLN
INTRAVENOUS | Status: DC | PRN
  Administered 2017-03-10: 200 mg via INTRAVENOUS

## 2017-03-10 MED ORDER — ROCURONIUM BROMIDE 10 MG/ML (PF) SYRINGE
PREFILLED_SYRINGE | INTRAVENOUS | Status: AC
  Filled 2017-03-10: qty 5

## 2017-03-10 MED ORDER — SUCCINYLCHOLINE CHLORIDE 200 MG/10ML IV SOSY
PREFILLED_SYRINGE | INTRAVENOUS | Status: AC
  Filled 2017-03-10: qty 10

## 2017-03-10 MED ORDER — PROMETHAZINE HCL 25 MG/ML IJ SOLN
12.5000 mg | Freq: Four times a day (QID) | INTRAMUSCULAR | Status: DC | PRN
  Administered 2017-03-10: 12.5 mg via INTRAVENOUS

## 2017-03-10 MED ORDER — SODIUM CHLORIDE 0.9 % IV SOLN
INTRAVENOUS | Status: DC
  Administered 2017-03-10: 21:00:00 via INTRAVENOUS

## 2017-03-10 MED ORDER — DOCUSATE SODIUM 100 MG PO CAPS: 100.0000 mg | ORAL_CAPSULE | Freq: Two times a day (BID) | ORAL | 0 refills | Status: DC

## 2017-03-10 MED ORDER — ASPIRIN EC 325 MG PO TBEC
325.0000 mg | DELAYED_RELEASE_TABLET | Freq: Two times a day (BID) | ORAL | Status: DC
  Administered 2017-03-11 – 2017-03-12 (×4): 325 mg via ORAL
  Filled 2017-03-10 (×4): qty 1

## 2017-03-10 SURGICAL SUPPLY — 69 items
APL SKNCLS STERI-STRIP NONHPOA (GAUZE/BANDAGES/DRESSINGS) ×1
BANDAGE ACE 4X5 VEL STRL LF (GAUZE/BANDAGES/DRESSINGS) ×1 IMPLANT
BANDAGE ESMARK 6X9 LF (GAUZE/BANDAGES/DRESSINGS) ×1 IMPLANT
BENZOIN TINCTURE PRP APPL 2/3 (GAUZE/BANDAGES/DRESSINGS) ×2 IMPLANT
BLADE SAGITTAL 25.0X1.19X90 (BLADE) ×2 IMPLANT
BLADE SAW SAG 90X13X1.27 (BLADE) ×2 IMPLANT
BLADE SURG 10 STRL SS (BLADE) ×1 IMPLANT
BNDG CMPR 9X6 STRL LF SNTH (GAUZE/BANDAGES/DRESSINGS) ×1
BNDG CMPR MED 10X6 ELC LF (GAUZE/BANDAGES/DRESSINGS) ×1
BNDG ELASTIC 6X10 VLCR STRL LF (GAUZE/BANDAGES/DRESSINGS) ×1 IMPLANT
BNDG ESMARK 6X9 LF (GAUZE/BANDAGES/DRESSINGS) ×2
BOWL SMART MIX CTS (DISPOSABLE) ×2 IMPLANT
CAPT KNEE TOTAL 3 ATTUNE ×1 IMPLANT
CEMENT HV SMART SET (Cement) ×4 IMPLANT
COVER SURGICAL LIGHT HANDLE (MISCELLANEOUS) ×2 IMPLANT
CUFF TOURNIQUET SINGLE 34IN LL (TOURNIQUET CUFF) ×2 IMPLANT
CUFF TOURNIQUET SINGLE 44IN (TOURNIQUET CUFF) IMPLANT
DRAPE EXTREMITY T 121X128X90 (DRAPE) ×2 IMPLANT
DRAPE INCISE IOBAN 66X45 STRL (DRAPES) ×1 IMPLANT
DRAPE U-SHAPE 47X51 STRL (DRAPES) ×2 IMPLANT
DRSG AQUACEL AG ADV 3.5X10 (GAUZE/BANDAGES/DRESSINGS) ×1 IMPLANT
DRSG PAD ABDOMINAL 8X10 ST (GAUZE/BANDAGES/DRESSINGS) ×2 IMPLANT
DURAPREP 26ML APPLICATOR (WOUND CARE) ×2 IMPLANT
ELECT CAUTERY BLADE 6.4 (BLADE) ×1 IMPLANT
ELECT REM PT RETURN 9FT ADLT (ELECTROSURGICAL) ×2
ELECTRODE REM PT RTRN 9FT ADLT (ELECTROSURGICAL) ×1 IMPLANT
EVACUATOR 1/8 PVC DRAIN (DRAIN) IMPLANT
FACESHIELD WRAPAROUND (MASK) ×2 IMPLANT
FACESHIELD WRAPAROUND OR TEAM (MASK) ×1 IMPLANT
GAUZE SPONGE 4X4 12PLY STRL (GAUZE/BANDAGES/DRESSINGS) ×2 IMPLANT
GLOVE BIOGEL PI IND STRL 8 (GLOVE) ×2 IMPLANT
GLOVE BIOGEL PI INDICATOR 8 (GLOVE) ×2
GLOVE ECLIPSE 7.5 STRL STRAW (GLOVE) ×4 IMPLANT
GOWN STRL REUS W/ TWL LRG LVL3 (GOWN DISPOSABLE) ×1 IMPLANT
GOWN STRL REUS W/ TWL XL LVL3 (GOWN DISPOSABLE) ×2 IMPLANT
GOWN STRL REUS W/TWL LRG LVL3 (GOWN DISPOSABLE) ×2
GOWN STRL REUS W/TWL XL LVL3 (GOWN DISPOSABLE) ×4
HANDPIECE INTERPULSE COAX TIP (DISPOSABLE) ×2
HOOD PEEL AWAY FACE SHEILD DIS (HOOD) ×4 IMPLANT
IMMOBILIZER KNEE 22 (SOFTGOODS) ×1 IMPLANT
IMMOBILIZER KNEE 22 UNIV (SOFTGOODS) ×2 IMPLANT
KIT BASIN OR (CUSTOM PROCEDURE TRAY) ×2 IMPLANT
KIT ROOM TURNOVER OR (KITS) ×2 IMPLANT
MANIFOLD NEPTUNE II (INSTRUMENTS) ×2 IMPLANT
NEEDLE 22X1 1/2 (OR ONLY) (NEEDLE) ×3 IMPLANT
NS IRRIG 1000ML POUR BTL (IV SOLUTION) ×2 IMPLANT
PACK TOTAL JOINT (CUSTOM PROCEDURE TRAY) ×2 IMPLANT
PAD ABD 8X10 STRL (GAUZE/BANDAGES/DRESSINGS) ×1 IMPLANT
PAD ARMBOARD 7.5X6 YLW CONV (MISCELLANEOUS) ×4 IMPLANT
PAD CAST 4YDX4 CTTN HI CHSV (CAST SUPPLIES) IMPLANT
PADDING CAST COTTON 4X4 STRL (CAST SUPPLIES) ×2
PADDING CAST COTTON 6X4 STRL (CAST SUPPLIES) ×1 IMPLANT
SET HNDPC FAN SPRY TIP SCT (DISPOSABLE) ×1 IMPLANT
STAPLER VISISTAT 35W (STAPLE) IMPLANT
STRIP CLOSURE SKIN 1/2X4 (GAUZE/BANDAGES/DRESSINGS) IMPLANT
SUCTION FRAZIER HANDLE 10FR (MISCELLANEOUS) ×1
SUCTION TUBE FRAZIER 10FR DISP (MISCELLANEOUS) ×1 IMPLANT
SUT MNCRL AB 3-0 PS2 18 (SUTURE) ×1 IMPLANT
SUT VIC AB 0 CTB1 27 (SUTURE) ×4 IMPLANT
SUT VIC AB 1 CT1 27 (SUTURE) ×4
SUT VIC AB 1 CT1 27XBRD ANBCTR (SUTURE) ×2 IMPLANT
SUT VIC AB 2-0 CTB1 (SUTURE) ×4 IMPLANT
SYR 50ML LL SCALE MARK (SYRINGE) ×2 IMPLANT
SYR CONTROL 10ML LL (SYRINGE) ×2 IMPLANT
TOWEL OR 17X24 6PK STRL BLUE (TOWEL DISPOSABLE) ×2 IMPLANT
TOWEL OR 17X26 10 PK STRL BLUE (TOWEL DISPOSABLE) ×2 IMPLANT
TRAY CATH 16FR W/PLASTIC CATH (SET/KITS/TRAYS/PACK) IMPLANT
TRAY FOLEY W/METER SILVER 16FR (SET/KITS/TRAYS/PACK) IMPLANT
WRAP KNEE MAXI GEL POST OP (GAUZE/BANDAGES/DRESSINGS) ×2 IMPLANT

## 2017-03-10 NOTE — Anesthesia Procedure Notes (Addendum)
Anesthesia Regional Block: Femoral nerve block   Pre-Anesthetic Checklist: ,, timeout performed, Correct Patient, Correct Site, Correct Laterality, Correct Procedure, Correct Position, site marked, Risks and benefits discussed,  Surgical consent,  Pre-op evaluation,  At surgeon's request and post-op pain management  Laterality: Left and Lower  Prep: chloraprep       Needles:  Injection technique: Single-shot  Needle Type: Echogenic Stimulator Needle     Needle Length: 9cm  Needle Gauge: 21   Needle insertion depth: 8 cm   Additional Needles:   Procedures:, nerve stimulator,,, ultrasound used (permanent image in chart),,,,   Nerve Stimulator or Paresthesia:  Response: Twitch elicited, 0.8 mA,   Additional Responses:   Narrative:  Start time: 03/10/2017 1:10 PM End time: 03/10/2017 1:35 PM Injection made incrementally with aspirations every 5 mL.  Performed by: Personally  Anesthesiologist: Sharee HolsterMassagee, Grayden Burley, MD  Additional Notes: BP cuff, EKG monitors applied. Sedation begun. Femoral artery palpated for location of nerve. After nerve location anesthetic injected incrementally, slowly , and after neg aspirations. Tolerated well.

## 2017-03-10 NOTE — Brief Op Note (Signed)
03/10/2017  4:02 PM  PATIENT:  Linward HeadlandMarion D Troupe  56 y.o. female  PRE-OPERATIVE DIAGNOSIS:  OSTEOARTHRITIS LEFT KNEE  POST-OPERATIVE DIAGNOSIS:  OSTEOARTHRITIS LEFT KNEE  PROCEDURE:  Procedure(s): TOTAL KNEE ARTHROPLASTY (Left)  SURGEON:  Surgeon(s) and Role:    Jodi Geralds* Neill Jurewicz, MD - Primary  PHYSICIAN ASSISTANT:   ASSISTANTS: bethune   ANESTHESIA:   general  EBL:  250 mL   BLOOD ADMINISTERED:none  DRAINS: none   LOCAL MEDICATIONS USED:  MARCAINE    and OTHER experel  SPECIMEN:  No Specimen  DISPOSITION OF SPECIMEN:  N/A  COUNTS:  YES  TOURNIQUET:   Total Tourniquet Time Documented: Thigh (Left) - 70 minutes Total: Thigh (Left) - 70 minutes   DICTATION: .Other Dictation: Dictation Number [id number]  PLAN OF CARE: Admit to inpatient   PATIENT DISPOSITION:  PACU - hemodynamically stable.   Delay start of Pharmacological VTE agent (>24hrs) due to surgical blood loss or risk of bleeding: no

## 2017-03-10 NOTE — Anesthesia Preprocedure Evaluation (Addendum)
Anesthesia Evaluation  Patient identified by MRN, date of birth, ID band Patient awake    Reviewed: Allergy & Precautions, NPO status , Patient's Chart, lab work & pertinent test results  History of Anesthesia Complications Negative for: history of anesthetic complications  Airway Mallampati: II  TM Distance: >3 FB Neck ROM: Full    Dental  (+) Partial Upper   Pulmonary neg pulmonary ROS,    breath sounds clear to auscultation       Cardiovascular hypertension, + Past MI and + Peripheral Vascular Disease  + dysrhythmias  Rhythm:Regular Rate:Normal     Neuro/Psych  Headaches,    GI/Hepatic negative GI ROS, Neg liver ROS,   Endo/Other  Morbid obesity  Renal/GU negative Renal ROS     Musculoskeletal   Abdominal (+) + obese,   Peds  Hematology   Anesthesia Other Findings   Reproductive/Obstetrics                            Anesthesia Physical Anesthesia Plan  ASA: III  Anesthesia Plan: General   Post-op Pain Management:  Regional for Post-op pain   Induction: Intravenous  PONV Risk Score and Plan: 4 or greater and Treatment may vary due to age or medical condition, Dexamethasone, Ondansetron and Midazolam  Airway Management Planned: Oral ETT  Additional Equipment:   Intra-op Plan:   Post-operative Plan: Extubation in OR  Informed Consent: I have reviewed the patients History and Physical, chart, labs and discussed the procedure including the risks, benefits and alternatives for the proposed anesthesia with the patient or authorized representative who has indicated his/her understanding and acceptance.   Dental advisory given  Plan Discussed with: CRNA  Anesthesia Plan Comments:        Anesthesia Quick Evaluation

## 2017-03-10 NOTE — Discharge Instructions (Signed)

## 2017-03-10 NOTE — Progress Notes (Signed)
Orthopedic Tech Progress Note Patient Details:  Deborah HeadlandMarion D Williamson Sep 17, 1960 [id number]  CPM Left Knee CPM Left Knee: On Left Knee Flexion (Degrees): 60 Left Knee Extension (Degrees): 0 Additional Comments: applied cpm to pt left knee/leg at 0-60 degress.  pt was very nervous about cpm and we decided 60 degrees to start was better for pt.  pt tolerated application fair.   Post Interventions Patient Tolerated: Fair Instructions Provided: Care of device  Alvina ChouWilliams, Deysy Schabel C 03/10/2017, 5:28 PM

## 2017-03-10 NOTE — Transfer of Care (Signed)
Immediate Anesthesia Transfer of Care Note  Patient: Deborah Williamson  Procedure(s) Performed: TOTAL KNEE ARTHROPLASTY (Left Knee)  Patient Location: PACU  Anesthesia Type:General  Level of Consciousness: awake and alert   Airway & Oxygen Therapy: Patient Spontanous Breathing and Patient connected to nasal cannula oxygen  Post-op Assessment: Report given to RN and Post -op Vital signs reviewed and stable  Post vital signs: Reviewed and stable  Last Vitals:  Vitals:   03/10/17 1335 03/10/17 1340  BP: (!) 171/78 (!) 149/108  Pulse: (!) 57 60  Resp: 16 18  Temp:    SpO2: 100% 100%    Last Pain:  Vitals:   03/10/17 1147  TempSrc: Oral         Complications: No apparent anesthesia complications

## 2017-03-10 NOTE — Telephone Encounter (Signed)
Patient called and wants Doctor Delrae AlfredMulberry to know that today, March 10, 2017 she is having a total [facility]  at 2:00 p.m.  On the right knee she had knee repair on February 05, 2017 procedure called knee arthroscopy.  Pro[facility] done at [facility]  Dr. Luiz BlareGraves performed procedure, not sure if files from his office is coming to our office, is reason patient is calling.

## 2017-03-10 NOTE — Telephone Encounter (Signed)
To Dr. Mulberry FYI 

## 2017-03-10 NOTE — Telephone Encounter (Signed)
I will follow along

## 2017-03-10 NOTE — Anesthesia Procedure Notes (Addendum)
Procedure Name: Intubation Date/Time: 03/10/2017 2:08 PM Performed by: Lanell MatarBaker, Myleen Brailsford M, CRNA Pre-anesthesia Checklist: Patient identified, Emergency Drugs available, Suction available and Patient being monitored Patient Re-evaluated:Patient Re-evaluated prior to induction Oxygen Delivery Method: Circle System Utilized Preoxygenation: Pre-oxygenation with 100% oxygen Induction Type: IV induction Ventilation: Mask ventilation without difficulty Laryngoscope Size: Miller and 2 Grade View: Grade I Tube type: Oral Tube size: 7.5 mm Number of attempts: 1 Airway Equipment and Method: Stylet and Oral airway Placement Confirmation: ETT inserted through vocal cords under direct vision,  positive ETCO2 and breath sounds checked- equal and bilateral Secured at: 21 cm Tube secured with: Tape Dental Injury: Teeth and Oropharynx as per pre-operative assessment

## 2017-03-10 NOTE — H&P (Signed)
TOTAL KNEE ADMISSION H&P  Patient is being admitted for left total knee arthroplasty.  Subjective:  Chief Complaint:left knee pain.  HPI: Deborah Williamson, 56 y.o. female, has a history of pain and functional disability in the left knee due to arthritis and has failed non-surgical conservative treatments for greater than 12 weeks to includeNSAID's and/or analgesics, corticosteriod injections, viscosupplementation injections and activity modification.  Onset of symptoms was gradual, starting 3 years ago with gradually worsening course since that time. The patient noted no past surgery on the left knee(s).  Patient currently rates pain in the left knee(s) at 8 out of 10 with activity. Patient has night pain, worsening of pain with activity and weight bearing, pain that interferes with activities of daily living, pain with passive range of motion, crepitus and joint swelling.  Patient has evidence of subchondral sclerosis, periarticular osteophytes and joint space narrowing by imaging studies. This patient has had Failure of all reasonable conservative care. There is no active infection.  Patient Active Problem List   Diagnosis Date Noted  . Morbid obesity ([place]) 01/02/2017  . DJD (degenerative joint disease) of knee 01/02/2017  . Severe recurrent major depression without psychotic features ([place]) 12/19/2015    Class: Chronic  . Hyperlipidemia 10/12/2015  . Varicose vein   . Left ovarian cyst 02/18/2013  . Ascending aortic aneurysm ([place]) 02/18/2013  . Takotsubo cardiomyopathy 02/18/2013  . NSTEMI (non-ST elevated myocardial infarction) ([place]) 02/16/2013  . Normocytic anemia 02/16/2013  . Depression 02/25/2012  . Intractable headache 12/21/2011  . Hypertension 12/21/2011   Past Medical History:  Diagnosis Date  . Anxiety   . Ascending aortic aneurysm ([place]) 01/2013   3.5-4 cm noted on CT-A  . Bipolar II disorder, most recent episode major depressive ([place])    Followed at [facility].  Ernestine Conrad, counselor:  [phone]  . DJD (degenerative joint disease) of knee 01/02/2017  . Dysrhythmia    "irregular" (02/17/2013)  . Headache(784.0)    "probably once/week" (02/17/2013)  . Hyperlipidemia   . Hypertension   . Insomnia   . Left ovarian cyst 01/2013   Incidental CT finding  . Migraine    "probably once/week" (02/17/2013)  . Morbid obesity ([place]) 01/02/2017  . Normocytic anemia   . NSTEMI (non-ST elevated myocardial infarction) ([place]) 02/16/2013   Takotsubo cardiomyopathy, normal cors  . Obesity   . OCD (obsessive compulsive disorder)   . Plantar fasciitis of right foot   . PTSD (post-traumatic stress disorder)   . PTSD (post-traumatic stress disorder)   . Takotsubo cardiomyopathy 01/2013   a. 2D echo 02/17/13: EF 30-35%, periapical AK, normal RV size/function, moderate pulmonary HTN. c/w Takotsubo CM. ;  b.  Echo (04/2013): EF 55%, no WMA, Gr 1 DD, mildly dilated ascending aorta (39 mm), mild MR, mild BAE, PASP 33  . Trichomonas vaginitis 10/2014    Past Surgical History:  Procedure Laterality Date  . BRACHIOPLASTY Bilateral 05/2011   Plastic Surgery to remove loose skin after Weight Loss  . CARDIAC CATHETERIZATION  02/17/2013   no angiographic evidence of CAD, EF 35%, HK or the anteroapical wall, apex and inferoapical wall  . FOOT SURGERY Right   . KNEE ARTHROSCOPY Right 02/05/2017   [facility]  . LEFT HEART CATHETERIZATION WITH CORONARY ANGIOGRAM N/A 02/17/2013   Procedure: LEFT HEART CATHETERIZATION WITH CORONARY ANGIOGRAM;  Surgeon: Burnell Blanks, MD;  Location: [facility] CATH LAB;  Service: Cardiovascular;  Laterality: N/A;  . MASS EXCISION Right 02/29/2016   Procedure: EXCISION MASS,  right wrist;  Surgeon: Leanora Cover, MD;  Location: [place];  Service: Orthopedics;  Laterality: Right;  EXCISION MASS, right wrist  . TUBAL LIGATION  1989    Current Facility-Administered Medications  Medication Dose Route Frequency Provider Last  Rate Last Dose  . ceFAZolin (ANCEF) IVPB 2g/100 mL premix  2 g Intravenous To Nino Parsley, MD      . chlorhexidine (HIBICLENS) 4 % liquid 4 application  60 mL Topical Once Dorna Leitz, MD      . fentaNYL (SUBLIMAZE) 100 MCG/2ML injection           . lactated ringers infusion   Intravenous Continuous Massagee, Coralyn Mark, MD      . midazolam (VERSED) 2 MG/2ML injection            Allergies  Allergen Reactions  . Lisinopril Other (See Comments)    Dry cough  . Vicodin [Hydrocodone-Acetaminophen] Nausea Only    Social History   Tobacco Use  . Smoking status: Never Smoker  . Smokeless tobacco: Never Used  Substance Use Topics  . Alcohol use: No    Family History  Problem Relation Age of Onset  . Hypertension Mother   . Breast cancer Mother   . Depression Sister   . Diabetes Paternal Grandmother   . Stroke Paternal Grandmother   . Breast cancer Maternal Aunt   . Prostate cancer Maternal Uncle   . Prostate cancer Maternal Uncle      ROS ROS: I have reviewed the patient's review of systems thoroughly and there are no positive responses as relates to the HPI.Objective:  Physical Exam  Vital signs in last 24 hours: Temp:  [98.3 F (36.8 C)] 98.3 F (36.8 C) (12/17 1147) Pulse Rate:  [61] 61 (12/17 1147) Resp:  [19] 19 (12/17 1147) BP: (157)/(76) 157/76 (12/17 1147) SpO2:  [100 %] 100 % (12/17 1147) Well-developed well-nourished patient in no acute distress. Alert and oriented x3 HEENT:within normal limits Cardiac: Regular rate and rhythm Pulmonary: Lungs clear to auscultation Abdomen: Soft and nontender.  Normal active bowel sounds  Musculoskeletal: (Left knee: Painful range of motion.  Limited range of motion.  No instability.  Trace effusion.  Neurovascularly intact distally.) Labs: Recent Results (from the past 2160 hour(s))  Surgical pcr screen     Status: None   Collection Time: 03/04/17  8:42 AM  Result Value Ref Range   MRSA, PCR NEGATIVE NEGATIVE    Staphylococcus aureus NEGATIVE NEGATIVE    Comment: (NOTE) The Xpert SA Assay (FDA approved for NASAL specimens in patients 39 years of age and older), is one component of a comprehensive surveillance program. It is not intended to diagnose infection nor to guide or monitor treatment.   APTT     Status: None   Collection Time: 03/04/17  8:43 AM  Result Value Ref Range   aPTT 31 24 - 36 seconds  CBC WITH DIFFERENTIAL     Status: Abnormal   Collection Time: 03/04/17  8:43 AM  Result Value Ref Range   WBC 4.1 4.0 - 10.5 K/uL   RBC 4.40 3.87 - 5.11 MIL/uL   Hemoglobin 11.4 (L) 12.0 - 15.0 g/dL   HCT 35.8 (L) 36.0 - 46.0 %   MCV 81.4 78.0 - 100.0 fL   MCH 25.9 (L) 26.0 - 34.0 pg   MCHC 31.8 30.0 - 36.0 g/dL   RDW 14.1 11.5 - 15.5 %   Platelets 270 150 - 400 K/uL   Neutrophils Relative %  48 %   Neutro Abs 2.0 1.7 - 7.7 K/uL   Lymphocytes Relative 44 %   Lymphs Abs 1.8 0.7 - 4.0 K/uL   Monocytes Relative 5 %   Monocytes Absolute 0.2 0.1 - 1.0 K/uL   Eosinophils Relative 3 %   Eosinophils Absolute 0.1 0.0 - 0.7 K/uL   Basophils Relative 0 %   Basophils Absolute 0.0 0.0 - 0.1 K/uL  Comprehensive metabolic panel     Status: Abnormal   Collection Time: 03/04/17  8:43 AM  Result Value Ref Range   Sodium 138 135 - 145 mmol/L   Potassium 3.8 3.5 - 5.1 mmol/L   Chloride 107 101 - 111 mmol/L   CO2 26 22 - 32 mmol/L   Glucose, Bld 93 65 - 99 mg/dL   BUN 14 6 - 20 mg/dL   Creatinine, Ser 0.78 0.44 - 1.00 mg/dL   Calcium 8.9 8.9 - 10.3 mg/dL   Total Protein 7.1 6.5 - 8.1 g/dL   Albumin 3.4 (L) 3.5 - 5.0 g/dL   AST 18 15 - 41 U/L   ALT 12 (L) 14 - 54 U/L   Alkaline Phosphatase 64 38 - 126 U/L   Total Bilirubin 0.8 0.3 - 1.2 mg/dL   GFR calc non Af Amer >60 >60 mL/min   GFR calc Af Amer >60 >60 mL/min    Comment: (NOTE) The eGFR has been calculated using the CKD EPI equation. This calculation has not been validated in all clinical situations. eGFR's persistently <60 mL/min signify  possible Chronic Kidney Disease.    Anion gap 5 5 - 15  Protime-INR     Status: None   Collection Time: 03/04/17  8:43 AM  Result Value Ref Range   Prothrombin Time 14.1 11.4 - 15.2 seconds   INR 1.10   Urinalysis, Routine w reflex microscopic     Status: Abnormal   Collection Time: 03/04/17  8:43 AM  Result Value Ref Range   Color, Urine YELLOW YELLOW   APPearance HAZY (A) CLEAR   Specific Gravity, Urine 1.020 1.005 - 1.030   pH 6.0 5.0 - 8.0   Glucose, UA NEGATIVE NEGATIVE mg/dL   Hgb urine dipstick SMALL (A) NEGATIVE   Bilirubin Urine NEGATIVE NEGATIVE   Ketones, ur NEGATIVE NEGATIVE mg/dL   Protein, ur NEGATIVE NEGATIVE mg/dL   Nitrite NEGATIVE NEGATIVE   Leukocytes, UA NEGATIVE NEGATIVE   RBC / HPF 0-5 0 - 5 RBC/hpf   WBC, UA 0-5 0 - 5 WBC/hpf   Bacteria, UA RARE (A) NONE SEEN   Squamous Epithelial / LPF 0-5 (A) NONE SEEN   Mucus PRESENT   hCG, serum, qualitative     Status: None   Collection Time: 03/04/17  8:43 AM  Result Value Ref Range   Preg, Serum NEGATIVE NEGATIVE    Comment:        THE SENSITIVITY OF THIS METHODOLOGY IS >10 mIU/mL.   Type and screen Order type and screen if day of surgery is less than 15 days from draw of preadmission visit or order morning of surgery if day of surgery is greater than 6 days from preadmission visit.     Status: None   Collection Time: 03/04/17  8:55 AM  Result Value Ref Range   ABO/RH(D) A POS    Antibody Screen NEG    Sample Expiration 03/18/2017    Extend sample reason NO TRANSFUSIONS OR PREGNANCY IN THE PAST 3 MONTHS   ABO/Rh     Status: None  Collection Time: 03/04/17  8:55 AM  Result Value Ref Range   ABO/RH(D) A POS     Estimated body mass index is 40.49 kg/m as calculated from the following:   Height as of 03/04/17: 5' 6" (1.676 m).   Weight as of 03/04/17: 113.8 kg (250 lb 14.1 oz).   Imaging Review Plain radiographs demonstrate severe degenerative joint disease of the left knee(s). The overall alignment  ismild varus. The bone quality appears to be fair for age and reported activity level.  Assessment/Plan:  End stage arthritis, left knee   The patient history, physical examination, clinical judgment of the provider and imaging studies are consistent with end stage degenerative joint disease of the left knee(s) and total knee arthroplasty is deemed medically necessary. The treatment options including medical management, injection therapy arthroscopy and arthroplasty were discussed at length. The risks and benefits of total knee arthroplasty were presented and reviewed. The risks due to aseptic loosening, infection, stiffness, patella tracking problems, thromboembolic complications and other imponderables were discussed. The patient acknowledged the explanation, agreed to proceed with the plan and consent was signed. Patient is being admitted for inpatient treatment for surgery, pain control, PT, OT, prophylactic antibiotics, VTE prophylaxis, progressive ambulation and ADL's and discharge planning. The patient is planning to be discharged home with home health services

## 2017-03-11 ENCOUNTER — Encounter (HOSPITAL_COMMUNITY): Payer: Self-pay | Admitting: Orthopedic Surgery

## 2017-03-11 LAB — BASIC METABOLIC PANEL
ANION GAP: 9 (ref 5–15)
BUN: 13 mg/dL (ref 6–20)
CALCIUM: 8.7 mg/dL — AB (ref 8.9–10.3)
CO2: 24 mmol/L (ref 22–32)
Chloride: 103 mmol/L (ref 101–111)
Creatinine, Ser: 0.75 mg/dL (ref 0.44–1.00)
GFR calc Af Amer: >60 mL/min (ref >60)
GFR calc non Af Amer: >60 mL/min (ref >60)
GLUCOSE: 143 mg/dL — AB (ref 65–99)
Potassium: 4.2 mmol/L (ref 3.5–5.1)
Sodium: 136 mmol/L (ref 135–145)

## 2017-03-11 LAB — CBC
HEMATOCRIT: 31.4 % — AB (ref 36.0–46.0)
Hemoglobin: 10 g/dL — ABNORMAL LOW (ref 12.0–15.0)
MCH: 26.1 pg (ref 26.0–34.0)
MCHC: 31.8 g/dL (ref 30.0–36.0)
MCV: 82 fL (ref 78.0–100.0)
Platelets: 235 10*3/uL (ref 150–400)
RBC: 3.83 MIL/uL — ABNORMAL LOW (ref 3.87–5.11)
RDW: 14.5 % (ref 11.5–15.5)
WBC: 10.6 10*3/uL — ABNORMAL HIGH (ref 4.0–10.5)

## 2017-03-11 NOTE — Op Note (Signed)
NAME:  Deborah Williamson BioMARION             ACCOUNT NO.:  [id number]  MEDICAL RECORD NO.:  [id number]  LOCATION:                                 FACILITY:  PHYSICIAN:  Harvie JuniorJohn L. Joshawa Williamson, M.D.        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  PROCEDURE:  Left total knee replacement with Attune system size 5 femur, size 5 tibia, 6-mm bridging bearing, and a 38-mm all polyethylene patella.  SURGEON:  Harvie JuniorJohn L. Ardel Jagger, MD.  Deborah HeadsASSISTANOrma Williamson:  Deborah Williamson.  ANESTHESIA:  General.  BRIEF HISTORY:  Ms. Deborah Williamson is a 56 year old female with long history and significant complaints of left knee pain.  She had been treated conservatively for prolonged period of time.  After failure of all conservative care, she was taken to the operating room for left total knee replacement.  She was having night pain and light activity pain.  X- ray showed bone-on-bone change.  DESCRIPTION OF PROCEDURE:  The patient was brought to the operative room and after adequate anesthesia was obtained with general anesthetic.  The patient was placed supine on the operating table.  Left leg was prepped and draped in the usual sterile fashion.  Following this, the leg was exsanguinated.  Blood pressure tourniquet was inflated to 350 mmHg. Following this, a midline incision was made.  Subcutaneous tissue was dissected down to the level of the extensor mechanism.  A medial parapatellar arthrotomy was undertaken.  Following this, attention was turned towards removal of retropatellar fat pad, synovium on the anterior aspect of the femur, medial and lateral meniscus, and the anterior and posterior cruciates.  Once this was done, attention was turned to the femur where  an intramedullary pilot hole was drilled and a 4-degree valgus inclination cut was made and 19 mm of distal bone resected.  Following this, the attention was turned towards the sizing of the femur, sized to a 5.  Anterior and posterior cuts were made, chamfers and box.  Attention was then turned to the tibia, which was perpendicular to its long axis.  It was then drilled and keeled and a size 5 tibia was chosen.  The trials were put in place with a size 5 poly.  Attention was turned to the patella, cut down to a level of 13 mm and a 38 paddle was chosen and following this, the lugs were drilled. The attention was turned towards placement of the trial components, size 5 femur, size 5 tibia, and a 38 all-poly patella placed through where the 38 paddle was chosen and then failed.  Following this, knee put through a range of motion.  Excellent stability was achieved.  Trial components were all removed.  Posterior osteophytes were removed and at this point, the knee was copiously and thoroughly lavaged with pulsatile lavage irrigation and the final components were then cemented into place, size 5 tibia, size 5 femur, 5 mm bridging bearing, and a 38 all- poly patella was placed  and held with a clamp.  All excess bone cement was removed.  Then, the cement was allowed to completely harden.  Once that was completed, I trialed a 6 poly.  I liked that little bit better, so a 6 poly was placed and following this, the knee was copiously irrigated and suctioned dry.  All bleeding was controlled with electrocautery after the tourniquet had been let down and then the size 6 poly was opened and placed and the medial parapatellar arthrotomy was closed with 1 Vicryl running and the skin was closed with 0 and 2-0 Vicryl and skin with staples because of her morbid obesity.  At this point, a sterile compressive dressing was applied.  The patient was taken to the recovery room where she was noted to be in  satisfactory condition.  Estimated blood loss for procedure was minimal.     Harvie JuniorJohn L. Der Williamson, M.D.   ______________________________ Harvie JuniorJohn L. Angelino Williamson, M.D.    Deborah PlumberJLG/MEDQ  D:  03/10/2017  T:  03/10/2017  Job:  [id number]

## 2017-03-11 NOTE — Evaluation (Signed)
Physical Therapy Evaluation Patient Details Name: Deborah HeadlandMarion D Guajardo MRN: [id number] DOB: May 31, 1960 Today's Date: 03/11/2017   History of Present Illness  Admitted for L TKA, WBAT, KI with amb; Recent R knee scope as well, still somewhat painful;  has a pertinent past medical history of Anxiety, Ascending aortic aneurysm ([facility]) (01/2013), Bipolar II disorder, most recent episode major depressive ([facility]), DJD (degenerative joint disease) of knee (01/02/2017), Dysrhythmia,Normocytic anemia, NSTEMI (non-ST elevated myocardial infarction) ([facility]) (02/16/2013), Obesity, OCD (obsessive compulsive disorder),PTSD (post-traumatic stress disorder),  Takotsubo cardiomyopathy (01/2013)  Clinical Impression   Pt is s/p TKA resulting in the deficits listed below (see PT Problem List). Independent prior to admission, though reports still slightly painful R knee from recent scope; Currently requiring moderate assist for transfers, and pain limited gait distance this session; Tends to be internally distracted by pain and emotional; provided encouragement and support;  Pt will benefit from skilled PT to increase their independence and safety with mobility to allow discharge to the venue listed below.      Follow Up Recommendations DC plan and follow up therapy as arranged by surgeon;Supervision/Assistance - 24 hour    Equipment Recommendations  Rolling walker with 5" wheels;3in1 (PT)(wide)    Recommendations for Other Services OT consult(Ordered per protocol)     Precautions / Restrictions Precautions Precautions: Knee;Fall Precaution Booklet Issued: Yes (comment) Precaution Comments: Pt educated to not allow any pillow or bolster under knee for healing with optimal range of motion. Internally distracted by pain; will need reinforcement Required Braces or Orthoses: Knee Immobilizer - Left Knee Immobilizer - Left: On when out of bed or walking Restrictions LLE Weight Bearing: Weight bearing as tolerated       Mobility  Bed Mobility Overal bed mobility: Needs Assistance Bed Mobility: Supine to Sit     Supine to sit: Min assist     General bed mobility comments: Cues for technqiue; min assist to support LLE coming off of the bed   Transfers Overall transfer level: Needs assistance Equipment used: Rolling walker (2 wheeled) Transfers: Sit to/from Stand Sit to Stand: Mod assist         General transfer comment: Light mod assist to steady during power up; took incr time to organize UEs to push up and initiate with anterior weight shift; dependent on momentum for rise; Sat impulsively, and slightly prematurely to chair despite cues  Ambulation/Gait Ambulation/Gait assistance: Min assist Ambulation Distance (Feet): (small pivot steps bed to chair) Assistive device: Rolling walker (2 wheeled) Gait Pattern/deviations: Shuffle;Decreased stance time - left     General Gait Details: Decr tolerance of standing, likely due to pain; internally distracted by pain and sat prematurely to recliner; difficult for her to pay attention to instructions  Stairs            Wheelchair Mobility    Modified Rankin (Stroke Patients Only)       Balance Overall balance assessment: Needs assistance Sitting-balance support: Bilateral upper extremity supported Sitting balance-Leahy Scale: Fair       Standing balance-Leahy Scale: Poor                               Pertinent Vitals/Pain Pain Assessment: 0-10 Pain Score: 8  Pain Location: L knee Pain Descriptors / Indicators: Aching;Constant;Discomfort;Grimacing;Guarding;Crying Pain Intervention(s): Monitored during session;Premedicated before session;Repositioned    Home Living Family/patient expects to be discharged to:: Private residence(Mother's home) Living Arrangements: Parent Available Help at Discharge: Family;Available PRN/intermittently(likely  near 24 hours) Type of Home: House Home Access: Stairs to  enter Entrance Stairs-Rails: Left Entrance Stairs-Number of Steps: 5 Home Layout: One level Home Equipment: Walker - 2 wheels(borrowing mother's) Additional Comments: Pt tells me she would really like to dc to her own home, however will likely not have assist in her own home; She is willing to go to her mother's home (it sounds like she must confirm that with her mother); she went to her daughter's home after R knee scope and doesn't want to do that agian.    Prior Function Level of Independence: Independent               Hand Dominance        Extremity/Trunk Assessment   Upper Extremity Assessment Upper Extremity Assessment: Generalized weakness    Lower Extremity Assessment Lower Extremity Assessment: LLE deficits/detail LLE Deficits / Details: Grossly decr AROM and strength, limited by pain postop; able to tolerate flexion to approx 40deg; minimal quad activation in bed, better with standing (still used KI for knee stability, and per orders) LLE: Unable to fully assess due to pain       Communication   Communication: No difficulties  Cognition Arousal/Alertness: Awake/alert Behavior During Therapy: WFL for tasks assessed/performed Overall Cognitive Status: Within Functional Limits for tasks assessed                                 General Comments: Though internally sidstracted by pain and nausea      General Comments      Exercises     Assessment/Plan    PT Assessment Patient needs continued PT services  PT Problem List Decreased strength;Decreased range of motion;Decreased activity tolerance;Decreased balance;Decreased mobility;Decreased coordination;Decreased cognition;Decreased knowledge of use of DME;Decreased safety awareness;Decreased knowledge of precautions;Pain       PT Treatment Interventions DME instruction;Gait training;Stair training;Functional mobility training;Therapeutic activities;Therapeutic exercise;Balance  training;Patient/family education    PT Goals (Current goals can be found in the Care Plan section)  Acute Rehab PT Goals Patient Stated Goal: Did not state, but clearly doesn't want to hurt PT Goal Formulation: With patient Time For Goal Achievement: 03/25/17 Potential to Achieve Goals: Good    Frequency 7X/week   Barriers to discharge        Co-evaluation               AM-PAC PT "6 Clicks" Daily Activity  Outcome Measure Difficulty turning over in bed (including adjusting bedclothes, sheets and blankets)?: A Lot Difficulty moving from lying on back to sitting on the side of the bed? : A Little Difficulty sitting down on and standing up from a chair with arms (e.g., wheelchair, bedside commode, etc,.)?: A Lot Help needed moving to and from a bed to chair (including a wheelchair)?: A Lot Help needed walking in hospital room?: A Lot Help needed climbing 3-5 steps with a railing? : Total 6 Click Score: 12    End of Session Equipment Utilized During Treatment: Gait belt;Left knee immobilizer Activity Tolerance: Patient limited by pain Patient left: in chair;with call bell/phone within reach Nurse Communication: Mobility status PT Visit Diagnosis: Unsteadiness on feet (R26.81);Other abnormalities of gait and mobility (R26.89);Pain Pain - Right/Left: Left Pain - part of body: Knee    Time: 4098-1191 PT Time Calculation (min) (ACUTE ONLY): 30 min   Charges:   PT Evaluation $PT Eval Moderate Complexity: 1 Mod PT Treatments $Therapeutic Activity: 8-22 mins  PT G Codes:        Erikka Follmer, PT  [facility] Pager [phone] Office [phone]   Levi AlandHolly H Naydeen Speirs 03/11/2017, 9:35 AM

## 2017-03-11 NOTE — Progress Notes (Signed)
Subjective: 1 Day Post-Op Procedure(s) (LRB): TOTAL KNEE ARTHROPLASTY (Left) Patient reports pain as moderate.  Taking by mouth. Foley out this morning.  Some nausea.  Relieved with Zofran.  Objective: Vital signs in last 24 hours: Temp:  [97 F (36.1 C)-98.6 F (37 C)] 98 F (36.7 C) (12/18 0403) Pulse Rate:  [50-64] 61 (12/18 0403) Resp:  [10-19] 18 (12/18 0403) BP: (97-200)/(53-108) 103/53 (12/18 0403) SpO2:  [96 %-100 %] 96 % (12/18 0403)  Intake/Output from previous day: 12/17 0701 - 12/18 0700 In: 2100 [P.O.:200; I.V.:1500; IV Piggyback:100] Out: 1500 [Urine:1250; Blood:250] Intake/Output this shift: Total I/O In: -  Out: 600 [Urine:600]  Recent Labs    03/11/17 0656  HGB 10.0*   Recent Labs    03/11/17 0656  WBC 10.6*  RBC 3.83*  HCT 31.4*  PLT 235   Recent Labs    03/11/17 0656  NA 136  K 4.2  CL 103  CO2 24  BUN 13  CREATININE 0.75  GLUCOSE 143*  CALCIUM 8.7*   No results for input(s): LABPT, INR in the last 72 hours. Left knee exam: Neurovascular intact Sensation intact distally Intact pulses distally Dorsiflexion/Plantar flexion intact Incision: dressing C/D/I Compartment soft  Assessment/Plan: 1 Day Post-Op Procedure(s) (LRB): TOTAL KNEE ARTHROPLASTY (Left)   Plan: Aspirin 325 mg twice daily for DVT prophylaxis 1 month. Weight-bear as tolerated on the left. Up with therapy Plan for discharge tomorrow  Danahi Reddish G 03/11/2017, 9:12 AM

## 2017-03-11 NOTE — Progress Notes (Signed)
Physical Therapy Treatment Patient Details Name: Deborah HeadlandMarion D Cipollone MRN: [id number] DOB: 1960/09/17 Today's Date: 03/11/2017    History of Present Illness Admitted for L TKA, WBAT, KI with amb; Recent R knee scope as well, still somewhat painful;  has a pertinent past medical history of Anxiety, Ascending aortic aneurysm (HCC) (01/2013), Bipolar II disorder, most recent episode major depressive (HCC), DJD (degenerative joint disease) of knee (01/02/2017), Dysrhythmia,Normocytic anemia, NSTEMI (non-ST elevated myocardial infarction) (HCC) (02/16/2013), Obesity, OCD (obsessive compulsive disorder),PTSD (post-traumatic stress disorder),  Takotsubo cardiomyopathy (01/2013)    PT Comments    Continuing work on functional mobility and activity tolerance;  Session focused on trying to incr gait distance, and Ms. Lin GivensJeffries was able to walk in hallway a short distance; limited by pain and nausea   Follow Up Recommendations  DC plan and follow up therapy as arranged by surgeon;Supervision/Assistance - 24 hour     Equipment Recommendations  Rolling walker with 5" wheels;3in1 (PT)(wide)    Recommendations for Other Services       Precautions / Restrictions Precautions Precautions: Knee;Fall Precaution Booklet Issued: Yes (comment) Precaution Comments: Pt educated to not allow any pillow or bolster under knee for healing with optimal range of motion.  Required Braces or Orthoses: Knee Immobilizer - Left Knee Immobilizer - Left: On when out of bed or walking Restrictions LLE Weight Bearing: Weight bearing as tolerated    Mobility  Bed Mobility Overal bed mobility: Needs Assistance Bed Mobility: Supine to Sit;Sit to Supine     Supine to sit: Min assist Sit to supine: Min guard   General bed mobility comments: Cues for technqiue; min assist to support LLE coming off of the bed   Transfers Overall transfer level: Needs assistance Equipment used: Rolling walker (2 wheeled) Transfers: Sit  to/from Stand Sit to Stand: Mod assist;+2 physical assistance         General transfer comment: Light mod assist to steady during power up; took incr time to organize UEs to push up and initiate with anterior weight shift; dependent on momentum for rise  Ambulation/Gait Ambulation/Gait assistance: Min assist;+2 safety/equipment(chair push) Ambulation Distance (Feet): 18 Feet Assistive device: Rolling walker (2 wheeled) Gait Pattern/deviations: Decreased stance time - left     General Gait Details: Cues for gait sequence and to support self on RW; recliner pushed behind for safety   Stairs            Wheelchair Mobility    Modified Rankin (Stroke Patients Only)       Balance     Sitting balance-Leahy Scale: Fair       Standing balance-Leahy Scale: Poor                              Cognition Arousal/Alertness: Awake/alert Behavior During Therapy: WFL for tasks assessed/performed Overall Cognitive Status: Within Functional Limits for tasks assessed                                 General Comments: Though internally distracted by pain and nausea      Exercises      General Comments        Pertinent Vitals/Pain Pain Assessment: Faces Faces Pain Scale: Hurts whole lot Pain Location: L knee Pain Descriptors / Indicators: Aching;Constant;Discomfort;Grimacing;Guarding;Crying Pain Intervention(s): Monitored during session;Premedicated before session    Home Living  Prior Function            PT Goals (current goals can now be found in the care plan section) Acute Rehab PT Goals Patient Stated Goal: Did not state, but clearly doesn't want to hurt PT Goal Formulation: With patient Time For Goal Achievement: 03/25/17 Potential to Achieve Goals: Good Progress towards PT goals: Progressing toward goals    Frequency    7X/week      PT Plan Current plan remains appropriate    Co-evaluation               AM-PAC PT "6 Clicks" Daily Activity  Outcome Measure  Difficulty turning over in bed (including adjusting bedclothes, sheets and blankets)?: A Lot Difficulty moving from lying on back to sitting on the side of the bed? : A Little Difficulty sitting down on and standing up from a chair with arms (e.g., wheelchair, bedside commode, etc,.)?: A Lot Help needed moving to and from a bed to chair (including a wheelchair)?: A Little Help needed walking in hospital room?: A Little Help needed climbing 3-5 steps with a railing? : A Lot 6 Click Score: 15    End of Session Equipment Utilized During Treatment: Gait belt;Left knee immobilizer Activity Tolerance: Patient limited by pain Patient left: in bed;with call bell/phone within reach Nurse Communication: Mobility status PT Visit Diagnosis: Unsteadiness on feet (R26.81);Other abnormalities of gait and mobility (R26.89);Pain Pain - Right/Left: Left Pain - part of body: Knee     Time: 1434-1450 PT Time Calculation (min) (ACUTE ONLY): 16 min  Charges:  $Gait Training: 8-22 mins                    G Codes:       Van ClinesHolly Jaina Morin, PT  Acute Rehabilitation Services Pager [phone] Office [phone]    Levi AlandHolly H Brevyn Ring 03/11/2017, 4:09 PM

## 2017-03-12 ENCOUNTER — Encounter (HOSPITAL_COMMUNITY): Payer: Self-pay | Admitting: General Practice

## 2017-03-12 ENCOUNTER — Other Ambulatory Visit: Payer: Self-pay

## 2017-03-12 LAB — CBC
HEMATOCRIT: 28.3 % — AB (ref 36.0–46.0)
HEMOGLOBIN: 9.1 g/dL — AB (ref 12.0–15.0)
MCH: 26.4 pg (ref 26.0–34.0)
MCHC: 32.2 g/dL (ref 30.0–36.0)
MCV: 82 fL (ref 78.0–100.0)
Platelets: 197 10*3/uL (ref 150–400)
RBC: 3.45 MIL/uL — AB (ref 3.87–5.11)
RDW: 14.5 % (ref 11.5–15.5)
WBC: 14.5 10*3/uL — ABNORMAL HIGH (ref 4.0–10.5)

## 2017-03-12 NOTE — Discharge Summary (Signed)
Patient ID: Deborah Williamson Touchet MRN: [id number] DOB/AGE: Aug 01, 1960 56 y.o.  Admit date: 03/10/2017 Discharge date: 03/12/2017  Admission Diagnoses:  Principal Problem:   Primary osteoarthritis of left knee   Discharge Diagnoses:  Same  Past Medical History:  Diagnosis Date  . Anxiety   . Ascending aortic aneurysm (HCC) 01/2013   3.5-4 cm noted on CT-A  . Bipolar II disorder, most recent episode major depressive (HCC)    [facility]  Cline CrockKaren Garrepeta, counselor:  [phone]  . DJD (degenerative joint disease) of knee 01/02/2017  . Dysrhythmia    "irregular" (02/17/2013)  . Headache(784.0)    "probably once/week" (02/17/2013)  . Hyperlipidemia   . Hypertension   . Insomnia   . Left ovarian cyst 01/2013   Incidental CT finding  . Migraine    "probably once/week" (02/17/2013)  . Morbid obesity (HCC) 01/02/2017  . Normocytic anemia   . NSTEMI (non-ST elevated myocardial infarction) (HCC) 02/16/2013   Takotsubo cardiomyopathy, normal cors  . Obesity   . OCD (obsessive compulsive disorder)   . Plantar fasciitis of right foot   . PTSD (post-traumatic stress disorder)   . PTSD (post-traumatic stress disorder)   . Takotsubo cardiomyopathy 01/2013   a. 2D echo 02/17/13: EF 30-35%, periapical AK, normal RV size/function, moderate pulmonary HTN. c/w Takotsubo CM. ;  b.  Echo (04/2013): EF 55%, no WMA, Gr 1 DD, mildly dilated ascending aorta (39 mm), mild MR, mild BAE, PASP 33  . Trichomonas vaginitis 10/2014    Surgeries: Procedure(s): Left TOTAL KNEE ARTHROPLASTY on 03/10/2017   Consultants:   Discharged Condition: Improved  Hospital Course: Deborah Williamson Cherne is an 56 y.o. female who was admitted 03/10/2017 for operative treatment ofPrimary osteoarthritis of left knee. Patient has severe unremitting pain that affects sleep, daily activities, and work/hobbies. After pre-op clearance the patient was taken to the operating room on 03/10/2017 and underwent  Procedure(s):  Left TOTAL KNEE ARTHROPLASTY.    Patient was given perioperative antibiotics:  Anti-infectives (From admission, onward)   Start     Dose/Rate Route Frequency Ordered Stop   03/10/17 2000  ceFAZolin (ANCEF) IVPB 2g/100 mL premix     2 g 200 mL/hr over 30 Minutes Intravenous Every 6 hours 03/10/17 1859 03/11/17 0217   03/10/17 1400  ceFAZolin (ANCEF) IVPB 2g/100 mL premix     2 g 200 mL/hr over 30 Minutes [facility]venous To [facility] 03/07/17 1838 03/10/17 1415       Patient was given sequential compression devices, early ambulation, and chemoprophylaxis to prevent DVT.  Patient benefited maximally from hospital stay and there were no complications.    Recent vital signs:  Patient Vitals for the past 24 hrs:  BP Temp Temp src Pulse Resp SpO2  03/12/17 0602 126/76 98.4 F (36.9 C) Oral 73 16 100 %  03/11/17 2155 (!) 99/56 99.1 F (37.3 C) Oral 77 16 98 %  03/11/17 1403 100/61 99 F (37.2 C) Oral 65 17 98 %     Recent laboratory studies:  Recent Labs    03/11/17 0656 03/12/17 0543  WBC 10.6* 14.5*  HGB 10.0* 9.1*  HCT 31.4* 28.3*  PLT 235 197  NA 136  --   K 4.2  --   CL 103  --   CO2 24  --   BUN 13  --   CREATININE 0.75  --   GLUCOSE 143*  --   CALCIUM 8.7*  --      Discharge Medications:   Allergies  as of 03/12/2017      Reactions   Lisinopril Other (See Comments)   Dry cough   Vicodin [hydrocodone-acetaminophen] Nausea Only      Medication List    STOP taking these medications   HYDROcodone-acetaminophen 5-325 MG tablet Commonly known as:  NORCO/VICODIN     TAKE these medications   acetaminophen 500 MG tablet Commonly known as:  TYLENOL Take 1,000 mg by mouth 2 (two) times daily as needed for moderate pain or headache.   aspirin EC 325 MG tablet Take 1 tablet (325 mg total) by mouth 2 (two) times daily after a meal. Take x 1 month post op to decrease risk of blood clots. What changed:    medication strength  how much to take  when to  take this  additional instructions   carvedilol 6.25 MG tablet Commonly known as:  COREG TAKE 1 TABLET BY MOUTH TWICE A DAY WITH A MEAL   docusate sodium 100 MG capsule Commonly known as:  COLACE Take 1 capsule (100 mg total) by mouth 2 (two) times daily.   HAIR SKIN AND NAILS FORMULA Tabs Take 1 tablet by mouth daily.   losartan 100 MG tablet Commonly known as:  COZAAR TAKE 1 TABLET (100 MG TOTAL) BY MOUTH DAILY.   Melatonin 5 MG Tabs Take 15 mg by mouth at bedtime as needed (sleep).   multivitamin with minerals Tabs tablet Take 1 tablet by mouth daily.   ondansetron 4 MG tablet Commonly known as:  ZOFRAN Take 4 mg by mouth daily as needed for nausea or vomiting.   oxyCODONE-acetaminophen 5-325 MG tablet Commonly known as:  PERCOCET/ROXICET Take 1-2 tablets by mouth every 6 (six) hours as needed for severe pain.   rosuvastatin 10 MG tablet Commonly known as:  CRESTOR TAKE 1 TABLET BY MOUTH DAILY   tiZANidine 2 MG tablet Commonly known as:  ZANAFLEX Take 1 tablet (2 mg total) by mouth every 8 (eight) hours as needed for muscle spasms.   traZODone 100 MG tablet Commonly known as:  DESYREL Take 100 mg by mouth at bedtime as needed for sleep.            Discharge Care Instructions  (From admission, onward)        Start     Ordered   03/12/17 0000  Weight bearing as tolerated    Question Answer Comment  Laterality left   Extremity Lower      03/12/17 0822      Diagnostic Studies: Dg Chest 2 View  Result Date: 03/04/2017 CLINICAL DATA:  Preop left knee surgery EXAM: CHEST  2 VIEW COMPARISON:  None. FINDINGS: The heart size and mediastinal contours are within normal limits. Both lungs are clear. The visualized skeletal structures are unremarkable. IMPRESSION: No active cardiopulmonary disease. Electronically Signed   By: Elige Ko   On: 03/04/2017 09:10    Disposition: 01-Home or Self Care  Discharge Instructions    CPM   Complete by:  As  directed    Continuous passive motion machine (CPM):      Use the CPM from 0 to 60 for 8 hours per day.      You may increase by 5-10 per day.  You may break it up into 2 or 3 sessions per day.      Use CPM for 1-2 weeks or until you are told to stop.   Call MD / Call 911   Complete by:  As directed    If  you experience chest pain or shortness of breath, CALL 911 and be transported to the hospital emergency room.  If you develope a fever above 101 F, pus (white drainage) or increased drainage or redness at the wound, or calf pain, call your surgeon's office.   Constipation Prevention   Complete by:  As directed    Drink plenty of fluids.  Prune juice may be helpful.  You may use a stool softener, such as Colace (over the counter) 100 mg twice a day.  Use MiraLax (over the counter) for constipation as needed.   Diet general   Complete by:  As directed    Do not put a pillow under the knee. Place it under the heel.   Complete by:  As directed    Increase activity slowly as tolerated   Complete by:  As directed    Weight bearing as tolerated   Complete by:  As directed    Laterality:  left   Extremity:  Lower      Follow-up Information    Jodi GeraldsGraves, John, MD. Schedule an appointment as soon as possible for a visit in 2 weeks.   Specialty:  Orthopedic Surgery Contact information: [address] [place] [address] [phone]            Signed: Matthew FolksBETHUNE,Vernell Back G 03/12/2017, 8:23 AM

## 2017-03-12 NOTE — Progress Notes (Signed)
Physical Therapy Treatment Patient Details Name: Deborah Williamson MRN: [id number] DOB: 1961/03/11 Today's Date: 03/12/2017    History of Present Illness Admitted for L TKA, WBAT, KI with amb; Recent R knee scope as well, still somewhat painful;  has a pertinent past medical history of Anxiety, Ascending aortic aneurysm (HCC) (01/2013), Bipolar II disorder, most recent episode major depressive (HCC), DJD (degenerative joint disease) of knee (01/02/2017), Dysrhythmia,Normocytic anemia, NSTEMI (non-ST elevated myocardial infarction) (HCC) (02/16/2013), Obesity, OCD (obsessive compulsive disorder),PTSD (post-traumatic stress disorder),  Takotsubo cardiomyopathy (01/2013)    PT Comments    Continuing work on functional mobility and activity tolerance;   Very painful (to tears this am session), Got up and to recliner, when we decided to return later for amb and stair training after she has more effective pain control  Follow Up Recommendations  DC plan and follow up therapy as arranged by surgeon;Supervision/Assistance - 24 hour     Equipment Recommendations  Rolling walker with 5" wheels;3in1 (PT)(wide)    Recommendations for Other Services       Precautions / Restrictions Precautions Precautions: Knee;Fall Precaution Booklet Issued: Yes (comment) Precaution Comments: Pt educated to not allow any pillow or bolster under knee for healing with optimal range of motion.  Required Braces or Orthoses: Knee Immobilizer - Left Knee Immobilizer - Left: On when out of bed or walking Restrictions LLE Weight Bearing: Weight bearing as tolerated    Mobility  Bed Mobility Overal bed mobility: Needs Assistance Bed Mobility: Supine to Sit     Supine to sit: Min assist     General bed mobility comments: Cues for technqiue; min assist to support LLE coming off of the bed   Transfers Overall transfer level: Needs assistance Equipment used: Rolling walker (2 wheeled) Transfers: Sit to/from  Stand Sit to Stand: Mod assist;+2 physical assistance         General transfer comment: Light mod assist to steady during power up; took incr time to organize UEs to push up and initiate with anterior weight shift; dependent on momentum for rise  Ambulation/Gait Ambulation/Gait assistance: Min assist;+2 safety/equipment(chair push) Ambulation Distance (Feet): 20 Feet Assistive device: Rolling walker (2 wheeled) Gait Pattern/deviations: Decreased stance time - left     General Gait Details: Cues for gait sequence and to support self on RW; recliner pushed behind for safety   Stairs            Wheelchair Mobility    Modified Rankin (Stroke Patients Only)       Balance     Sitting balance-Leahy Scale: Fair       Standing balance-Leahy Scale: Poor                              Cognition Arousal/Alertness: Awake/alert Behavior During Therapy: WFL for tasks assessed/performed Overall Cognitive Status: Within Functional Limits for tasks assessed                                 General Comments: Becomes emotional easily      Exercises      General Comments        Pertinent Vitals/Pain Pain Assessment: Faces Faces Pain Scale: Hurts worst Pain Location: L knee Pain Descriptors / Indicators: Aching;Constant;Discomfort;Grimacing;Guarding;Crying Pain Intervention(s): Limited activity within patient's tolerance;Monitored during session;Patient requesting pain meds-RN notified    Home Living  Prior Function            PT Goals (current goals can now be found in the care plan section) Acute Rehab PT Goals Patient Stated Goal: Did not state, but clearly doesn't want to hurt PT Goal Formulation: With patient Time For Goal Achievement: 03/25/17 Potential to Achieve Goals: Good Progress towards PT goals: Progressing toward goals    Frequency    7X/week      PT Plan Current plan remains  appropriate    Co-evaluation              AM-PAC PT "6 Clicks" Daily Activity  Outcome Measure  Difficulty turning over in bed (including adjusting bedclothes, sheets and blankets)?: A Lot Difficulty moving from lying on back to sitting on the side of the bed? : A Little Difficulty sitting down on and standing up from a chair with arms (e.g., wheelchair, bedside commode, etc,.)?: A Little Help needed moving to and from a bed to chair (including a wheelchair)?: A Little Help needed walking in hospital room?: A Little Help needed climbing 3-5 steps with a railing? : A Lot 6 Click Score: 16    End of Session Equipment Utilized During Treatment: Gait belt;Left knee immobilizer Activity Tolerance: Patient limited by pain Patient left: in chair;with call bell/phone within reach;with nursing/sitter in room Nurse Communication: Mobility status PT Visit Diagnosis: Unsteadiness on feet (R26.81);Other abnormalities of gait and mobility (R26.89);Pain Pain - Right/Left: Left Pain - part of body: Knee     Time: 0920-0939 PT Time Calculation (min) (ACUTE ONLY): 19 min  Charges:  $Gait Training: 8-22 mins                    G Codes:       Deborah Williamson, PT  Acute Rehabilitation Services Pager [phone] Office [phone]    Deborah AlandHolly H Ragina Williamson 03/12/2017, 11:12 AM

## 2017-03-12 NOTE — Plan of Care (Signed)
  Adequate for Discharge Education: Knowledge of General Education information will improve 03/12/2017 1239 - Adequate for Discharge by Darreld Mcleanox, Brandye Inthavong, RN Health Behavior/Discharge Planning: Ability to manage health-related needs will improve 03/12/2017 1239 - Adequate for Discharge by Darreld Mcleanox, Chirstine Defrain, RN Clinical Measurements: Ability to maintain clinical measurements within normal limits will improve 03/12/2017 1239 - Adequate for Discharge by Darreld Mcleanox, Nevin Kozuch, RN Will remain free from infection 03/12/2017 1239 - Adequate for Discharge by Darreld Mcleanox, Gumecindo Hopkin, RN Diagnostic test results will improve 03/12/2017 1239 - Adequate for Discharge by Darreld Mcleanox, Trannie Bardales, RN Respiratory complications will improve 03/12/2017 1239 - Adequate for Discharge by Darreld Mcleanox, Arnetra Terris, RN Cardiovascular complication will be avoided 03/12/2017 1239 - Adequate for Discharge by Darreld Mcleanox, Gamal Todisco, RN Activity: Risk for activity intolerance will decrease 03/12/2017 1239 - Adequate for Discharge by Darreld Mcleanox, Anastacia Reinecke, RN Nutrition: Adequate nutrition will be maintained 03/12/2017 1239 - Adequate for Discharge by Darreld Mcleanox, Maxamus Colao, RN Coping: Level of anxiety will decrease 03/12/2017 1239 - Adequate for Discharge by Darreld Mcleanox, Larcenia Holaday, RN Elimination: Will not experience complications related to bowel motility 03/12/2017 1239 - Adequate for Discharge by Darreld Mcleanox, Danuta Huseman, RN Will not experience complications related to urinary retention 03/12/2017 1239 - Adequate for Discharge by Darreld Mcleanox, Estiven Kohan, RN Pain Managment: General experience of comfort will improve 03/12/2017 1239 - Adequate for Discharge by Darreld Mcleanox, Clester Chlebowski, RN Safety: Ability to remain free from injury will improve 03/12/2017 1239 - Adequate for Discharge by Darreld Mcleanox, Annick Dimaio, RN Skin Integrity: Risk for impaired skin integrity will decrease 03/12/2017 1239 - Adequate for Discharge by Darreld Mcleanox, Donnice Nielsen, RN Education: Knowledge of the prescribed therapeutic regimen will improve 03/12/2017 1239 - Adequate for Discharge by Darreld Mcleanox, Dayon Witt, RN Activity: Ability  to avoid complications of mobility impairment will improve 03/12/2017 1239 - Adequate for Discharge by Darreld Mcleanox, Cyan Clippinger, RN Range of joint motion will improve 03/12/2017 1239 - Adequate for Discharge by Darreld Mcleanox, Tabb Croghan, RN Clinical Measurements: Postoperative complications will be avoided or minimized 03/12/2017 1239 - Adequate for Discharge by Darreld Mcleanox, Kylyn Mcdade, RN Pain Management: Pain level will decrease with appropriate interventions 03/12/2017 1239 - Adequate for Discharge by Darreld Mcleanox, Lurine Imel, RN Skin Integrity: Signs of wound healing will improve 03/12/2017 1239 - Adequate for Discharge by Darreld Mcleanox, Allie Ousley, RN

## 2017-03-12 NOTE — Progress Notes (Signed)
Subjective: 2 Days Post-Op Procedure(s) (LRB): TOTAL KNEE ARTHROPLASTY (Left) Patient reports pain as moderate.  Taking by mouth and voiding okay.  Making progress with physical therapy albeit slow.  Objective: Vital signs in last 24 hours: Temp:  [98.4 F (36.9 C)-99.1 F (37.3 C)] 98.4 F (36.9 C) (12/19 0602) Pulse Rate:  [65-77] 73 (12/19 0602) Resp:  [16-17] 16 (12/19 0602) BP: (99-126)/(56-76) 126/76 (12/19 0602) SpO2:  [98 %-100 %] 100 % (12/19 0602)  Intake/Output from previous day: 12/18 0701 - 12/19 0700 In: 596 [P.O.:596] Out: 1100 [Urine:1100] Intake/Output this shift: No intake/output data recorded.  Recent Labs    03/11/17 0656 03/12/17 0543  HGB 10.0* 9.1*   Recent Labs    03/11/17 0656 03/12/17 0543  WBC 10.6* 14.5*  RBC 3.83* 3.45*  HCT 31.4* 28.3*  PLT 235 197   Recent Labs    03/11/17 0656  NA 136  K 4.2  CL 103  CO2 24  BUN 13  CREATININE 0.75  GLUCOSE 143*  CALCIUM 8.7*   No results for input(s): LABPT, INR in the last 72 hours. Left knee exam: Neurovascular intact Sensation intact distally Dorsiflexion/Plantar flexion intact Incision: dressing C/D/I Compartment soft  Assessment/Plan: 2 Days Post-Op Procedure(s) (LRB): TOTAL KNEE ARTHROPLASTY (Left) Plan: Weight-bear as tolerated on the left. Aspirin 325 mg twice daily for DVT prophylaxis times 1 months postop. Up with therapy Discharge home with home health Follow-up with Dr. Luiz BlareGraves in 2 weeks.  Seven Dollens G 03/12/2017, 8:20 AM

## 2017-03-12 NOTE — Progress Notes (Signed)
Physical Therapy Treatment Patient Details Name: Deborah Williamson MRN: [id number] DOB: 02-13-1961 Today's Date: 03/12/2017    History of Present Illness Admitted for L TKA, WBAT, KI with amb; Recent R knee scope as well, still somewhat painful;  has a pertinent past medical history of Anxiety, Ascending aortic aneurysm ([place]) (01/2013), Bipolar II disorder, most recent episode major depressive ([place]), DJD (degenerative joint disease) of knee (01/02/2017), Dysrhythmia,Normocytic anemia, NSTEMI (non-ST elevated myocardial infarction) ([place]) (02/16/2013), Obesity, OCD (obsessive compulsive disorder),PTSD (post-traumatic stress disorder),  Takotsubo cardiomyopathy (01/2013)    PT Comments    Patient received sitting on bedside commode at sink in bathroom, pleasant and willing to participate in 3rd PT session this morning. Min guard for sit to stand and ambulation back to bed, min assist to support L LE with sit to supine into bed. Focused this session on functional total joint exercise with patient continuing to be very pain limited and with very limited ROM in L knee. Education provided on general course of recovery following TKR surgery, importance of mobility and exercise in recovery from this surgery. Patient left in bed with all needs met and questions addressed this morning.    Follow Up Recommendations  DC plan and follow up therapy as arranged by surgeon;Supervision/Assistance - 24 hour     Equipment Recommendations  Rolling walker with 5" wheels;3in1 (PT)(wide )    Recommendations for Other Services OT consult(ordered per protocol )     Precautions / Restrictions Precautions Precautions: Fall;Knee Precaution Booklet Issued: Yes (comment) Precaution Comments:  Required Braces or Orthoses: Knee Immobilizer - Left Knee Immobilizer - Left: On when out of bed or walking Restrictions Weight Bearing Restrictions: Yes LLE Weight Bearing: Weight bearing as tolerated    Mobility  Bed  Mobility Overal bed mobility: (Pt up in recliner chair upon OT arrival) Bed Mobility: Sit to Supine     Supine to sit: Min assist Sit to supine: Min guard   General bed mobility comments: min assist to support L LE   Transfers Overall transfer level: Needs assistance Equipment used: Rolling walker (2 wheeled) Transfers: Sit to/from Stand Sit to Stand: Min guard Stand pivot transfers: Min assist       General transfer comment: Min assist to steady; cues for hand placement and safety; better, more controlled rise from chair/3:1 with armrests  Ambulation/Gait Ambulation/Gait assistance: Min guard Ambulation Distance (Feet): 10 Feet Assistive device: Rolling walker (2 wheeled) Gait Pattern/deviations: Decreased step length - right;Decreased stance time - left;Antalgic     General Gait Details: Cues for gait sequence and to support self on RW; tending to flex hips, cues for upright posture   Stairs Stairs: Yes   Stair Management: One rail Left;Step to pattern;Sideways Number of Stairs: 2(x3) General stair comments: Took extra time to problem-solve through technique; Cues for sequence and to give enough space for 2 feet on each step; Has 5 steps to enter mother's home, and she tells me the rail on L to enter is Location manager    Modified Rankin (Stroke Patients Only)       Balance Overall balance assessment: Needs assistance Sitting-balance support: Bilateral upper extremity supported;Feet supported Sitting balance-Leahy Scale: Fair     Standing balance support: Bilateral upper extremity supported;During functional activity Standing balance-Leahy Scale: Fair                              Cognition Arousal/Alertness: Awake/alert Behavior During Therapy:  WFL for tasks assessed/performed Overall Cognitive Status: Within Functional Limits for tasks assessed                                 General Comments: Becomes emotional  easily      Exercises Total Joint Exercises Ankle Circles/Pumps: Both;10 reps;Supine Quad Sets: Left;10 reps;Supine Gluteal Sets: Both;10 reps;Supine Short Arc Quad: Left;10 reps;Supine Heel Slides: Left;10 reps;Supine Hip ABduction/ADduction: Left;10 reps;Supine Straight Leg Raises: Left;10 reps;Supine    General Comments        Pertinent Vitals/Pain Pain Assessment: 0-10 Pain Score: 10-Worst pain ever Faces Pain Scale: Hurts even more Pain Location: L knee Pain Descriptors / Indicators: Aching;Constant;Discomfort;Grimacing;Guarding;Crying Pain Intervention(s): Limited activity within patient's tolerance;Monitored during session;Repositioned    Home Living Family/patient expects to be discharged to:: Private residence Living Arrangements: Parent Available Help at Discharge: Family;Available PRN/intermittently(Likely near 24 hours) Type of Home: House Home Access: Stairs to enter Entrance Stairs-Rails: Left Home Layout: One level Home Equipment: Walker - 2 wheels(Borrowing mother's) Additional Comments: Pt tells me she would really like to dc to her own home, however will likely not have assist in her own home; She is willing to go to her mother's home; she went to her daughter's home after R knee scope and doesn't want to do that agian.    Prior Function Level of Independence: Independent          PT Goals (current goals can now be found in the care plan section) Acute Rehab PT Goals Patient Stated Goal: Decreased pain; d/c to her mother's house when able PT Goal Formulation: With patient Time For Goal Achievement: 03/25/17 Potential to Achieve Goals: Good Progress towards PT goals: Progressing toward goals    Frequency    7X/week      PT Plan Current plan remains appropriate    Co-evaluation              AM-PAC PT "6 Clicks" Daily Activity  Outcome Measure  Difficulty turning over in bed (including adjusting bedclothes, sheets and blankets)?:  Unable Difficulty moving from lying on back to sitting on the side of the bed? : Unable Difficulty sitting down on and standing up from a chair with arms (e.g., wheelchair, bedside commode, etc,.)?: Unable Help needed moving to and from a bed to chair (including a wheelchair)?: A Little Help needed walking in hospital room?: A Little Help needed climbing 3-5 steps with a railing? : A Little 6 Click Score: 12    End of Session Equipment Utilized During Treatment: Left knee immobilizer Activity Tolerance: Patient tolerated treatment well Patient left: in bed;with call bell/phone within reach Nurse Communication: Mobility status PT Visit Diagnosis: Unsteadiness on feet (R26.81);Other abnormalities of gait and mobility (R26.89);Pain Pain - Right/Left: Left Pain - part of body: Knee     Time: 1130-1148 PT Time Calculation (min) (ACUTE ONLY): 18 min  Charges:  $Gait Training: 23-37 mins $Therapeutic Exercise: 8-22 mins                    G Codes:       Deniece Ree PT, DPT, CBIS  Supplemental Physical Therapist [place]

## 2017-03-12 NOTE — Evaluation (Signed)
Occupational Therapy Evaluation Patient Details Name: Deborah Williamson MRN: [id number] DOB: 04-Mar-1961 Today's Date: 03/12/2017    History of Present Illness Admitted for L TKA, WBAT, KI with amb; Recent R knee scope as well, still somewhat painful;  has a pertinent past medical history of Anxiety, Ascending aortic aneurysm ([facility]) (01/2013), Bipolar II disorder, most recent episode major depressive ([facility]), DJD (degenerative joint disease) of knee (01/02/2017), Dysrhythmia,Normocytic anemia, NSTEMI (non-ST elevated myocardial infarction) ([facility]) (02/16/2013), Obesity, OCD (obsessive compulsive disorder),PTSD (post-traumatic stress disorder),  Takotsubo cardiomyopathy (01/2013)   Clinical Impression   Pt admitted as above, currently demonstrating deficits in her ability to perform ADL's and functional transfers (see OT problem list below). Pain is a limiting factor. She should benefit from acute OT to further address shower/toilet transfers, LB ADL's and provide pt education to assist in maximizing independence & return to prior level of function at independent level.    Follow Up Recommendations  Supervision/Assistance - 24 hour;Home health OT    Equipment Recommendations  3 in 1 bedside commode;Other (comment)(Possibly LH AE for PRN use)    Recommendations for Other Services       Precautions / Restrictions Precautions Precautions: Fall;Knee Precaution Booklet Issued: Yes (comment) Required Braces or Orthoses: Knee Immobilizer - Left Knee Immobilizer - Left: On when out of bed or walking Restrictions Weight Bearing Restrictions: Yes LLE Weight Bearing: Weight bearing as tolerated      Mobility Bed Mobility Overal bed mobility: (Pt up in recliner chair upon OT arrival) Bed Mobility: Sit to Supine     Supine to sit: Min assist Sit to supine: Min guard   General bed mobility comments: min assist to support L LE   Transfers Overall transfer level: Needs assistance Equipment  used: Rolling walker (2 wheeled) Transfers: Sit to/from Stand Sit to Stand: Min guard Stand pivot transfers: Min assist       General transfer comment: Min assist to steady; cues for hand placement and safety; better, more controlled rise from chair/3:1 with armrests    Balance Overall balance assessment: Needs assistance Sitting-balance support: Bilateral upper extremity supported;Feet supported Sitting balance-Leahy Scale: Fair     Standing balance support: Bilateral upper extremity supported;During functional activity Standing balance-Leahy Scale: Fair                             ADL either performed or assessed with clinical judgement   ADL Overall ADL's : Needs assistance/impaired Eating/Feeding: Independent;Sitting   Grooming: Wash/dry hands;Wash/dry face;Oral care;Sitting;Modified independent Grooming Details (indicate cue type and reason): Sitting for grooming at sink Upper Body Bathing: Modified independent;Sitting;Set up Upper Body Bathing Details (indicate cue type and reason): Sitting at sink Lower Body Bathing: Minimal assistance;Sit to/from stand   Upper Body Dressing : Sitting;Modified independent   Lower Body Dressing: Minimal assistance;Sit to/from stand Lower Body Dressing Details (indicate cue type and reason): Becomes anxious easily. Benefits from positive re-enforcement. She was educated in [facility] AE, states her mother will be able to assist PRN LB ADL's Toilet Transfer: Ambulation;RW;BSC;Cueing for sequencing;Min guard;Cueing for safety Toilet Transfer Details (indicate cue type and reason): Pt ambulated into bathroom with RW, Min guard for sequencing/safety with RW Toileting- Clothing Manipulation and Hygiene: Modified independent;Sitting/lateral lean   Tub/ Shower Transfer: Walk-in shower;Minimal assistance;Cueing for safety;Cueing for sequencing;Ambulation;Rolling walker;3 in 1;Anterior/posterior Tub/Shower Transfer Details (indicate cue type  and reason): Pt reports that her mother has a walk in shower at her house with door. Pt  practiced side stepping into shower and then anterior/posterior approach. Use of 3:1 in shower to sit. Functional mobility during ADLs: Min guard;Cueing for safety;Cueing for sequencing;Rolling walker General ADL Comments: Pt requires increased encouragement to participate but works well when pre-medicated. She will benefit from 3:1 assist and PRN assist from her mother at d/c. She participated in ADL retraining session in rehab gym for shower transfer (req consistent vc's for hand placement and safety with RW, using 3:1. Would benefit from second session if pt does not d/c home today. Pt performed toileting tranasfers and grooming/bathing sitting at sink once back in her room today. KI on at all times     Vision Patient Visual Report: No change from baseline       Perception     Praxis      Pertinent Vitals/Pain Pain Assessment: 0-10 Pain Score: 10-Worst pain ever Faces Pain Scale: Hurts even more Pain Location: L knee Pain Descriptors / Indicators: Aching;Constant;Discomfort;Grimacing;Guarding;Crying Pain Intervention(s): Limited activity within patient's tolerance;Monitored during session;Repositioned     Hand Dominance Right   Extremity/Trunk Assessment Upper Extremity Assessment Upper Extremity Assessment: Generalized weakness   Lower Extremity Assessment Lower Extremity Assessment: Defer to PT evaluation       Communication Communication Communication: No difficulties   Cognition Arousal/Alertness: Awake/alert Behavior During Therapy: WFL for tasks assessed/performed Overall Cognitive Status: Within Functional Limits for tasks assessed                                 General Comments: Becomes emotional easily   General Comments       Exercises Total Joint Exercises Ankle Circles/Pumps: Both;10 reps;Supine Quad Sets: Left;10 reps;Supine Gluteal Sets: Both;10  reps;Supine Short Arc Quad: Left;10 reps;Supine Heel Slides: Left;10 reps;Supine Hip ABduction/ADduction: Left;10 reps;Supine Straight Leg Raises: Left;10 reps;Supine   Shoulder Instructions      Home Living Family/patient expects to be discharged to:: Private residence Living Arrangements: Parent Available Help at Discharge: Family;Available PRN/intermittently(Likely near 24 hours) Type of Home: House Home Access: Stairs to enter Entergy CorporationEntrance Stairs-Number of Steps: 5 Entrance Stairs-Rails: Left Home Layout: One level     Bathroom Shower/Tub: Producer, television/film/videoWalk-in shower   Bathroom Toilet: Standard     Home Equipment: Environmental consultantWalker - 2 wheels(Borrowing mother's)   Additional Comments: Pt tells me she would really like to dc to her own home, however will likely not have assist in her own home; She is willing to go to her mother's home; she went to her daughter's home after R knee scope and doesn't want to do that agian.      Prior Functioning/Environment Level of Independence: Independent                 OT Problem List: Pain;Decreased knowledge of precautions;Decreased knowledge of use of DME or AE;Decreased activity tolerance      OT Treatment/Interventions: Self-care/ADL training;DME and/or AE instruction;Therapeutic activities;Patient/family education    OT Goals(Current goals can be found in the care plan section) Acute Rehab OT Goals Patient Stated Goal: Decreased pain; d/c to her mother's house when able OT Goal Formulation: With patient Time For Goal Achievement: 03/26/17 Potential to Achieve Goals: Good  OT Frequency: Min 2X/week   Barriers to D/C:            Co-evaluation              AM-PAC PT "6 Clicks" Daily Activity     Outcome Measure Help from another  person eating meals?: None Help from another person taking care of personal grooming?: A Little Help from another person toileting, which includes using toliet, bedpan, or urinal?: A Little Help from another  person bathing (including washing, rinsing, drying)?: A Little Help from another person to put on and taking off regular upper body clothing?: None Help from another person to put on and taking off regular lower body clothing?: A Little 6 Click Score: 20   End of Session Equipment Utilized During Treatment: Rolling walker;Gait belt CPM Left Knee CPM Left Knee: Off Nurse Communication: Other (comment)(Called NT: Pt completing grooming at sink in bathroom, will use call bell when finished.)  Activity Tolerance: Patient tolerated treatment well Patient left: in chair;with call bell/phone within reach  OT Visit Diagnosis: Unsteadiness on feet (R26.81);Pain Pain - Right/Left: Left Pain - part of body: Knee                Time: [id number] OT Time Calculation (min): 30 min Charges:  OT General Charges $OT Visit: 1 Visit OT Evaluation $OT Eval Moderate Complexity: 1 Mod OT Treatments $Self Care/Home Management : 8-22 mins G-Codes:      Alm BustardBarnhill, Omaria Plunk Beth Dixon, OTR/L 03/12/2017, 12:02 PM

## 2017-03-12 NOTE — Progress Notes (Signed)
Physical Therapy Treatment Patient Details Name: Deborah HeadlandMarion D Williamson MRN: [id number] DOB: 06-25-1960 Today's Date: 03/12/2017    History of Present Illness Admitted for L TKA, WBAT, KI with amb; Recent R knee scope as well, still somewhat painful;  has a pertinent past medical history of Anxiety, Ascending aortic aneurysm ([facility]) (01/2013), Bipolar II disorder, most recent episode major depressive ([facility]), DJD (degenerative joint disease) of knee (01/02/2017), Dysrhythmia,Normocytic anemia, NSTEMI (non-ST elevated myocardial infarction) ([facility]) (02/16/2013), Obesity, OCD (obsessive compulsive disorder),PTSD (post-traumatic stress disorder),  Takotsubo cardiomyopathy (01/2013)    PT Comments    Continuing work on functional mobility and activity tolerance;  Better pain control this session, and we were able to work on Museum/gallery curatorstair training; Took extra time to problem-solve through options, and Ms. Deborah Williamson came up with a workable technique herself (L rail going sideways) -- this shows that once home, Ms. Deborah Williamson will be able to figure things out, and I hope this boosts her confidence   Follow Up Recommendations  DC plan and follow up therapy as arranged by surgeon;Supervision/Assistance - 24 hour     Equipment Recommendations  Rolling walker with 5" wheels;3in1 (PT)(wide)    Recommendations for Other Services       Precautions / Restrictions Precautions Precautions: Knee;Fall Precaution Booklet Issued: Yes (comment) Precaution Comments: Pt educated to not allow any pillow or bolster under knee for healing with optimal range of motion.  Required Braces or Orthoses: Knee Immobilizer - Left Knee Immobilizer - Left: On when out of bed or walking Restrictions Weight Bearing Restrictions: Yes LLE Weight Bearing: Weight bearing as tolerated    Mobility  Bed Mobility Overal bed mobility: Needs Assistance Bed Mobility: Supine to Sit     Supine to sit: Min assist     General bed mobility  comments: Cues for technqiue; min assist to support LLE coming off of the bed   Transfers Overall transfer level: Needs assistance Equipment used: Rolling walker (2 wheeled) Transfers: Sit to/from Stand Sit to Stand: Min assist         General transfer comment: Min assist to steady; cues for hand placement and safety; better, more controlled rise from chair with armrests  Ambulation/Gait Ambulation/Gait assistance: Min guard Ambulation Distance (Feet): 55 Feet Assistive device: Rolling walker (2 wheeled) Gait Pattern/deviations: Decreased step length - right;Decreased stance time - left     General Gait Details: Cues for gait sequence and to support self on RW; tending to flex hips, cues for upright posture   Stairs Stairs: Yes   Stair Management: One rail Left;Step to pattern;Sideways Number of Stairs: 2(x3) General stair comments: Took extra time to problem-solve through technique; Cues for sequence and to give enough space for 2 feet on each step; Has 5 steps to enter mother's home, and she tells me the rail on L to enter is sturdy  Wheelchair Mobility    Modified Rankin (Stroke Patients Only)       Balance     Sitting balance-Leahy Scale: Fair       Standing balance-Leahy Scale: Poor                              Cognition Arousal/Alertness: Awake/alert Behavior During Therapy: WFL for tasks assessed/performed Overall Cognitive Status: Within Functional Limits for tasks assessed  General Comments: Becomes emotional easily      Exercises      General Comments        Pertinent Vitals/Pain Pain Assessment: Faces Faces Pain Scale: Hurts even more Pain Location: L knee Pain Descriptors / Indicators: Aching;Constant;Discomfort;Grimacing;Guarding;Crying Pain Intervention(s): Limited activity within patient's tolerance;Monitored during session;Premedicated before session;Repositioned    Home  Living Family/patient expects to be discharged to:: Private residence Living Arrangements: Parent Available Help at Discharge: Family;Available PRN/intermittently(Likely near 24 hours) Type of Home: House Home Access: Stairs to enter Entrance Stairs-Rails: Left Home Layout: One level Home Equipment: Walker - 2 wheels(Borrowing mother's) Additional Comments: Pt tells me she would really like to dc to her own home, however will likely not have assist in her own home; She is willing to go to her mother's home; she went to her daughter's home after R knee scope and doesn't want to do that agian.    Prior Function Level of Independence: Independent          PT Goals (current goals can now be found in the care plan section) Acute Rehab PT Goals Patient Stated Goal: Did not state, but clearly doesn't want to hurt PT Goal Formulation: With patient Time For Goal Achievement: 03/25/17 Potential to Achieve Goals: Good Progress towards PT goals: Progressing toward goals    Frequency    7X/week      PT Plan Current plan remains appropriate    Co-evaluation              AM-PAC PT "6 Clicks" Daily Activity  Outcome Measure  Difficulty turning over in bed (including adjusting bedclothes, sheets and blankets)?: A Lot Difficulty moving from lying on back to sitting on the side of the bed? : A Little Difficulty sitting down on and standing up from a chair with arms (e.g., wheelchair, bedside commode, etc,.)?: A Little Help needed moving to and from a bed to chair (including a wheelchair)?: A Little Help needed walking in hospital room?: A Little Help needed climbing 3-5 steps with a railing? : A Little 6 Click Score: 17    End of Session Equipment Utilized During Treatment: Gait belt;Left knee immobilizer Activity Tolerance: Patient tolerated treatment well Patient left: in chair;with call bell/phone within reach;Other (comment)(going to gym to work with OT) Nurse Communication:  Mobility status PT Visit Diagnosis: Unsteadiness on feet (R26.81);Other abnormalities of gait and mobility (R26.89);Pain Pain - Right/Left: Left Pain - part of body: Knee     Time: [id number] PT Time Calculation (min) (ACUTE ONLY): 27 min  Charges:  $Gait Training: 23-37 mins                    G Codes:       Deborah Williamson, PT  Acute Rehabilitation Services Pager [phone] Office [phone]    Deborah AlandHolly H Benedicto Williamson 03/12/2017, 11:48 AM

## 2017-03-12 NOTE — Progress Notes (Signed)
Orthopedic Tech Progress Note Patient Details:  Deborah Williamson 1960-11-05 [id number]  CPM Left Knee CPM Left Knee: On Left Knee Flexion (Degrees): 60 Left Knee Extension (Degrees): 0 Additional Comments: applied cpm to pt left knee/leg at 0-60 degress.  pt was very nervous about cpm and we decided 60 degrees to start was better for pt.  pt tolerated application fair.   Post Interventions Patient Tolerated: Fair Instructions Provided: Care of device  Saul FordyceJennifer C Emmilyn Crooke 03/12/2017, 1:42 PM

## 2017-03-19 DIAGNOSIS — G43909 Migraine, unspecified, not intractable, without status migrainosus: Secondary | ICD-10-CM | POA: Insufficient documentation

## 2017-03-19 DIAGNOSIS — I499 Cardiac arrhythmia, unspecified: Secondary | ICD-10-CM | POA: Insufficient documentation

## 2017-03-19 DIAGNOSIS — G4700 Insomnia, unspecified: Secondary | ICD-10-CM | POA: Insufficient documentation

## 2017-03-19 DIAGNOSIS — F3181 Bipolar II disorder: Secondary | ICD-10-CM | POA: Insufficient documentation

## 2017-03-19 DIAGNOSIS — E669 Obesity, unspecified: Secondary | ICD-10-CM | POA: Insufficient documentation

## 2017-03-19 DIAGNOSIS — M722 Plantar fascial fibromatosis: Secondary | ICD-10-CM | POA: Insufficient documentation

## 2017-03-19 DIAGNOSIS — F419 Anxiety disorder, unspecified: Secondary | ICD-10-CM | POA: Insufficient documentation

## 2017-03-19 DIAGNOSIS — F429 Obsessive-compulsive disorder, unspecified: Secondary | ICD-10-CM | POA: Insufficient documentation

## 2017-03-19 DIAGNOSIS — F4310 Post-traumatic stress disorder, unspecified: Secondary | ICD-10-CM | POA: Insufficient documentation

## 2017-03-19 NOTE — Anesthesia Postprocedure Evaluation (Signed)
Anesthesia Post Note  Patient: Linward HeadlandMarion D Noreen  Procedure(s) Performed: TOTAL KNEE ARTHROPLASTY (Left Knee)     Patient location during evaluation: PACU Anesthesia Type: Spinal Level of consciousness: oriented and awake and alert Pain management: pain level controlled Vital Signs Assessment: post-procedure vital signs reviewed and stable Respiratory status: spontaneous breathing, respiratory function stable and patient connected to nasal cannula oxygen Cardiovascular status: blood pressure returned to baseline and stable Postop Assessment: no headache, no backache and no apparent nausea or vomiting Anesthetic complications: no    Last Vitals:  Vitals:   03/11/17 2155 03/12/17 0602  BP: (!) 99/56 126/76  Pulse: 77 73  Resp: 16 16  Temp: 37.3 C 36.9 C  SpO2: 98% 100%    Last Pain:  Vitals:   03/12/17 1225  TempSrc:   PainSc: 10-Worst pain ever                 Lakita Sahlin,JAMES TERRILL

## 2017-03-24 DIAGNOSIS — M25662 Stiffness of left knee, not elsewhere classified: Secondary | ICD-10-CM | POA: Diagnosis not present

## 2017-03-24 DIAGNOSIS — M1712 Unilateral primary osteoarthritis, left knee: Secondary | ICD-10-CM | POA: Diagnosis not present

## 2017-03-24 DIAGNOSIS — M25562 Pain in left knee: Secondary | ICD-10-CM | POA: Diagnosis not present

## 2017-03-24 DIAGNOSIS — Z96652 Presence of left artificial knee joint: Secondary | ICD-10-CM | POA: Diagnosis not present

## 2017-04-04 ENCOUNTER — Ambulatory Visit: Payer: Medicare Other | Admitting: Internal Medicine

## 2017-04-04 ENCOUNTER — Ambulatory Visit: Payer: Self-pay | Admitting: Cardiovascular Disease

## 2017-04-07 ENCOUNTER — Encounter: Payer: Self-pay | Admitting: Internal Medicine

## 2017-04-07 ENCOUNTER — Ambulatory Visit (INDEPENDENT_AMBULATORY_CARE_PROVIDER_SITE_OTHER): Payer: Medicare HMO | Admitting: Internal Medicine

## 2017-04-07 VITALS — BP 170/90 | HR 66 | Resp 12 | Ht 66.0 in | Wt 251.0 lb

## 2017-04-07 DIAGNOSIS — I10 Essential (primary) hypertension: Secondary | ICD-10-CM | POA: Diagnosis not present

## 2017-04-07 DIAGNOSIS — K5901 Slow transit constipation: Secondary | ICD-10-CM

## 2017-04-07 NOTE — Progress Notes (Signed)
   Subjective:    Patient ID: Linward HeadlandMarion D Iacobucci, female    DOB: 06-15-1960, 57 y.o.   MRN: [id number]  HPI   1.  Total Left Knee Replacement:  12.17.2018.  Still with a lot of pain and requiring Percocet.  Nauseated from the Percocet and constipated which seems to make the nausea worse.  2.  Constipated:  Not taking any of the bowel regimen she needs to.  Last BM was 2 days ago and very hard and small.  Has Miralax, Senna and Docusate.    Current Meds  Medication Sig  . acetaminophen (TYLENOL) 500 MG tablet Take 1,000 mg by mouth 2 (two) times daily as needed for moderate pain or headache.  Marland Kitchen. aspirin EC 325 MG tablet Take 1 tablet (325 mg total) by mouth 2 (two) times daily after a meal. Take x 1 month post op to decrease risk of blood clots.  . carvedilol (COREG) 6.25 MG tablet TAKE 1 TABLET BY MOUTH TWICE A DAY WITH A MEAL  . docusate sodium (COLACE) 100 MG capsule Take 1 capsule (100 mg total) by mouth 2 (two) times daily.  Marland Kitchen. losartan (COZAAR) 100 MG tablet TAKE 1 TABLET (100 MG TOTAL) BY MOUTH DAILY.  . Multiple Vitamin (MULTIVITAMIN WITH MINERALS) TABS tablet Take 1 tablet by mouth daily.  . Multiple Vitamins-Minerals (HAIR SKIN AND NAILS FORMULA) TABS Take 1 tablet by mouth daily.  . ondansetron (ZOFRAN) 4 MG tablet Take 4 mg by mouth daily as needed for nausea or vomiting.   Marland Kitchen. oxyCODONE-acetaminophen (PERCOCET/ROXICET) 5-325 MG tablet Take 1-2 tablets by mouth every 6 (six) hours as needed for severe pain.  . rosuvastatin (CRESTOR) 10 MG tablet TAKE 1 TABLET BY MOUTH DAILY  . tiZANidine (ZANAFLEX) 2 MG tablet Take 1 tablet (2 mg total) by mouth every 8 (eight) hours as needed for muscle spasms.    Allergies  Allergen Reactions  . Lisinopril Other (See Comments)    Dry cough  . Vicodin [Hydrocodone-Acetaminophen] Nausea Only   Review of Systems     Objective:   Physical Exam NAD LUngs:  CTA CV:  RRR without murmur or rub, radial pulses normal and Equal Abd:  S, mild  tenderness LLQ, no rebound or peritoneal signs,  + BS, No HSM or mass. LE:  Left leg wound over knee dressing C and D.       Assessment & Plan:  1.  Constipation:  Probably large reason why she is having so much nausea and vomiting.  To get back on Senna, Docusate and Miralax and wean as needed as she clears her stool.  2.  Hypertension:  Up today as likely due to multiple discomforts.  Return in 1 weeks for bp recheck

## 2017-04-07 NOTE — Patient Instructions (Signed)
Take the Docusate 100 mg twice daily and the Miralax 17 g once daily and the senna 2 tabs daily at a time when you know you will be at home for a few hours afterward.

## 2017-05-07 ENCOUNTER — Encounter: Payer: Self-pay | Admitting: Cardiovascular Disease

## 2017-05-07 ENCOUNTER — Ambulatory Visit: Payer: Medicare HMO | Admitting: Cardiovascular Disease

## 2017-05-07 VITALS — BP 170/90 | HR 68 | Ht 66.0 in | Wt 248.0 lb

## 2017-05-07 DIAGNOSIS — I10 Essential (primary) hypertension: Secondary | ICD-10-CM

## 2017-05-07 DIAGNOSIS — I7121 Aneurysm of the ascending aorta, without rupture: Secondary | ICD-10-CM

## 2017-05-07 DIAGNOSIS — I712 Thoracic aortic aneurysm, without rupture, unspecified: Secondary | ICD-10-CM

## 2017-05-07 DIAGNOSIS — I5181 Takotsubo syndrome: Secondary | ICD-10-CM

## 2017-05-07 MED ORDER — CARVEDILOL 12.5 MG PO TABS: 12.5000 mg | ORAL_TABLET | Freq: Two times a day (BID) | ORAL | 11 refills | Status: DC

## 2017-05-07 NOTE — Patient Instructions (Addendum)
Medication Instructions:  Your physician has recommended you make the following change in your medication:  Increase Coreg to 12.5 mg by mouth twice daily.    Labwork: Lab work to be done week or so prior to CTA (BMP)  Testing/Procedures: Non-Cardiac CT Angiography (CTA), is a special type of CT scan that uses a computer to produce multi-dimensional views of major blood vessels throughout the body. In CT angiography, a contrast material is injected through an IV to help visualize the blood vessels To be done in June 2019  Follow-Up: Your physician recommends that you schedule a follow-up appointment in: 12 months. Please call our office in about 9 months to schedule this appointment    Any Other Special Instructions Will Be Listed Below (If Applicable).     If you need a refill on your cardiac medications before your next appointment, please call your pharmacy.

## 2017-05-07 NOTE — Progress Notes (Signed)
Chief Complaint  Patient presents with  . Follow-up    HTN   History of Present Illness: 57 yo female with a history of thoracic aortic aneurysm, HTN and a non-ischemic cardiomyopathy who is here today for cardiac follow up.  She was admitted to [facility] November 2014 with a non-STEMI. Cardiac cath 02/16/2013 with no angiographic evidence of CAD, LVEF 35% with anteroapical, apical and inferoapical HK. Echocardiogram in 2014 with LVEF= 30-35%. grade 1 diastolic dysfunction, trivial MR. Chest/abdominal/pelvic CTA in 2014 showed no aortic dissection, mild aneurysmal dilatation of the ascending thoracic aorta, no pulmonary embolism. She was felt to have a stress induced cardiomyopathy. She was placed on beta blocker and ACEI. Echo 05/07/13 with normal LV systolic function. Last echo June 2018 with normal LV systolic function, grade 1 diastolic dysfunction. Chest CTA June 2018 with stable 4.1 cm aneurysm of the ascending aorta.   She is here today for follow up. The patient denies any chest pain, dyspnea, palpitations, lower extremity edema, orthopnea, PND, dizziness, near syncope or syncope. Only c/o knee pain post replacement December 2018.   Primary Care Physician: Julieanne Manson, MD   Past Medical History:  Diagnosis Date  . Anxiety   . Ascending aortic aneurysm (HCC) 01/2013   3.5-4 cm noted on CT-A  . Bipolar II disorder, most recent episode major depressive (HCC)    Followed at [facility].  Cline Crock, counselor:  [phone]  . DJD (degenerative joint disease) of knee 01/02/2017  . Dysrhythmia    "irregular" (02/17/2013)  . Headache(784.0)    "probably once/week" (02/17/2013)  . Hyperlipidemia   . Hypertension   . Insomnia   . Left ovarian cyst 01/2013   Incidental CT finding  . Migraine    "probably once/week" (02/17/2013)  . Morbid obesity (HCC) 01/02/2017  . Normocytic anemia   . NSTEMI (non-ST elevated myocardial infarction) (HCC) 02/16/2013   Takotsubo  cardiomyopathy, normal cors  . Obesity   . OCD (obsessive compulsive disorder)   . Plantar fasciitis of right foot   . PTSD (post-traumatic stress disorder)   . PTSD (post-traumatic stress disorder)   . Takotsubo cardiomyopathy 01/2013   a. 2D echo 02/17/13: EF 30-35%, periapical AK, normal RV size/function, moderate pulmonary HTN. c/w Takotsubo CM. ;  b.  Echo (04/2013): EF 55%, no WMA, Gr 1 DD, mildly dilated ascending aorta (39 mm), mild MR, mild BAE, PASP 33  . Trichomonas vaginitis 10/2014    Past Surgical History:  Procedure Laterality Date  . BRACHIOPLASTY Bilateral 05/2011   Plastic Surgery to remove loose skin after Weight Loss  . CARDIAC CATHETERIZATION  02/17/2013   no angiographic evidence of CAD, EF 35%, HK or the anteroapical wall, apex and inferoapical wall  . FOOT SURGERY Right   . KNEE ARTHROSCOPY Right 02/05/2017   [facility]  . LEFT HEART CATHETERIZATION WITH CORONARY ANGIOGRAM N/A 02/17/2013   Procedure: LEFT HEART CATHETERIZATION WITH CORONARY ANGIOGRAM;  Surgeon: Kathleene Hazel, MD;  Location: [facility] CATH LAB;  Service: Cardiovascular;  Laterality: N/A;  . MASS EXCISION Right 02/29/2016   Procedure: EXCISION MASS, right wrist;  Surgeon: Betha Loa, MD;  Location: [facility];  Service: Orthopedics;  Laterality: Right;  EXCISION MASS, right wrist  . TOTAL KNEE ARTHROPLASTY Left 03/10/2017   Procedure: TOTAL KNEE ARTHROPLASTY;  Surgeon: Jodi Geralds, MD;  Location: [facility] OR;  Service: Orthopedics;  Laterality: Left;  . TUBAL LIGATION  1989    Current Outpatient Medications  Medication Sig Dispense  Refill  . acetaminophen (TYLENOL) 500 MG tablet Take 1,000 mg by mouth 2 (two) times daily as needed for moderate pain or headache.    Marland Kitchen. aspirin EC 81 MG tablet Take 81 mg by mouth daily.    Marland Kitchen. docusate sodium (COLACE) 100 MG capsule Take 1 capsule (100 mg total) by mouth 2 (two) times daily. 30 capsule 0  . losartan (COZAAR) 100 MG tablet TAKE 1  TABLET (100 MG TOTAL) BY MOUTH DAILY. 30 tablet 11  . Melatonin 5 MG TABS Take 15 mg by mouth at bedtime as needed (sleep).    . Multiple Vitamin (MULTIVITAMIN WITH MINERALS) TABS tablet Take 1 tablet by mouth daily.    . Multiple Vitamins-Minerals (HAIR SKIN AND NAILS FORMULA) TABS Take 1 tablet by mouth daily.    . ondansetron (ZOFRAN) 4 MG tablet Take 4 mg by mouth daily as needed for nausea or vomiting.   0  . oxyCODONE-acetaminophen (PERCOCET/ROXICET) 5-325 MG tablet Take 1-2 tablets by mouth every 6 (six) hours as needed for severe pain. 60 tablet 0  . rosuvastatin (CRESTOR) 10 MG tablet TAKE 1 TABLET BY MOUTH DAILY 30 tablet 11  . tiZANidine (ZANAFLEX) 2 MG tablet Take 1 tablet (2 mg total) by mouth every 8 (eight) hours as needed for muscle spasms. 50 tablet 0  . amitriptyline (ELAVIL) 50 MG tablet Take 1 tablet by mouth at bedtime.  1  . carvedilol (COREG) 12.5 MG tablet Take 1 tablet (12.5 mg total) by mouth 2 (two) times daily. 60 tablet 11  . traZODone (DESYREL) 100 MG tablet Take 100 mg by mouth at bedtime as needed for sleep.     No current facility-administered medications for this visit.     Allergies  Allergen Reactions  . Lisinopril Other (See Comments)    Dry cough  . Vicodin [Hydrocodone-Acetaminophen] Nausea Only    Social History   Socioeconomic History  . Marital status: Married    Spouse name: Not on file  . Number of children: Not on file  . Years of education: Not on file  . Highest education level: Not on file  Social Needs  . Financial resource strain: Not on file  . Food insecurity - worry: Not on file  . Food insecurity - inability: Not on file  . Transportation needs - medical: Not on file  . Transportation needs - non-medical: Not on file  Occupational History  . Not on file  Tobacco Use  . Smoking status: Never Smoker  . Smokeless tobacco: Never Used  Substance and Sexual Activity  . Alcohol use: No  . Drug use: No  . Sexual activity: Yes    Other Topics Concern  . Not on file  Social History Narrative  . Not on file    Family History  Problem Relation Age of Onset  . Hypertension Mother   . Breast cancer Mother   . Depression Sister   . Diabetes Paternal Grandmother   . Stroke Paternal Grandmother   . Breast cancer Maternal Aunt   . Prostate cancer Maternal Uncle   . Prostate cancer Maternal Uncle     Review of Systems:  As stated in the HPI and otherwise negative.   BP (!) 170/90   Pulse 68   Ht 5\' 6"  (1.676 m)   Wt 248 lb (112.5 kg)   SpO2 98%   BMI 40.03 kg/m   Physical Examination:  General: Well developed, well nourished, NAD  HEENT: OP clear, mucus membranes moist  SKIN: warm, dry. No rashes. Neuro: No focal deficits  Musculoskeletal: Muscle strength 5/5 all ext  Psychiatric: Mood and affect normal  Neck: No JVD, no carotid bruits, no thyromegaly, no lymphadenopathy.  Lungs:Clear bilaterally, no wheezes, rhonci, crackles Cardiovascular: Regular rate and rhythm. No murmurs, gallops or rubs. Abdomen:Soft. Bowel sounds present. Non-tender.  Extremities: No lower extremity edema. Pulses are 2 + in the bilateral DP/PT.  Echo June 2018:  - Left ventricle: The cavity size was normal. Wall thickness was   normal. Systolic function was normal. The estimated ejection   fraction was in the range of 55% to 60%. Wall motion was normal;   there were no regional wall motion abnormalities. Doppler   parameters are consistent with abnormal left ventricular   relaxation (grade 1 diastolic dysfunction). The E/e&' ratio is   between 8-15, suggesting indeterminate LV filling pressure. - Aorta: Ascending aortic diameter: 40 mm (S). - Ascending aorta: The ascending aorta was mildly dilated. - Mitral valve: Mildly thickened leaflets . There was trivial   regurgitation. - Left atrium: The atrium was normal in size. - Right atrium: The atrium was mildly dilated. - Tricuspid valve: There was mild regurgitation. -  Pulmonary arteries: PA peak pressure: 28 mm Hg (S). - Inferior vena cava: The vessel was normal in size. The   respirophasic diameter changes were in the normal range (>= 50%),   consistent with normal central venous pressure.  Impressions:  - Compared to a prior study in 2015, the LVEF is stable at 55-60%.   The ascending aorta is slightly larger at 4.0 cm.  EKG:  EKG is not  ordered today.  Recent Labs: 03/04/2017: ALT 12 03/11/2017: BUN 13; Creatinine, Ser 0.75; Potassium 4.2; Sodium 136 03/12/2017: Hemoglobin 9.1; Platelets 197   Lipid Panel    Component Value Date/Time   CHOL 137 01/24/2016 1149   TRIG 68 01/24/2016 1149   HDL 45 01/24/2016 1149   CHOLHDL 3 05/07/2013 1057   VLDL 6.0 05/07/2013 1057   LDLCALC 78 01/24/2016 1149     Wt Readings from Last 3 Encounters:  05/07/17 248 lb (112.5 kg)  04/07/17 251 lb (113.9 kg)  03/04/17 250 lb 14.1 oz (113.8 kg)     Other studies Reviewed: Additional studies/ records that were reviewed today include: none  Assessment and Plan:   1. Non-ischemic cardiomyopathy: Likely Takotsubo cardiomyopathy given presentation in November 2014. Her LV function has returned to normal. Will continue medical therapy with a beta blocker and ARB.    2. HTN: BP is elevated and was elevated at prior primary care visit. Will increase Coreg to 12.5 mg po BID. Continue Cozaar.    3. Thoracic aortic aneurysm: Stable mild dilatation of the ascending aorta at 4.1 cm by CTA June 2018. Will continue annual follow up based on current guidelines.    Current medicines are reviewed at length with the patient today.  The patient does not have concerns regarding medicines.  The following changes have been made:  no change  Labs/ tests ordered today include: None  Orders Placed This Encounter  Procedures  . CT ANGIO CHEST AORTA W &/OR WO CONTRAST  . Basic Metabolic Panel (BMET)    Disposition:   FU with me in 12 months  Signed, Verne Carrow, MD 05/07/2017 11:33 AM    [facility] [address] [place], [place], [place]  [address] Phone: [phone]; Fax: [phone]

## 2017-08-26 ENCOUNTER — Other Ambulatory Visit: Payer: Medicare HMO | Admitting: *Deleted

## 2017-08-26 DIAGNOSIS — I712 Thoracic aortic aneurysm, without rupture, unspecified: Secondary | ICD-10-CM

## 2017-08-26 LAB — BASIC METABOLIC PANEL
BUN/Creatinine Ratio: 16 (ref 9–23)
BUN: 13 mg/dL (ref 6–24)
CALCIUM: 8.7 mg/dL (ref 8.7–10.2)
CHLORIDE: 104 mmol/L (ref 96–106)
CO2: 24 mmol/L (ref 20–29)
Creatinine, Ser: 0.83 mg/dL (ref 0.57–1.00)
GFR calc Af Amer: 92 mL/min/{1.73_m2} (ref >59)
GFR calc non Af Amer: 80 mL/min/{1.73_m2} (ref >59)
GLUCOSE: 87 mg/dL (ref 65–99)
Potassium: 4.1 mmol/L (ref 3.5–5.2)
Sodium: 139 mmol/L (ref 134–144)

## 2017-09-02 ENCOUNTER — Other Ambulatory Visit: Payer: Self-pay

## 2017-09-03 ENCOUNTER — Ambulatory Visit (INDEPENDENT_AMBULATORY_CARE_PROVIDER_SITE_OTHER): Payer: Medicare Other | Admitting: Internal Medicine

## 2017-09-03 ENCOUNTER — Encounter: Payer: Self-pay | Admitting: Internal Medicine

## 2017-09-03 VITALS — BP 124/82 | HR 70 | Resp 12 | Ht 66.0 in | Wt 244.0 lb

## 2017-09-03 DIAGNOSIS — N644 Mastodynia: Secondary | ICD-10-CM

## 2017-09-03 NOTE — Progress Notes (Signed)
   Subjective:    Patient ID: Deborah Williamson, female    DOB: 07/12/1960, 57 y.o.   MRN: [id number]  HPI   Right sided inferior breast pain for past 2 weeks.  Constant discomfort.  Very tender and burning when she presses against it.  She does wear an underwire bra.  No new bras. This is a similar type of pain she would have before a period, but this is much more intense.  Her LMP was 01/06/2017.  Current Meds  Medication Sig  . acetaminophen (TYLENOL) 500 MG tablet Take 1,000 mg by mouth 2 (two) times daily as needed for moderate pain or headache.  Marland Kitchen. aspirin EC 81 MG tablet Take 81 mg by mouth daily.  . busPIRone (BUSPAR) 15 MG tablet 15 mg 3 (three) times daily.   Marland Kitchen. lithium carbonate 300 MG capsule 300 mg. 1 daily at bedtime  . losartan (COZAAR) 100 MG tablet TAKE 1 TABLET (100 MG TOTAL) BY MOUTH DAILY.  . Multiple Vitamin (MULTIVITAMIN WITH MINERALS) TABS tablet Take 1 tablet by mouth daily.  . Multiple Vitamins-Minerals (HAIR SKIN AND NAILS FORMULA) TABS Take 1 tablet by mouth daily.  . prazosin (MINIPRESS) 1 MG capsule 1 mg at bedtime.   Marland Kitchen. REXULTI 2 MG TABS daily.   . rosuvastatin (CRESTOR) 10 MG tablet TAKE 1 TABLET BY MOUTH DAILY  . sertraline (ZOLOFT) 100 MG tablet Take 100 mg by mouth. 2 tablets daily in the morning  . traZODone (DESYREL) 100 MG tablet Take 100 mg by mouth. 3 tablets at bedtime   Allergies  Allergen Reactions  . Lisinopril Other (See Comments)    Dry cough  . Vicodin [Hydrocodone-Acetaminophen] Nausea Only       Review of Systems     Objective:   Physical Exam NAD Breasts:  Mild nodularity in right upper lateral breast area into anterior axillary area.  Minimal tenderness over this area.  The area in her inferior breast is soft, but exquisitely tender.  No focal mass bilaterally, skin dimpling, nipple discharge or axillary adenopathy       Assessment & Plan:  1.  Right Breast pain:  Bilateral mammogram.  2.   HM:  Discussed with patient  would recommend yearly mammogram at her age as well as yearly CPE.  Scheduling the latter.

## 2017-09-05 ENCOUNTER — Other Ambulatory Visit: Payer: Self-pay | Admitting: Internal Medicine

## 2017-09-05 DIAGNOSIS — N644 Mastodynia: Secondary | ICD-10-CM

## 2017-09-09 ENCOUNTER — Ambulatory Visit: Payer: Self-pay

## 2017-09-09 ENCOUNTER — Ambulatory Visit
Admission: RE | Admit: 2017-09-09 | Discharge: 2017-09-09 | Disposition: A | Discharge status: Home / Self Care | Payer: Medicare HMO | Source: Ambulatory Visit | Attending: Internal Medicine | Admitting: Internal Medicine

## 2017-09-09 DIAGNOSIS — N644 Mastodynia: Secondary | ICD-10-CM

## 2017-09-16 ENCOUNTER — Other Ambulatory Visit: Payer: Self-pay

## 2017-09-17 ENCOUNTER — Telehealth: Payer: Self-pay

## 2017-09-17 ENCOUNTER — Ambulatory Visit (INDEPENDENT_AMBULATORY_CARE_PROVIDER_SITE_OTHER)
Admission: RE | Admit: 2017-09-17 | Discharge: 2017-09-17 | Disposition: A | Discharge status: Home / Self Care | Payer: Medicare HMO | Source: Ambulatory Visit | Attending: Cardiovascular Disease | Admitting: Cardiovascular Disease

## 2017-09-17 DIAGNOSIS — I712 Thoracic aortic aneurysm, without rupture, unspecified: Secondary | ICD-10-CM

## 2017-09-17 MED ORDER — IOPAMIDOL (ISOVUE-370) INJECTION 76%
100.0000 mL | Freq: Once | INTRAVENOUS | Status: AC | PRN
  Administered 2017-09-17: 100 mL via INTRAVENOUS

## 2017-09-17 NOTE — Telephone Encounter (Signed)
She can make an appt to come back and reassess, but we don't do an ultrasound unless there is something found on mammogram they feel would be seen better on Ultrasound.  The mammogram was normal, so they would not order an ultrasound to further evaluate.  In other words, they did not find anything that would require an ultrasound to see it better.

## 2017-09-17 NOTE — Telephone Encounter (Signed)
Patient called stating she had a mammogram done due to her right breast burning. Patient states they informed her the mammogram was normal and not further imaging needed to be done. Patient states she is still having burning in her right breast and she was upset that they did not do a ultrasound of her breast. Patient wants to know what she should do.  To Dr. Delrae AlfredMulberry for further direction.

## 2017-09-23 NOTE — Telephone Encounter (Signed)
Spoke with patient. Changed CPE to 10/15/17 patient requested appointment be changed to something earlier than sept. Patient will also discuss continual breast issues at that time.

## 2017-10-15 ENCOUNTER — Ambulatory Visit (INDEPENDENT_AMBULATORY_CARE_PROVIDER_SITE_OTHER): Payer: Medicare HMO | Admitting: Internal Medicine

## 2017-10-15 ENCOUNTER — Encounter: Payer: Self-pay | Admitting: Internal Medicine

## 2017-10-15 VITALS — BP 126/86 | HR 70 | Resp 12 | Ht 66.0 in | Wt 250.0 lb

## 2017-10-15 DIAGNOSIS — Z0000 Encounter for general adult medical examination without abnormal findings: Secondary | ICD-10-CM

## 2017-10-15 DIAGNOSIS — H9193 Unspecified hearing loss, bilateral: Secondary | ICD-10-CM

## 2017-10-15 DIAGNOSIS — Z124 Encounter for screening for malignant neoplasm of cervix: Secondary | ICD-10-CM

## 2017-10-15 DIAGNOSIS — Z1211 Encounter for screening for malignant neoplasm of colon: Secondary | ICD-10-CM

## 2017-10-15 NOTE — Progress Notes (Signed)
Subjective:    Patient ID: Deborah Williamson, female    DOB: Oct 26, 1960, 57 y.o.   MRN: [id number]  HPI   CPE with pap  1.  Pap:  Last pap 09/2012.  Always normal.  No family history of cervical cancer.  2.  Mammogram:  Just performed on 09/09/17 due to left chest pain.  Pain resolved and mammogram normal.  Mother and 2 maternal aunts 1st maternal cousin.  2 maternal uncles with prostate cancer.  1/2 sister on father's side with breast cancer diagnosed at age 76.  Mother diagnosed in her mid 86s.  Mom is still living.  Maternal aunt died in 41s of other causes.  Another maternal aunt diagnosed in 17s and died of either complications of treatment or from the cancer itself. Rest of relatives with history of cancer still living. Sister age 41 without history of breast cancer.  3.  Osteoprevention:  Drinks milk with cereal only once daily and not 8 oz.  Would be willing to get in 3 servings daily[place] [facility] and [facility].  Also doing weights.    4.  Guaiac Cards:  Never.  No family history of colon cancer.  5.  Colonoscopy: Never.  6.  Immunizations:   Immunization History  Administered Date(s) Administered  . Influenza Inj Mdck Quad Pf 01/02/2017  . Influenza-Unspecified 02/10/2017  . Tdap 08/29/2008    7.  Glucose/Cholesterol:  Blood glucose has been intermittently mildly high  Do not see a lipid check since 01/2016.  Current Meds  Medication Sig  . acetaminophen (TYLENOL) 500 MG tablet Take 1,000 mg by mouth 2 (two) times daily as needed for moderate pain or headache.  Marland Kitchen amitriptyline (ELAVIL) 50 MG tablet Take 1 tablet by mouth at bedtime.  Marland Kitchen aspirin EC 81 MG tablet Take 81 mg by mouth daily.  . busPIRone (BUSPAR) 15 MG tablet 15 mg 3 (three) times daily.   Marland Kitchen lithium carbonate 300 MG capsule 300 mg. 1 daily at bedtime  . losartan (COZAAR) 100 MG tablet TAKE 1 TABLET (100 MG TOTAL) BY MOUTH DAILY.  . Melatonin 5 MG TABS Take 15 mg by mouth at bedtime as needed  (sleep).  . Multiple Vitamin (MULTIVITAMIN WITH MINERALS) TABS tablet Take 1 tablet by mouth daily.  . Multiple Vitamins-Minerals (HAIR SKIN AND NAILS FORMULA) TABS Take 1 tablet by mouth daily.  . ondansetron (ZOFRAN) 4 MG tablet Take 4 mg by mouth daily as needed for nausea or vomiting.   . prazosin (MINIPRESS) 1 MG capsule 1 mg at bedtime.   Marland Kitchen REXULTI 2 MG TABS daily.   . rosuvastatin (CRESTOR) 10 MG tablet TAKE 1 TABLET BY MOUTH DAILY  . sertraline (ZOLOFT) 100 MG tablet Take 100 mg by mouth. 2 tablets daily in the morning  . traZODone (DESYREL) 100 MG tablet Take 100 mg by mouth. 3 tablets at bedtime    Allergies  Allergen Reactions  . Lisinopril Other (See Comments)    Dry cough  . Vicodin [Hydrocodone-Acetaminophen] Nausea Only   Past Medical History:  Diagnosis Date  . Anxiety   . Ascending aortic aneurysm (HCC) 01/2013   3.5-4 cm noted on CT-A  . Bipolar II disorder, most recent episode major depressive (HCC)    [facility] at [facility].  Cline Crock, counselor:  [phone]  . DJD (degenerative joint disease) of knee 01/02/2017  . Dysrhythmia    "irregular" (02/17/2013)  . Headache(784.0)    "probably once/week" (02/17/2013)  .  Hyperlipidemia   . Hypertension   . Insomnia   . Left ovarian cyst 01/2013   Incidental CT finding  . Migraine    "probably once/week" (02/17/2013)  . Morbid obesity (HCC) 01/02/2017  . Normocytic anemia   . NSTEMI (non-ST elevated myocardial infarction) (HCC) 02/16/2013   Takotsubo cardiomyopathy, normal cors  . Obesity   . OCD (obsessive compulsive disorder)   . Plantar fasciitis of right foot   . PTSD (post-traumatic stress disorder)   . PTSD (post-traumatic stress disorder)   . Takotsubo cardiomyopathy 01/2013   a. 2D echo 02/17/13: EF 30-35%, periapical AK, normal RV size/function, moderate pulmonary HTN. c/w Takotsubo CM. ;  b.  Echo (04/2013): EF 55%, no WMA, Gr 1 DD, mildly dilated ascending aorta (39 mm), mild MR, mild BAE, PASP  33  . Trichomonas vaginitis 10/2014   Past Surgical History:  Procedure Laterality Date  . BRACHIOPLASTY Bilateral 05/2011   Plastic Surgery to remove loose skin after Weight Loss  . CARDIAC CATHETERIZATION  02/17/2013   no angiographic evidence of CAD, EF 35%, HK or the anteroapical wall, apex and inferoapical wall  . FOOT SURGERY Right   . KNEE ARTHROSCOPY Right 02/05/2017   [facility]  . LEFT HEART CATHETERIZATION WITH CORONARY ANGIOGRAM N/A 02/17/2013   Procedure: LEFT HEART CATHETERIZATION WITH CORONARY ANGIOGRAM;  Surgeon: Kathleene Hazelhristopher D McAlhany, MD;  [facility];  Service: Cardiovascular;  Laterality: N/A;  . MASS EXCISION Right 02/29/2016   Procedure: EXCISION MASS, right wrist;  Surgeon: Betha LoaKevin Kuzma, MD;  Location: [facility];  Service: Orthopedics;  Laterality: Right;  EXCISION MASS, right wrist  . TOTAL KNEE ARTHROPLASTY Left 03/10/2017   Procedure: TOTAL KNEE ARTHROPLASTY;  Surgeon: Jodi GeraldsGraves, John, MD;  Location: [facility];  Service: Orthopedics;  Laterality: Left;  . TUBAL LIGATION  1989   Family History  Problem Relation Age of Onset  . Hypertension Mother   . Breast cancer Mother [id number]  . Depression Sister   . Diabetes Paternal Grandmother   . Stroke Paternal Grandmother   . Prostate cancer Maternal Uncle   . Prostate cancer Maternal Uncle   . Breast cancer Cousin 30  . Breast cancer Maternal Aunt 70  . Breast cancer Maternal Aunt 30   Social History   Socioeconomic History  . Marital status: Legally Separated    Spouse name: Not on file  . Number of children: 2  . Years of education: Not on file  . Highest education level: Not on file  Occupational History  . Occupation: unemployed/disability  Social Needs  . Financial resource strain: Not on file  . Food insecurity:    Worry: Not on file    Inability: Not on file  . Transportation needs:    Medical: Not on file    Non-medical: Not on file  Tobacco Use  . Smoking status: Never  Smoker  . Smokeless tobacco: Never Used  Substance and Sexual Activity  . Alcohol use: No  . Drug use: No  . Sexual activity: Yes  Lifestyle  . Physical activity:    Days per week: Not on file    Minutes per session: Not on file  . Stress: Not on file  Relationships  . Social connections:    Talks on phone: Not on file    Gets together: Not on file    Attends religious service: Not on file    Active member of club or organization: Not on file    Attends meetings  of clubs or organizations: Not on file    Relationship status: Not on file  . Intimate partner violence:    Fear of current or ex partner: Not on file    Emotionally abused: Not on file    Physically abused: Not on file    Forced sexual activity: Not on file  Other Topics Concern  . 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 Negative for appetite change and unexpected weight change.  HENT: Positive for hearing loss (listening to the TV on loud). Negative for dental problem.   Eyes: Positive for visual disturbance (Bifocals work well for her.  Just had recheck and new Rx 3 weeks ago.).  Respiratory: Negative for cough and shortness of breath.   Cardiovascular: Positive for leg swelling (Left foot swells with long term sitting .    Did eat pork skins.  ). Negative for chest pain and palpitations.  Gastrointestinal: Negative for abdominal pain, blood in stool (no melena), constipation and diarrhea.  Genitourinary: Negative for dysuria, frequency and vaginal discharge.  Musculoskeletal: Negative for arthralgias and joint swelling.  Neurological: Negative for weakness and numbness.  Psychiatric/Behavioral: Positive for dysphoric mood (Working on her depression with [facility]).       Objective:   Physical Exam  Constitutional:  She is oriented to person, place, and time. She appears well-developed and well-nourished.  HENT:  Head: Normocephalic and atraumatic.  Right Ear: Tympanic membrane, external ear and ear canal normal.  Left Ear: Tympanic membrane, external ear and ear canal normal.  Nose: Nose normal.  Mouth/Throat: Oropharynx is clear and moist and mucous membranes are normal.  Eyes: Pupils are equal, round, and reactive to light. Conjunctivae and EOM are normal.  Discs sharp bilaterally.  Neck: Normal range of motion and full passive range of motion without pain. Neck supple. No thyromegaly present.  Cardiovascular: Normal rate, regular rhythm, S1 normal and S2 normal. Exam reveals no S3, no S4 and no friction rub.  No murmur heard. No carotid bruits.  Carotid, radial, femoral, DP and PT pulses normal and equal.  Varicosities of LE.    Pulmonary/Chest: Effort normal and breath sounds normal. Right breast exhibits no inverted nipple, no mass, no nipple discharge, no skin change and no tenderness. Left breast exhibits no inverted nipple, no mass, no nipple discharge, no skin change and no tenderness.  Abdominal: Soft. Bowel sounds are normal. She exhibits no mass. There is no hepatosplenomegaly. There is no tenderness. No hernia.  Genitourinary:  Genitourinary Comments: Normal external female genitalia.  No vaginal discharge.  No vaginal or cervical lesions.  Pap taken.  No uterine or adnexal mass or tenderness.   Rectal : no mass, heme negative light brown stool.    Musculoskeletal: Normal range of motion.  Lymphadenopathy:       Head (right side): No submental and no submandibular adenopathy present.       Head (left side): No submental and no submandibular adenopathy present.    She has no cervical adenopathy.    She has no axillary adenopathy.       Right: No inguinal and no supraclavicular adenopathy present.       Left: No inguinal and no supraclavicular adenopathy present.  Neurological: She is  alert and oriented to  person, place, and time. She has normal strength and normal reflexes. No cranial nerve deficit or sensory deficit. Coordination and gait normal.  Skin: Skin is warm. Capillary refill takes less than 2 seconds. No rash noted.  Psychiatric: She has a normal mood and affect. Her speech is normal and behavior is normal. Judgment and thought content normal. Cognition and memory are normal.          Assessment & Plan:  1.  CPE with pap Guaiac cards X 3 to return in 2 weeks. Mammogram complete and normal Referral for screening colonoscopy Flu shot in the fall.  2.  Decreased hearing:  AIM/audiology referral

## 2017-10-15 NOTE — Patient Instructions (Addendum)
Drink a glass of water before every meal Drink 6-8 glasses of water daily Eat three meals daily Eat a protein and healthy fat with every meal (eggs,fish, chicken, turkey and limit red meats) Eat 5 servings of vegetables daily, mix the colors Eat 2 servings of fruit daily with skin, if skin is edible Use smaller plates Put food/utensils down as you chew and swallow each bite Eat at a table with friends/family at least once daily, no TV Do not eat in front of the TV  Recent studies show that people who consume all of their calories in a 12 hour period lose weight more efficiently.  For example, if you eat your first meal at 7:00 a.m., your last meal of the day should be completed by 7:00 p.m.  Can google "advance directives, [place]"  And bring up form from Secretary of State. Print and fill out Or can go to "5 wishes"  Which is also in Spanish and fill out--this costs $5--perhaps easier to use. Designate a Medical Power of Attorney to speak for you if you are unable to speak for yourself when ill or injured  

## 2017-10-18 LAB — CYTOLOGY - PAP

## 2017-10-22 ENCOUNTER — Other Ambulatory Visit: Payer: Medicare Other

## 2017-10-24 ENCOUNTER — Other Ambulatory Visit (INDEPENDENT_AMBULATORY_CARE_PROVIDER_SITE_OTHER): Payer: Medicare Other

## 2017-10-24 DIAGNOSIS — Z79899 Other long term (current) drug therapy: Secondary | ICD-10-CM

## 2017-10-24 DIAGNOSIS — E039 Hypothyroidism, unspecified: Secondary | ICD-10-CM

## 2017-10-24 DIAGNOSIS — E782 Mixed hyperlipidemia: Secondary | ICD-10-CM | POA: Diagnosis not present

## 2017-10-25 LAB — LIPID PANEL W/O CHOL/HDL RATIO
CHOLESTEROL TOTAL: 120 mg/dL (ref 100–199)
HDL: 47 mg/dL (ref >39)
LDL Calculated: 64 mg/dL (ref 0–99)
Triglycerides: 45 mg/dL (ref 0–149)
VLDL Cholesterol Cal: 9 mg/dL (ref 5–40)

## 2017-10-25 LAB — CBC WITH DIFFERENTIAL/PLATELET
Basophils Absolute: 0 10*3/uL (ref 0.0–0.2)
Basos: 1 %
EOS (ABSOLUTE): 0.1 10*3/uL (ref 0.0–0.4)
EOS: 3 %
HEMATOCRIT: 37.2 % (ref 34.0–46.6)
Hemoglobin: 11.3 g/dL (ref 11.1–15.9)
Immature Grans (Abs): 0 10*3/uL (ref 0.0–0.1)
Immature Granulocytes: 0 %
LYMPHS: 37 %
Lymphocytes Absolute: 1.5 10*3/uL (ref 0.7–3.1)
MCH: 26.4 pg — ABNORMAL LOW (ref 26.6–33.0)
MCHC: 30.4 g/dL — ABNORMAL LOW (ref 31.5–35.7)
MCV: 87 fL (ref 79–97)
Monocytes Absolute: 0.3 10*3/uL (ref 0.1–0.9)
Monocytes: 7 %
NEUTROS PCT: 52 %
Neutrophils Absolute: 2.1 10*3/uL (ref 1.4–7.0)
Platelets: 261 10*3/uL (ref 150–450)
RBC: 4.28 x10E6/uL (ref 3.77–5.28)
RDW: 13.1 % (ref 12.3–15.4)
WBC: 4.1 10*3/uL (ref 3.4–10.8)

## 2017-10-25 LAB — COMPREHENSIVE METABOLIC PANEL
A/G RATIO: 1.3 (ref 1.2–2.2)
ALT: 9 IU/L (ref 0–32)
AST: 16 IU/L (ref 0–40)
Albumin: 3.7 g/dL (ref 3.5–5.5)
Alkaline Phosphatase: 73 IU/L (ref 39–117)
BUN/Creatinine Ratio: 14 (ref 9–23)
BUN: 11 mg/dL (ref 6–24)
Bilirubin Total: 0.4 mg/dL (ref 0.0–1.2)
CALCIUM: 8.9 mg/dL (ref 8.7–10.2)
CO2: 22 mmol/L (ref 20–29)
CREATININE: 0.78 mg/dL (ref 0.57–1.00)
Chloride: 105 mmol/L (ref 96–106)
GFR, EST AFRICAN AMERICAN: 99 mL/min/{1.73_m2} (ref >59)
GFR, EST NON AFRICAN AMERICAN: 86 mL/min/{1.73_m2} (ref >59)
GLOBULIN, TOTAL: 2.9 g/dL (ref 1.5–4.5)
Glucose: 86 mg/dL (ref 65–99)
POTASSIUM: 4.5 mmol/L (ref 3.5–5.2)
Sodium: 139 mmol/L (ref 134–144)
TOTAL PROTEIN: 6.6 g/dL (ref 6.0–8.5)

## 2017-10-25 LAB — TSH: TSH: 1.56 u[IU]/mL (ref 0.450–4.500)

## 2017-12-05 ENCOUNTER — Encounter: Payer: Medicare Other | Admitting: Internal Medicine

## 2017-12-12 ENCOUNTER — Other Ambulatory Visit: Payer: Self-pay | Admitting: Internal Medicine

## 2018-02-16 ENCOUNTER — Other Ambulatory Visit: Payer: Self-pay | Admitting: Internal Medicine

## 2018-02-16 NOTE — Telephone Encounter (Signed)
Patient needs Rx refill CRESTOR 10 mg

## 2018-05-13 ENCOUNTER — Other Ambulatory Visit: Payer: Self-pay | Admitting: Cardiovascular Disease

## 2018-05-20 ENCOUNTER — Ambulatory Visit: Payer: Medicare HMO | Admitting: Cardiovascular Disease

## 2018-05-20 ENCOUNTER — Encounter: Payer: Self-pay | Admitting: Cardiovascular Disease

## 2018-05-20 VITALS — BP 160/90 | HR 57 | Ht 66.0 in | Wt 252.4 lb

## 2018-05-20 DIAGNOSIS — I712 Thoracic aortic aneurysm, without rupture, unspecified: Secondary | ICD-10-CM

## 2018-05-20 DIAGNOSIS — I10 Essential (primary) hypertension: Secondary | ICD-10-CM

## 2018-05-20 DIAGNOSIS — I5181 Takotsubo syndrome: Secondary | ICD-10-CM | POA: Diagnosis not present

## 2018-05-20 MED ORDER — CARVEDILOL 12.5 MG PO TABS: 12.5000 mg | ORAL_TABLET | Freq: Two times a day (BID) | ORAL | 3 refills | Status: DC

## 2018-05-20 NOTE — Progress Notes (Signed)
Chief Complaint  Patient presents with  . Follow-up    HTN   History of Present Illness: 58 yo female with a history of thoracic aortic aneurysm, HTN and a non-ischemic cardiomyopathy who is here today for cardiac follow up.  She was admitted to [facility] November 2014 with chest pain and elevated cardiac enzymes. Cardiac cath 02/16/2013 with no angiographic evidence of CAD, LVEF 35% with anteroapical, apical and inferoapical HK. Echocardiogram in 2014 with LVEF= 30-35%. grade 1 diastolic dysfunction, trivial MR. Chest/abdominal/pelvic CTA in 2014 showed no aortic dissection, mild aneurysmal dilatation of the ascending thoracic aorta, no pulmonary embolism. She was felt to have a stress induced cardiomyopathy. She was placed on a beta blocker and ACEI. Echo 05/07/13 with normal LV systolic function. Last echo June 2018 with normal LV systolic function, grade 1 diastolic dysfunction. Chest CTA June 2019 with stable 4.0 cm aneurysm of the ascending aorta.   She is here today for follow up. The patient denies any chest pain, dyspnea, palpitations, lower extremity edema, orthopnea, PND, dizziness, near syncope or syncope.   Primary Care Physician: Julieanne Manson, MD   Past Medical History:  Diagnosis Date  . Anxiety   . Ascending aortic aneurysm (HCC) 01/2013   3.5-4 cm noted on CT-A  . Bipolar II disorder, most recent episode major depressive (HCC)    Followed at [facility]  Cline Crock, counselor:  [phone]  . DJD (degenerative joint disease) of knee 01/02/2017  . Dysrhythmia    "irregular" (02/17/2013)  . Headache(784.0)    "probably once/week" (02/17/2013)  . Hyperlipidemia   . Hypertension   . Insomnia   . Left ovarian cyst 01/2013   Incidental CT finding  . Migraine    "probably once/week" (02/17/2013)  . Morbid obesity (HCC) 01/02/2017  . Normocytic anemia   . NSTEMI (non-ST elevated myocardial infarction) (HCC) 02/16/2013   Takotsubo cardiomyopathy, normal cors  .  Obesity   . OCD (obsessive compulsive disorder)   . Plantar fasciitis of right foot   . PTSD (post-traumatic stress disorder)   . PTSD (post-traumatic stress disorder)   . Takotsubo cardiomyopathy 01/2013   a. 2D echo 02/17/13: EF 30-35%, periapical AK, normal RV size/function, moderate pulmonary HTN. c/w Takotsubo CM. ;  b.  Echo (04/2013): EF 55%, no WMA, Gr 1 DD, mildly dilated ascending aorta (39 mm), mild MR, mild BAE, PASP 33  . Trichomonas vaginitis 10/2014    Past Surgical History:  Procedure Laterality Date  . BRACHIOPLASTY Bilateral 05/2011   Plastic Surgery to remove loose skin after Weight Loss  . CARDIAC CATHETERIZATION  02/17/2013   no angiographic evidence of CAD, EF 35%, HK or the anteroapical wall, apex and inferoapical wall  . FOOT SURGERY Right   . KNEE ARTHROSCOPY Right 02/05/2017   [facility]  . LEFT HEART CATHETERIZATION WITH CORONARY ANGIOGRAM N/A 02/17/2013   Procedure: LEFT HEART CATHETERIZATION WITH CORONARY ANGIOGRAM;  Surgeon: Kathleene Hazel, MD;  Location[facility];  Service: Cardiovascular;  Laterality: N/A;  . MASS EXCISION Right 02/29/2016   Procedure: EXCISION MASS, right wrist;  Surgeon: Betha Loa, MD;  Location: [facility];  Service: Orthopedics;  Laterality: Right;  EXCISION MASS, right wrist  . TOTAL KNEE ARTHROPLASTY Left 03/10/2017   Procedure: TOTAL KNEE ARTHROPLASTY;  Surgeon: Jodi Geralds, MD;  Location: [facility];  Service: Orthopedics;  Laterality: Left;  . TUBAL LIGATION  1989    Current Outpatient Medications  Medication Sig Dispense Refill  .  acetaminophen (TYLENOL) 500 MG tablet Take 1,000 mg by mouth 2 (two) times daily as needed for moderate pain or headache.    Marland Kitchen amitriptyline (ELAVIL) 50 MG tablet Take 1 tablet by mouth at bedtime.  1  . aspirin EC 81 MG tablet Take 81 mg by mouth daily.    . busPIRone (BUSPAR) 15 MG tablet 15 mg 3 (three) times daily.     . carvedilol (COREG) 12.5 MG tablet Take 1  tablet (12.5 mg total) by mouth 2 (two) times daily. 180 tablet 3  . docusate sodium (COLACE) 100 MG capsule Take 1 capsule (100 mg total) by mouth 2 (two) times daily. 30 capsule 0  . lithium carbonate 300 MG capsule 300 mg. 1 daily at bedtime    . losartan (COZAAR) 100 MG tablet TAKE 1 TABLET (100 MG TOTAL) BY MOUTH DAILY. 30 tablet 10  . Melatonin 5 MG TABS Take 15 mg by mouth at bedtime as needed (sleep).    . Multiple Vitamin (MULTIVITAMIN WITH MINERALS) TABS tablet Take 1 tablet by mouth daily.    . Multiple Vitamins-Minerals (HAIR SKIN AND NAILS FORMULA) TABS Take 1 tablet by mouth daily.    . ondansetron (ZOFRAN) 4 MG tablet Take 4 mg by mouth daily as needed for nausea or vomiting.   0  . prazosin (MINIPRESS) 1 MG capsule 1 mg at bedtime.     Marland Kitchen REXULTI 2 MG TABS daily.     . rosuvastatin (CRESTOR) 10 MG tablet TAKE 1 TABLET BY MOUTH DAILY 30 tablet 11  . sertraline (ZOLOFT) 100 MG tablet Take 100 mg by mouth. 2 tablets daily in the morning    . traZODone (DESYREL) 100 MG tablet Take 100 mg by mouth. 3 tablets at bedtime     No current facility-administered medications for this visit.     Allergies  Allergen Reactions  . Lisinopril Other (See Comments)    Dry cough  . Vicodin [Hydrocodone-Acetaminophen] Nausea Only    Social History   Socioeconomic History  . Marital status: Legally Separated    Spouse name: Not on file  . Number of children: 2  . Years of education: Not on file  . Highest education level: Not on file  Occupational History  . Occupation: unemployed/disability  Social Needs  . Financial resource strain: Not on file  . Food insecurity:    Worry: Not on file    Inability: Not on file  . Transportation needs:    Medical: Not on file    Non-medical: Not on file  Tobacco Use  . Smoking status: Never Smoker  . Smokeless tobacco: Never Used  Substance and Sexual Activity  . Alcohol use: No  . Drug use: No  . Sexual activity: Yes  Lifestyle  .  Physical activity:    Days per week: Not on file    Minutes per session: Not on file  . Stress: Not on file  Relationships  . Social connections:    Talks on phone: Not on file    Gets together: Not on file    Attends religious service: Not on file    Active member of club or organization: Not on file    Attends meetings of clubs or organizations: Not on file    Relationship status: Not on file  . Intimate partner violence:    Fear of current or ex partner: Not on file    Emotionally abused: Not on file    Physically abused: Not on file  Forced sexual activity: Not on file  Other Topics Concern  . 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 Hypertension Mother   . Breast cancer Mother 70  . Depression Sister   . Diabetes Paternal Grandmother   . Stroke Paternal Grandmother   . Prostate cancer Maternal Uncle   . Prostate cancer Maternal Uncle   . Breast cancer Cousin 30  . Breast cancer Maternal Aunt 70  . Breast cancer Maternal Aunt 30    Review of Systems:  As stated in the HPI and otherwise negative.   BP (!) 160/90   Pulse (!) 57   Ht  (1.676 m)   Wt 252 lb 6.4 oz (114.5 kg)   SpO2 94%   BMI 40.74 kg/m   Physical Examination:  General: Well developed, well nourished, NAD  HEENT: OP clear, mucus membranes moist  SKIN: warm, dry. No rashes. Neuro: No focal deficits  Musculoskeletal: Muscle strength 5/5 all ext  Psychiatric: Mood and affect normal  Neck: No JVD, no carotid bruits, no thyromegaly, no lymphadenopathy.  Lungs:Clear bilaterally, no wheezes, rhonci, crackles Cardiovascular: Regular rate and rhythm. No murmurs, gallops or rubs. Abdomen:Soft. Bowel sounds present. Non-tender.  Extremities: No lower extremity edema. Pulses are 2 + in the bilateral DP/PT.  Echo June 2018:  - Left ventricle: The  cavity size was normal. Wall thickness was   normal. Systolic function was normal. The estimated ejection   fraction was in the range of 55% to 60%. Wall motion was normal;   there were no regional wall motion abnormalities. Doppler   parameters are consistent with abnormal left ventricular   relaxation (grade 1 diastolic dysfunction). The E/e&' ratio is   between 8-15, suggesting indeterminate LV filling pressure. - Aorta: Ascending aortic diameter: 40 mm (S). - Ascending aorta: The ascending aorta was mildly dilated. - Mitral valve: Mildly thickened leaflets . There was trivial   regurgitation. - Left atrium: The atrium was normal in size. - Right atrium: The atrium was mildly dilated. - Tricuspid valve: There was mild regurgitation. - Pulmonary arteries: PA peak pressure: 28 mm Hg (S). - Inferior vena cava: The vessel was normal in size. The   respirophasic diameter changes were in the normal range (>= 50%),   consistent with normal central venous pressure.  Impressions:  - Compared to a prior study in 2015, the LVEF is stable at 55-60%.   The ascending aorta is slightly larger at 4.0 cm.  EKG:  EKG is ordered today. This demonstrates sinus brady, rate 57 bpm. 1st degree AV block  Recent Labs: 10/24/2017: ALT 9; BUN 11; Creatinine, Ser 0.78; Hemoglobin 11.3; Platelets 261; Potassium 4.5; Sodium 139; TSH 1.560   Lipid Panel    Component Value Date/Time   CHOL 120 10/24/2017 0855   TRIG 45 10/24/2017 0855   HDL 47 10/24/2017 0855   CHOLHDL 3 05/07/2013 1057   VLDL 6.0 05/07/2013 1057   LDLCALC 64 10/24/2017 0855     Wt Readings from Last 3 Encounters:  05/20/18 252 lb 6.4 oz (114.5 kg)  10/15/17 250 lb (113.4 kg)  09/03/17 244 lb (110.7 kg)     Other studies Reviewed: Additional studies/ records that were reviewed today include: none  Assessment and Plan:   1. Non-ischemic cardiomyopathy: Likely Takotsubo cardiomyopathy given presentation  in November 2014. Her LV  function has returned to normal. Echo June 2018 with LVEF=55-60%. Continue beta blocker and ARB therapy.     2. HTN: BP is elevated today. Will have her follow at home. If remains elevated, will add Norvasc 5 mg daily.   3. Thoracic aortic aneurysm: Stable mild dilatation of the ascending aorta at 4.0 cm June 2019. Unchanged from scan in 2014. Will repeat CTA every 2 years given stability over time.   Current medicines are reviewed at length with the patient today.  The patient does not have concerns regarding medicines.  The following changes have been made:  no change  Labs/ tests ordered today include: None  Orders Placed This Encounter  Procedures  . EKG 12-Lead    Disposition:   FU with me in 12 months  Signed, Verne Carrowhristopher McAlhany, MD 05/20/2018 10:18 AM    [facility] [address], [place], [place]  [address] Phone: [phone]; Fax: [phone]

## 2018-05-20 NOTE — Patient Instructions (Addendum)
Medication Instructions:  Your physician recommends that you continue on your current medications as directed. Please refer to the Current Medication list given to you today.  If you need a refill on your cardiac medications before your next appointment, please call your pharmacy.   Lab work: none If you have labs (blood work) drawn today and your tests are completely normal, you will receive your results only by: Marland Kitchen MyChart Message (if you have MyChart) OR . A paper copy in the mail If you have any lab test that is abnormal or we need to change your treatment, we will call you to review the results.  Testing/Procedures: none  Follow-Up: At [facility], you and your health needs are our priority.  As part of our continuing mission to provide you with exceptional heart care, we have created designated Provider Care Teams.  These Care Teams include your primary Cardiologist (physician) and Advanced Practice Providers (APPs -  Physician Assistants and Nurse Practitioners) who all work together to provide you with the care you need, when you need it. You will need a follow up appointment in 12 months.  Please call our office 4 months in advance to schedule this appointment.  You may see Verne Carrow, MD or one of the following Advanced Practice Providers on your designated Care Team:   [place], PA-C Ronie Spies, PA-C . Jacolyn Reedy, PA-C  Any Other Special Instructions Will Be Listed Below (If Applicable). Check BP and keep record of readings.  Call if top number consistently running greater than 140

## 2018-08-18 ENCOUNTER — Other Ambulatory Visit: Payer: Self-pay | Admitting: Internal Medicine

## 2018-08-18 DIAGNOSIS — Z1231 Encounter for screening mammogram for malignant neoplasm of breast: Secondary | ICD-10-CM

## 2018-09-19 ENCOUNTER — Ambulatory Visit
Admission: RE | Admit: 2018-09-19 | Discharge: 2018-09-19 | Disposition: A | Discharge status: Home / Self Care | Payer: Medicare HMO | Source: Ambulatory Visit | Attending: Internal Medicine | Admitting: Internal Medicine

## 2018-09-19 ENCOUNTER — Other Ambulatory Visit: Payer: Self-pay

## 2018-09-19 DIAGNOSIS — Z1231 Encounter for screening mammogram for malignant neoplasm of breast: Secondary | ICD-10-CM

## 2018-09-29 ENCOUNTER — Other Ambulatory Visit: Payer: Self-pay | Admitting: Cardiovascular Disease

## 2018-09-29 MED ORDER — ROSUVASTATIN CALCIUM 10 MG PO TABS: 10.0000 mg | ORAL_TABLET | Freq: Every day | ORAL | 2 refills | Status: DC

## 2018-09-29 MED ORDER — CARVEDILOL 12.5 MG PO TABS: 12.5000 mg | ORAL_TABLET | Freq: Two times a day (BID) | ORAL | 2 refills | Status: DC

## 2018-09-29 MED ORDER — LOSARTAN POTASSIUM 100 MG PO TABS: 100.0000 mg | ORAL_TABLET | Freq: Every day | ORAL | 2 refills | Status: DC

## 2018-12-01 ENCOUNTER — Encounter: Payer: Medicare HMO | Admitting: Internal Medicine

## 2019-01-15 ENCOUNTER — Encounter: Payer: Self-pay | Admitting: Internal Medicine

## 2019-01-15 ENCOUNTER — Ambulatory Visit (INDEPENDENT_AMBULATORY_CARE_PROVIDER_SITE_OTHER): Payer: Medicare HMO | Admitting: Internal Medicine

## 2019-01-15 ENCOUNTER — Other Ambulatory Visit: Payer: Self-pay

## 2019-01-15 VITALS — BP 132/88 | HR 80 | Resp 12 | Ht 66.0 in | Wt 255.0 lb

## 2019-01-15 DIAGNOSIS — D649 Anemia, unspecified: Secondary | ICD-10-CM

## 2019-01-15 DIAGNOSIS — Z23 Encounter for immunization: Secondary | ICD-10-CM | POA: Diagnosis not present

## 2019-01-15 DIAGNOSIS — I712 Thoracic aortic aneurysm, without rupture: Secondary | ICD-10-CM | POA: Diagnosis not present

## 2019-01-15 DIAGNOSIS — E782 Mixed hyperlipidemia: Secondary | ICD-10-CM | POA: Diagnosis not present

## 2019-01-15 DIAGNOSIS — Z0000 Encounter for general adult medical examination without abnormal findings: Secondary | ICD-10-CM

## 2019-01-15 DIAGNOSIS — I7121 Aneurysm of the ascending aorta, without rupture: Secondary | ICD-10-CM

## 2019-01-15 DIAGNOSIS — Z9229 Personal history of other drug therapy: Secondary | ICD-10-CM

## 2019-01-15 NOTE — Patient Instructions (Signed)
Can google "advance directives, [place]"  And bring up form from Secretary of State. Print and fill out Or can go to "5 wishes"  Which is also in Spanish and fill out--this costs $5--perhaps easier to use. Designate a Medical Power of Attorney to speak for you if you are unable to speak for yourself when ill or injured  

## 2019-01-15 NOTE — Progress Notes (Signed)
Subjective:    Patient ID: Deborah Williamson, female   DOB: 1961-01-07, 58 y.o.   MRN: [id number]   HPI   CPE without pap  1.  Pap:  Normal 09/2017.    2.  Mammogram:  Normal in 09/19/2018.  Mother with history of breast cancer at 4740+, 2 maternal aunts with breast cancer, she believes in their 5440s as well.  Her aunts are no longer living, but breast cancer was not cause of death.  Paternal half sister with breast cancer diagnosed in her 6350s and still living in her late 2250s.   She has opted out for genetic testing in the past.  She would not consider prophylactic mastectomies if she has a genetic predisposition.    3.  Osteoprevention:  No significant dairy intake.  Not much in way of weight bearing physical activity with pandemic.  4.  Guaiac Cards:  Had fecal immunochemical test 10/16/2018, which was negative.    5.  Colonoscopy:  Underwent screening colonoscopy with Dr. Presley RaddleKarki, [facility] on 01/01/2019 with findings of 15 mm polyp at hepatic flexure.  Otherwise normal.  She as not received path report yet.    6.  Immunizations: Immunization History  Administered Date(s) Administered  . Influenza Inj Mdck Quad Pf 01/02/2017  . Influenza-Unspecified 02/10/2017  . Tdap 08/29/2008     7.  Glucose/Cholesterol:  Blood glucose fine in past years. FLP at goal 10/2017 Lipid Panel     Component Value Date/Time   CHOL 120 10/24/2017 0855   TRIG 45 10/24/2017 0855   HDL 47 10/24/2017 0855   CHOLHDL 3 05/07/2013 1057   VLDL 6.0 05/07/2013 1057   LDLCALC 64 10/24/2017 0855   LABVLDL 9 10/24/2017 0855       Current Meds  Medication Sig  . acetaminophen (TYLENOL) 500 MG tablet Take 1,000 mg by mouth 2 (two) times daily as needed for moderate pain or headache.  . Ascorbic Acid (VITAMIN C ADULT GUMMIES PO) Take by mouth. 2 daily  . aspirin EC 81 MG tablet Take 81 mg by mouth daily.  . busPIRone (BUSPAR) 15 MG tablet 15 mg 3 (three) times daily.   . carvedilol (COREG) 12.5 MG tablet  Take 1 tablet (12.5 mg total) by mouth 2 (two) times daily.  Marland Kitchen. FIBER ADULT GUMMIES PO Take by mouth. 2 daily  . lithium carbonate 300 MG capsule 300 mg. 1 daily at bedtime  . losartan (COZAAR) 100 MG tablet Take 1 tablet (100 mg total) by mouth daily.  . Melatonin 5 MG TABS Take 15 mg by mouth at bedtime as needed (sleep).  . Multiple Vitamin (MULTIVITAMIN WITH MINERALS) TABS tablet Take 1 tablet by mouth daily.  . Multiple Vitamins-Minerals (HAIR SKIN AND NAILS FORMULA) TABS Take 1 tablet by mouth daily.  . prazosin (MINIPRESS) 2 MG capsule at bedtime.   Marland Kitchen. REXULTI 2 MG TABS daily.   . rosuvastatin (CRESTOR) 10 MG tablet Take 1 tablet (10 mg total) by mouth daily.  . sertraline (ZOLOFT) 100 MG tablet Take 100 mg by mouth. 2 tablets daily in the morning  . traZODone (DESYREL) 100 MG tablet Take 100 mg by mouth. 3 tablets at bedtime  . [DISCONTINUED] prazosin (MINIPRESS) 1 MG capsule 1 mg at bedtime.    Allergies  Allergen Reactions  . Lisinopril Other (See Comments)    Dry cough  . Vicodin [Hydrocodone-Acetaminophen] Nausea Only   Past Medical History:  Diagnosis Date  . Anxiety   . Ascending aortic  aneurysm (HCC) 01/2013   3.5-4 cm noted on CT-A  . Bipolar II disorder, most recent episode major depressive (HCC)    Followed at [facility]  Cline Crock, counselor:  [phone]  . DJD (degenerative joint disease) of knee 01/02/2017  . Dysrhythmia    "irregular" (02/17/2013)  . Headache(784.0)    "probably once/week" (02/17/2013)  . Hyperlipidemia   . Hypertension   . Insomnia   . Left ovarian cyst 01/2013   Incidental CT finding  . Migraine    "probably once/week" (02/17/2013)  . Morbid obesity (HCC) 01/02/2017  . Normocytic anemia   . NSTEMI (non-ST elevated myocardial infarction) (HCC) 02/16/2013   Takotsubo cardiomyopathy, normal cors  . Obesity   . OCD (obsessive compulsive disorder)   . Plantar fasciitis of right foot   . PTSD (post-traumatic stress disorder)   .  PTSD (post-traumatic stress disorder)   . PTSD (post-traumatic stress disorder)   . Takotsubo cardiomyopathy 01/2013   a. 2D echo 02/17/13: EF 30-35%, periapical AK, normal RV size/function, moderate pulmonary HTN. c/w Takotsubo CM. ;  b.  Echo (04/2013): EF 55%, no WMA, Gr 1 DD, mildly dilated ascending aorta (39 mm), mild MR, mild BAE, PASP 33  . Trichomonas vaginitis 10/2014    Past Surgical History:  Procedure Laterality Date  . BRACHIOPLASTY Bilateral 05/2011   Plastic Surgery to remove loose skin after Weight Loss  . CARDIAC CATHETERIZATION  02/17/2013   no angiographic evidence of CAD, EF 35%, HK or the anteroapical wall, apex and inferoapical wall  . FOOT SURGERY Right   . KNEE ARTHROSCOPY Right 02/05/2017   [facility]  . LEFT HEART CATHETERIZATION WITH CORONARY ANGIOGRAM N/A 02/17/2013   Procedure: LEFT HEART CATHETERIZATION WITH CORONARY ANGIOGRAM;  Surgeon: Kathleene Hazel, MD;  Location: [facility] CATH LAB;  Service: Cardiovascular;  Laterality: N/A;  . MASS EXCISION Right 02/29/2016   Procedure: EXCISION MASS, right wrist;  Surgeon: Betha Loa, MD;  Location:  SURGERY CENTER;  Service: Orthopedics;  Laterality: Right;  EXCISION MASS, right wrist  . TOTAL KNEE ARTHROPLASTY Left 03/10/2017   Procedure: TOTAL KNEE ARTHROPLASTY;  Surgeon: Jodi Geralds, MD;  Location: [facility] OR;  Service: Orthopedics;  Laterality: Left;  . TUBAL LIGATION  1989    Family History  Problem Relation Age of Onset  . Hypertension Mother   . Breast cancer Mother 62  . Depression Sister   . Diabetes Paternal Grandmother   . Stroke Paternal Grandmother   . Prostate cancer Maternal Uncle   . Prostate cancer Maternal Uncle   . Breast cancer Cousin 30  . Breast cancer Maternal Aunt 55  . Breast cancer Maternal Aunt 30  . Breast cancer Half-Sister 6       Breast cancer    Social History   Socioeconomic History  . Marital status: Legally Separated    Spouse name: Not on file  .  Number of children: 2  . Years of education: Not on file  . Highest education level: Bachelor's degree (e.g., BA, AB, BS)  Occupational History  . Occupation: unemployed/disability  Tobacco Use  . Smoking status: Never Smoker  . Smokeless tobacco: Never Used  Substance and Sexual Activity  . Alcohol use: No  . Drug use: No  . Sexual activity: Not Currently  Other Topics Concern  . 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 Difficulty of Paying Living Expenses: Not on file  Food Insecurity: No Food Insecurity  . Worried About Charity fundraiser in the Last Year: Never true  . Ran Out of Food in the Last Year: Never true  Transportation Needs: No Transportation Needs  . Lack of Transportation (Medical): No  . Lack of Transportation (Non-Medical): No  Physical Activity:   . Days of Exercise per Week: Not on file  . Minutes of Exercise per Session: Not on file  Stress:   . Feeling of Stress : Not on file  Social Connections:   . Frequency of Communication with Friends and Family: Not on file  . Frequency of Social Gatherings with Friends and Family: Not on file  . Attends Religious Services: Not on file  . Active Member of Clubs or Organizations: Not on file  . Attends Archivist Meetings: Not on file  . Marital Status: Not on file  Intimate Partner Violence:   . Fear of Current or Ex-Partner: Not on file  . Emotionally Abused: Not on file  . Physically Abused: Not on file  . Sexually Abused: Not on file     Review of Systems  Constitutional: Negative for appetite change, fatigue and fever.  HENT: Negative for dental problem (Working with her dentist, Dr. Darel Hong on 2 loose teeth.), drooling (Going to dentist now for loose teeth.), rhinorrhea, sinus pain and sore throat.   Eyes: Negative for visual  disturbance (Glasses stable).  Respiratory: Negative for cough and shortness of breath.   Cardiovascular: Positive for leg swelling (Foot swelling she thinks due to knee issues.). Negative for chest pain and palpitations.  Gastrointestinal: Negative for abdominal pain, blood in stool (No melena.), constipation and diarrhea.  Genitourinary: Negative for dysuria, frequency, pelvic pain and vaginal discharge.  Musculoskeletal: Positive for arthralgias (Right knee needs replacement.  Left knee replaced in past.).  Skin: Negative for rash.  Neurological: Positive for numbness (left thumb at times.).  Psychiatric/Behavioral: Positive for dysphoric mood (Also manic symptoms at times.). The patient is nervous/anxious (Does have anxiety.).       Objective:   BP 132/88 (BP Location: Left Arm, Patient Position: Sitting, Cuff Size: Large)   Pulse 80   Resp 12   Ht 5\' 6"  (1.676 m)   Wt 255 lb (115.7 kg)   BMI 41.16 kg/m   Physical Exam  Constitutional: She is oriented to person, place, and time. She appears well-developed and well-nourished.  HENT:  Head: Normocephalic and atraumatic.  Right Ear: Tympanic membrane, external ear and ear canal normal.  Left Ear: Tympanic membrane, external ear and ear canal normal.  Nose: Nose normal.  Mouth/Throat: Uvula is midline, oropharynx is clear and moist and mucous membranes are normal.  Eyes: Pupils are equal, round, and reactive to light. Conjunctivae and EOM are normal.  Neck: No thyromegaly present.  Cardiovascular: Normal rate, regular rhythm, S1 normal and S2 normal. Exam reveals no S3, no S4 and no friction rub.  No murmur heard. No carotid bruits.  Carotid, radial, femoral, DP and PT pulses normal and equal.   Pulmonary/Chest: Effort normal and breath sounds normal. Right breast exhibits no inverted nipple, no mass and no nipple discharge. Left breast exhibits no inverted nipple, no mass and no nipple discharge.  Abdominal: Soft. Bowel sounds  are normal. She exhibits no mass. There is no hepatosplenomegaly. There is no abdominal tenderness. No hernia.  Genitourinary:  Genitourinary Comments: Normal external female genitalia.  No uterine or adnexal mass or tenderness.   Musculoskeletal:        General: Normal range of motion.     Cervical back: Full passive range of motion without pain, normal range of motion and neck supple.     Comments: Heavy legs with varicosities bilaterally.  Lymphadenopathy:       Head (right side): No submental and no submandibular adenopathy present.       Head (left side): No submental and no submandibular adenopathy present.    She has no cervical adenopathy.    She has no axillary adenopathy.       Right: No inguinal and no supraclavicular adenopathy present.       Left: No inguinal and no supraclavicular adenopathy present.  Neurological: She is alert and oriented to person, place, and time. She has normal strength and normal reflexes. No cranial nerve deficit or sensory deficit. Coordination and gait normal.  Skin: Skin is warm. No rash noted.  Psychiatric: She has a normal mood and affect. Her speech is normal and behavior is normal. Judgment and thought content normal. Cognition and memory are normal.     Assessment & Plan  1.  CPE without pap Mammogram already performed this year. Colon cancer screening up to date. Encouraged 3 servings milk/almond milk daily with regular weight bearing exercise. CBC, CMP, FLP Pneumococcal 23 valent vaccine. Encouraged influenza vaccine--can obtain at her pharmacy as we are out.  2.  History ascending AA:  Echo follow up.

## 2019-01-16 LAB — CBC WITH DIFFERENTIAL/PLATELET
Basophils Absolute: 0 10*3/uL (ref 0.0–0.2)
Basos: 0 %
EOS (ABSOLUTE): 0.1 10*3/uL (ref 0.0–0.4)
Eos: 2 %
Hematocrit: 36.6 % (ref 34.0–46.6)
Hemoglobin: 11.6 g/dL (ref 11.1–15.9)
Immature Grans (Abs): 0 10*3/uL (ref 0.0–0.1)
Immature Granulocytes: 0 %
Lymphocytes Absolute: 2.3 10*3/uL (ref 0.7–3.1)
Lymphs: 38 %
MCH: 26.4 pg — ABNORMAL LOW (ref 26.6–33.0)
MCHC: 31.7 g/dL (ref 31.5–35.7)
MCV: 83 fL (ref 79–97)
Monocytes Absolute: 0.4 10*3/uL (ref 0.1–0.9)
Monocytes: 7 %
Neutrophils Absolute: 3.2 10*3/uL (ref 1.4–7.0)
Neutrophils: 53 %
Platelets: 285 10*3/uL (ref 150–450)
RBC: 4.4 x10E6/uL (ref 3.77–5.28)
RDW: 13.2 % (ref 11.7–15.4)
WBC: 6.1 10*3/uL (ref 3.4–10.8)

## 2019-01-16 LAB — COMPREHENSIVE METABOLIC PANEL
ALT: 10 IU/L (ref 0–32)
AST: 19 IU/L (ref 0–40)
Albumin/Globulin Ratio: 1.3 (ref 1.2–2.2)
Albumin: 4.1 g/dL (ref 3.8–4.9)
Alkaline Phosphatase: 93 IU/L (ref 39–117)
BUN/Creatinine Ratio: 15 (ref 9–23)
BUN: 11 mg/dL (ref 6–24)
Bilirubin Total: 0.5 mg/dL (ref 0.0–1.2)
CO2: 24 mmol/L (ref 20–29)
Calcium: 9.5 mg/dL (ref 8.7–10.2)
Chloride: 106 mmol/L (ref 96–106)
Creatinine, Ser: 0.73 mg/dL (ref 0.57–1.00)
GFR calc Af Amer: 106 mL/min/{1.73_m2} (ref >59)
GFR calc non Af Amer: 92 mL/min/{1.73_m2} (ref >59)
Globulin, Total: 3.1 g/dL (ref 1.5–4.5)
Glucose: 82 mg/dL (ref 65–99)
Potassium: 3.9 mmol/L (ref 3.5–5.2)
Sodium: 143 mmol/L (ref 134–144)
Total Protein: 7.2 g/dL (ref 6.0–8.5)

## 2019-01-16 LAB — LIPID PANEL W/O CHOL/HDL RATIO
Cholesterol, Total: 136 mg/dL (ref 100–199)
HDL: 54 mg/dL (ref >39)
LDL Chol Calc (NIH): 69 mg/dL (ref 0–99)
Triglycerides: 60 mg/dL (ref 0–149)
VLDL Cholesterol Cal: 13 mg/dL (ref 5–40)

## 2019-01-20 ENCOUNTER — Other Ambulatory Visit: Payer: Self-pay

## 2019-01-20 ENCOUNTER — Ambulatory Visit (HOSPITAL_COMMUNITY): Payer: Medicare HMO | Attending: Cardiovascular Disease

## 2019-01-20 DIAGNOSIS — I712 Thoracic aortic aneurysm, without rupture: Secondary | ICD-10-CM

## 2019-01-20 DIAGNOSIS — I7121 Aneurysm of the ascending aorta, without rupture: Secondary | ICD-10-CM

## 2019-03-16 IMAGING — CR DG CHEST 2V
2 series · 2 of 2 positions shown · non-contrast
Comparison: None.

CLINICAL DATA: Preop left knee surgery

EXAM:
CHEST  2 VIEW

[w chest pa]
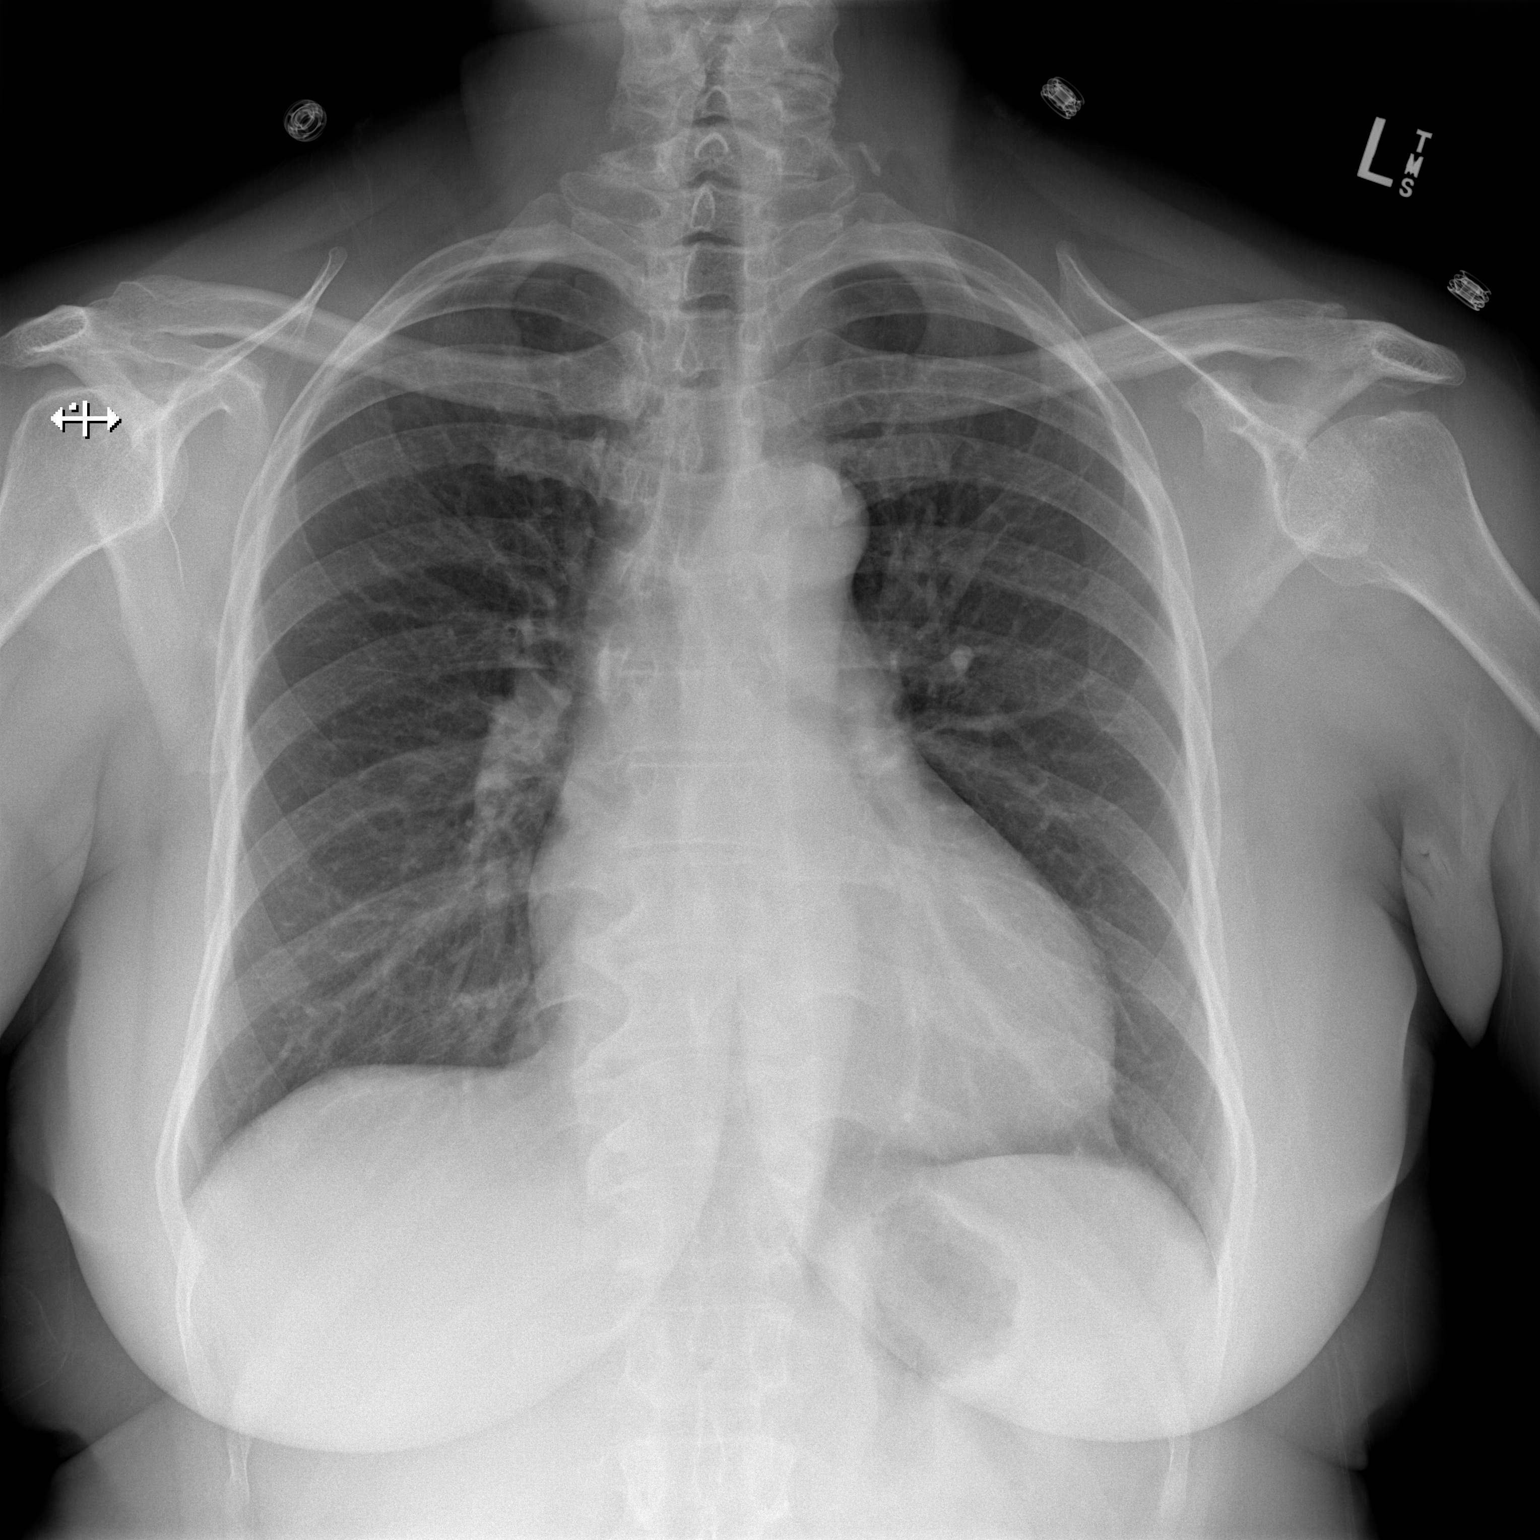

[w chest lat]
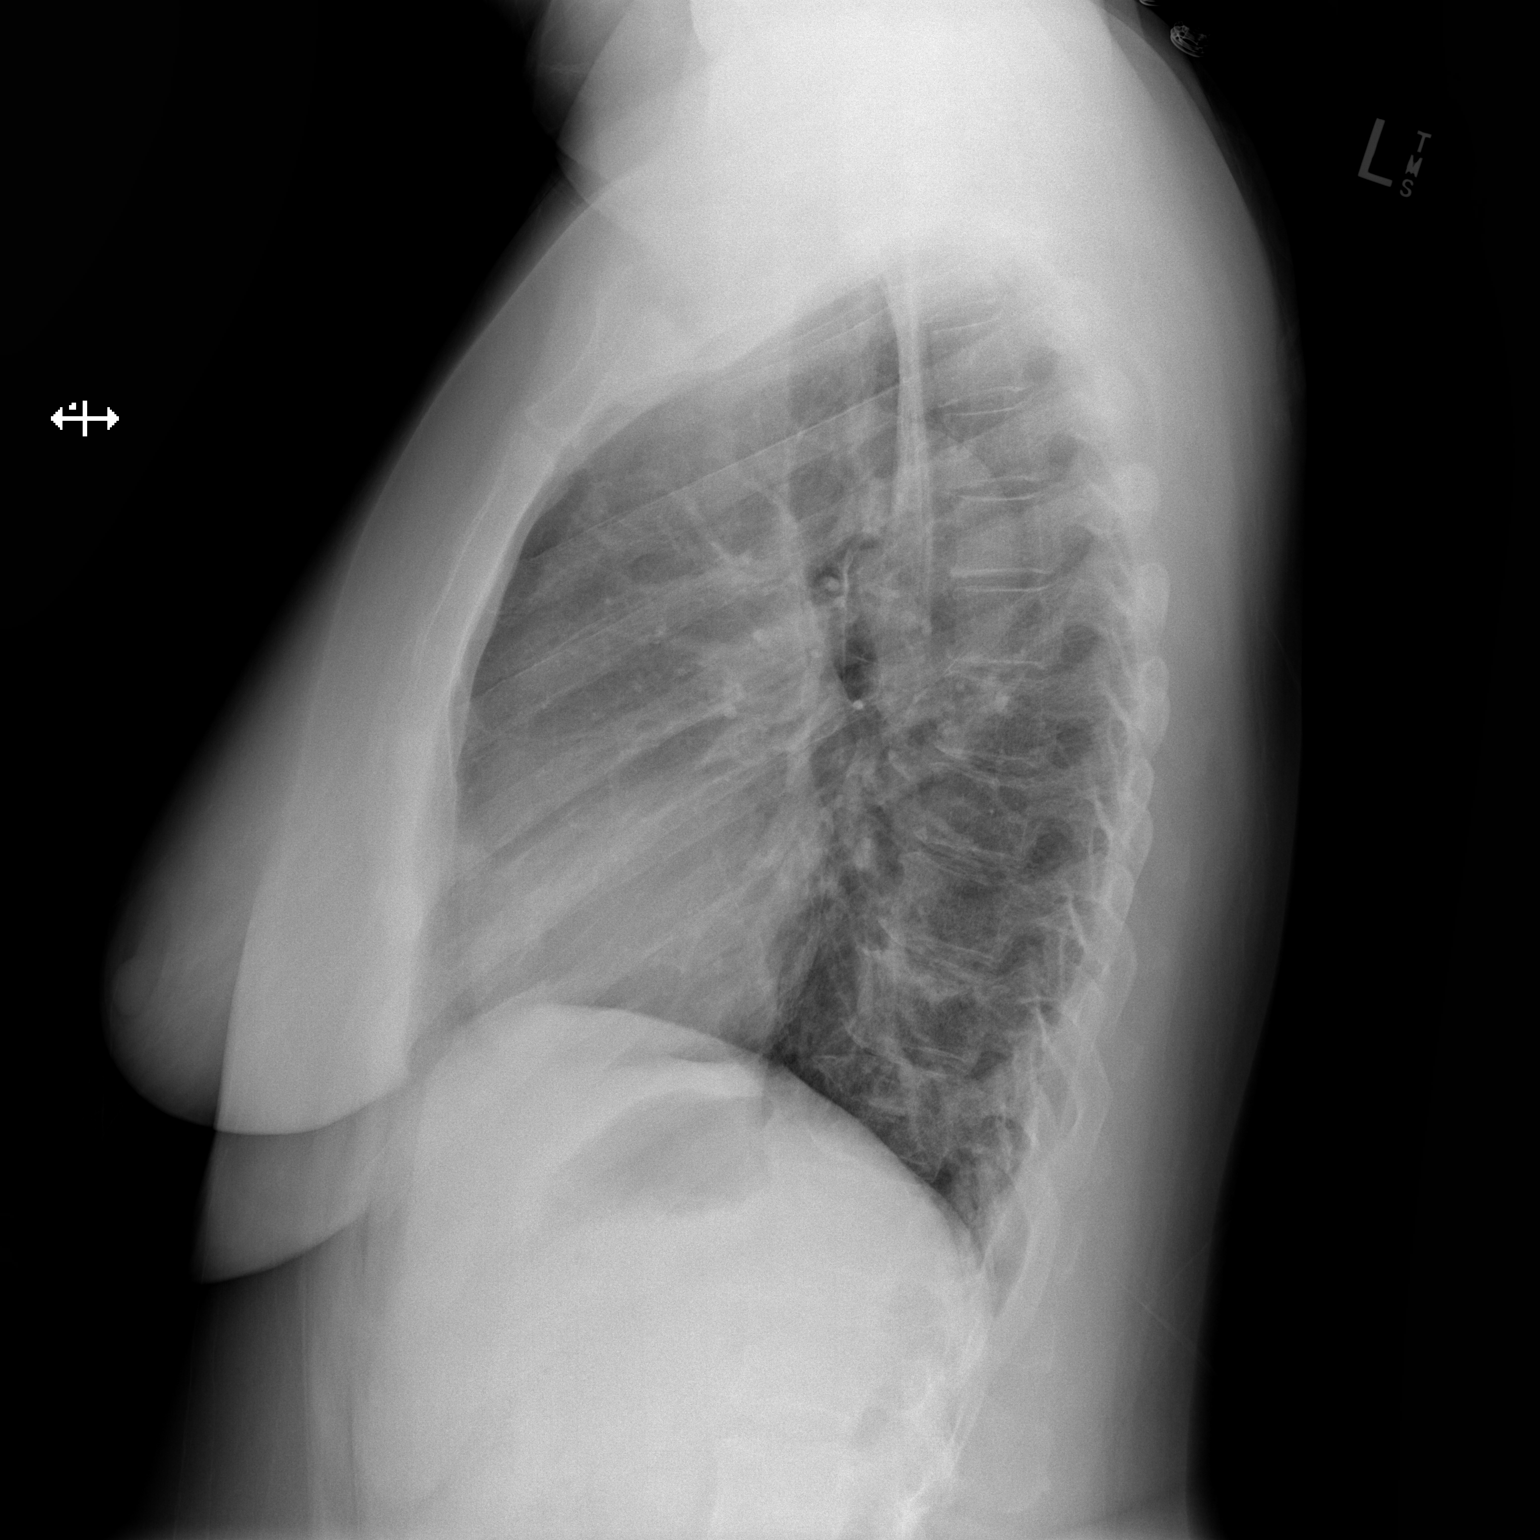

[2 of 2 positions shown; findings below may reference images not displayed]

FINDINGS: The heart size and mediastinal contours are within normal limits.
Both lungs are clear. The visualized skeletal structures are
unremarkable.
IMPRESSION: No active cardiopulmonary disease.

## 2019-05-20 ENCOUNTER — Ambulatory Visit: Payer: Medicare HMO | Admitting: Cardiovascular Disease

## 2019-05-20 ENCOUNTER — Other Ambulatory Visit: Payer: Self-pay

## 2019-05-20 ENCOUNTER — Ambulatory Visit: Payer: Medicare HMO | Attending: Internal Medicine

## 2019-05-20 DIAGNOSIS — Z23 Encounter for immunization: Secondary | ICD-10-CM

## 2019-05-20 NOTE — Progress Notes (Signed)
   Covid-19 Vaccination Clinic  Name:  Deborah Williamson    MRN: [id number] DOB: 10/01/1960  05/20/2019  Deborah Williamson was observed post Covid-19 immunization for 15 minutes without incidence. She was provided with Vaccine Information Sheet and instruction to access the V-Safe system.   Deborah Williamson was instructed to call 911 with any severe reactions post vaccine: Marland Kitchen Difficulty breathing  . Swelling of your face and throat  . A fast heartbeat  . A bad rash all over your body  . Dizziness and weakness    Immunizations Administered    Name Date Dose VIS Date Route   Moderna COVID-19 Vaccine 05/20/2019  3:10 PM 0.5 mL 02/23/2019 Intramuscular   Manufacturer: Moderna   Lot: [id number]   NDC: 84730-856-94

## 2019-06-16 ENCOUNTER — Other Ambulatory Visit: Payer: Self-pay

## 2019-06-16 MED ORDER — LOSARTAN POTASSIUM 100 MG PO TABS: 100.0000 mg | ORAL_TABLET | Freq: Every day | ORAL | 0 refills | Status: DC

## 2019-06-16 MED ORDER — CARVEDILOL 12.5 MG PO TABS: 12.5000 mg | ORAL_TABLET | Freq: Two times a day (BID) | ORAL | 0 refills | Status: DC

## 2019-06-16 MED ORDER — ROSUVASTATIN CALCIUM 10 MG PO TABS: 10.0000 mg | ORAL_TABLET | Freq: Every day | ORAL | 0 refills | Status: DC

## 2019-06-21 ENCOUNTER — Other Ambulatory Visit: Payer: Self-pay

## 2019-06-22 ENCOUNTER — Ambulatory Visit: Payer: Medicare HMO | Attending: Family

## 2019-06-22 DIAGNOSIS — Z23 Encounter for immunization: Secondary | ICD-10-CM

## 2019-06-22 NOTE — Progress Notes (Signed)
   Covid-19 Vaccination Clinic  Name:  Deborah Williamson    MRN: [id number] DOB: 07-Mar-1961  06/22/2019  [name] was observed post Covid-19 immunization for 15 minutes without incident. She was provided with Vaccine Information Sheet and instruction to access the V-Safe system.   [name] was instructed to call 911 with any severe reactions post vaccine: Marland Kitchen Difficulty breathing  . Swelling of face and throat  . A fast heartbeat  . A bad rash all over body  . Dizziness and weakness   Immunizations Administered    Name Date Dose VIS Date Route   Moderna COVID-19 Vaccine 06/22/2019  9:26 AM 0.5 mL 02/23/2019 Intramuscular   Manufacturer: Moderna   Lot: [id number]   NDC: 02725-366-44

## 2019-07-12 ENCOUNTER — Other Ambulatory Visit: Payer: Self-pay

## 2019-07-12 ENCOUNTER — Encounter: Payer: Self-pay | Admitting: Cardiovascular Disease

## 2019-07-12 ENCOUNTER — Ambulatory Visit: Payer: Medicare HMO | Admitting: Cardiovascular Disease

## 2019-07-12 VITALS — BP 168/108 | HR 65 | Ht 66.0 in | Wt 267.4 lb

## 2019-07-12 DIAGNOSIS — I5181 Takotsubo syndrome: Secondary | ICD-10-CM

## 2019-07-12 DIAGNOSIS — I10 Essential (primary) hypertension: Secondary | ICD-10-CM

## 2019-07-12 DIAGNOSIS — I712 Thoracic aortic aneurysm, without rupture, unspecified: Secondary | ICD-10-CM

## 2019-07-12 NOTE — Patient Instructions (Signed)
Medication Instructions:  No changes *If you need a refill on your cardiac medications before your next appointment, please call your pharmacy*   Lab Work: none If you have labs (blood work) drawn today and your tests are completely normal, you will receive your results only by: Marland Kitchen MyChart Message (if you have MyChart) OR . A paper copy in the mail If you have any lab test that is abnormal or we need to change your treatment, we will call you to review the results.   Testing/Procedures: Chest CTA due in June   Follow-Up: At [facility], you and your health needs are our priority.  As part of our continuing mission to provide you with exceptional heart care, we have created designated Provider Care Teams.  These Care Teams include your primary Cardiologist (physician) and Advanced Practice Providers (APPs -  Physician Assistants and Nurse Practitioners) who all work together to provide you with the care you need, when you need it.  Your next appointment:   12 month(s)  The format for your next appointment:   In Person  Provider:   You may see Verne Carrow, MD or one of the following Advanced Practice Providers on your designated Care Team:    Ronie Spies, PA-C  Jacolyn Reedy, PA-C    Other Instructions

## 2019-07-12 NOTE — Progress Notes (Signed)
Chief Complaint  Patient presents with  . Follow-up    NICM   History of Present Illness: 59 yo female with a history of thoracic aortic aneurysm, HTN and non-ischemic cardiomyopathy who is here today for cardiac follow up.  She was admitted to [facility] November 2014 with chest pain and elevated cardiac enzymes. Cardiac cath 02/16/2013 with no angiographic evidence of CAD, LVEF 35% with anteroapical, apical and inferoapical HK. Echocardiogram in 2014 with LVEF= 30-35%. grade 1 diastolic dysfunction, trivial MR. Chest/abdominal/pelvic CTA in 2014 showed no aortic dissection, mild aneurysmal dilatation of the ascending thoracic aorta, no pulmonary embolism. She was felt to have a stress induced cardiomyopathy. She was placed on a beta blocker and ACEI. Echo February 2015 with normal LV systolic function. Last echo October 2020 with LVEF=55-60%, mild MR. Chest CTA June 2019 with stable 4.0 cm aneurysm of the ascending aorta.   She is here today for follow up. The patient denies any chest pain, dyspnea, palpitations, lower extremity edema, orthopnea, PND, dizziness, near syncope or syncope.   Primary Care Physician: Julieanne Manson, MD   Past Medical History:  Diagnosis Date  . Anxiety   . Ascending aortic aneurysm (HCC) 01/2013   3.5-4 cm noted on CT-A  . Bipolar II disorder, most recent episode major depressive (HCC)    Followed at [facility].  Cline Crock, counselor:  [phone]  . DJD (degenerative joint disease) of knee 01/02/2017  . Dysrhythmia    "irregular" (02/17/2013)  . Headache(784.0)    "probably once/week" (02/17/2013)  . Hyperlipidemia   . Hypertension   . Insomnia   . Left ovarian cyst 01/2013   Incidental CT finding  . Migraine    "probably once/week" (02/17/2013)  . Morbid obesity (HCC) 01/02/2017  . Normocytic anemia   . NSTEMI (non-ST elevated myocardial infarction) (HCC) 02/16/2013   Takotsubo cardiomyopathy, normal cors  . Obesity   . OCD (obsessive  compulsive disorder)   . Plantar fasciitis of right foot   . PTSD (post-traumatic stress disorder)   . PTSD (post-traumatic stress disorder)   . PTSD (post-traumatic stress disorder)   . Takotsubo cardiomyopathy 01/2013   a. 2D echo 02/17/13: EF 30-35%, periapical AK, normal RV size/function, moderate pulmonary HTN. c/w Takotsubo CM. ;  b.  Echo (04/2013): EF 55%, no WMA, Gr 1 DD, mildly dilated ascending aorta (39 mm), mild MR, mild BAE, PASP 33  . Trichomonas vaginitis 10/2014    Past Surgical History:  Procedure Laterality Date  . BRACHIOPLASTY Bilateral 05/2011   Plastic Surgery to remove loose skin after Weight Loss  . CARDIAC CATHETERIZATION  02/17/2013   no angiographic evidence of CAD, EF 35%, HK or the anteroapical wall, apex and inferoapical wall  . FOOT SURGERY Right   . KNEE ARTHROSCOPY Right 02/05/2017   [facility]  . LEFT HEART CATHETERIZATION WITH CORONARY ANGIOGRAM N/A 02/17/2013   Procedure: LEFT HEART CATHETERIZATION WITH CORONARY ANGIOGRAM;  Surgeon: Kathleene Hazel, MD;  Location: [facility] CATH LAB;  Service: Cardiovascular;  Laterality: N/A;  . MASS EXCISION Right 02/29/2016   Procedure: EXCISION MASS, right wrist;  Surgeon: Betha Loa, MD;  Location: [facility];  Service: Orthopedics;  Laterality: Right;  EXCISION MASS, right wrist  . TOTAL KNEE ARTHROPLASTY Left 03/10/2017   Procedure: TOTAL KNEE ARTHROPLASTY;  Surgeon: Jodi Geralds, MD;  Location: [facility] OR;  Service: Orthopedics;  Laterality: Left;  . TUBAL LIGATION  1989    Current Outpatient Medications  Medication Sig Dispense Refill  .  acetaminophen (TYLENOL) 500 MG tablet Take 1,000 mg by mouth 2 (two) times daily as needed for moderate pain or headache.    . Ascorbic Acid (VITAMIN C ADULT GUMMIES PO) Take by mouth. 2 daily    . aspirin EC 81 MG tablet Take 81 mg by mouth daily.    . busPIRone (BUSPAR) 15 MG tablet 15 mg 3 (three) times daily.     . carvedilol (COREG) 12.5 MG tablet  Take 1 tablet (12.5 mg total) by mouth 2 (two) times daily. Please keep upcoming appt in April with Dr. Clifton James for future refills. Thank you 180 tablet 0  . FIBER ADULT GUMMIES PO Take by mouth. 2 daily    . lithium carbonate 300 MG capsule 300 mg. 1 daily at bedtime    . losartan (COZAAR) 100 MG tablet Take 1 tablet (100 mg total) by mouth daily. Please keep upcoming appt in April with Dr. Clifton James for future refills. Thank you 90 tablet 0  . Melatonin 5 MG TABS Take 15 mg by mouth at bedtime as needed (sleep).    . meloxicam (MOBIC) 15 MG tablet Take 15 mg by mouth daily as needed for pain.    . Multiple Vitamin (MULTIVITAMIN WITH MINERALS) TABS tablet Take 1 tablet by mouth daily.    . Multiple Vitamins-Minerals (HAIR SKIN AND NAILS FORMULA) TABS Take 1 tablet by mouth daily.    . prazosin (MINIPRESS) 2 MG capsule at bedtime.     Marland Kitchen REXULTI 2 MG TABS daily.     . rosuvastatin (CRESTOR) 10 MG tablet Take 1 tablet (10 mg total) by mouth daily. 90 tablet 0  . sertraline (ZOLOFT) 100 MG tablet Take 100 mg by mouth. 2 tablets daily in the morning    . tiZANidine (ZANAFLEX) 4 MG tablet Take 4 mg by mouth every 8 (eight) hours as needed for muscle spasms.    . traZODone (DESYREL) 100 MG tablet Take 100 mg by mouth. 3 tablets at bedtime     No current facility-administered medications for this visit.    Allergies  Allergen Reactions  . Lisinopril Other (See Comments)    Dry cough  . Vicodin [Hydrocodone-Acetaminophen] Nausea Only    Social History   Socioeconomic History  . Marital status: Legally Separated    Spouse name: Not on file  . Number of children: 2  . Years of education: Not on file  . Highest education level: Bachelor's degree (e.g., BA, AB, BS)  Occupational History  . Occupation: unemployed/disability  Tobacco Use  . Smoking status: Never Smoker  . Smokeless tobacco: Never Used  Substance and Sexual Activity  . Alcohol use: No  . Drug use: No  . Sexual activity: Not  Currently  Other Topics Concern  . 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 Difficulty of Paying Living Expenses:   Food Insecurity: No Food Insecurity  . Worried About Programme researcher, broadcasting/film/video in the Last Year: Never true  . Ran Out of Food in the Last Year: Never true  Transportation Needs: No Transportation Needs  . Lack of Transportation (Medical): No  . Lack of Transportation (Non-Medical): No  Physical Activity:   . Days of Exercise per Week:   . Minutes of Exercise per Session:   Stress:   . Feeling of  Stress :   Social Connections:   . Frequency of Communication with Friends and Family:   . Frequency of Social Gatherings with Friends and Family:   . Attends Religious Services:   . Active Member of Clubs or Organizations:   . Attends Archivist Meetings:   Marland Kitchen Marital Status:   Intimate Partner Violence:   . Fear of Current or Ex-Partner:   . Emotionally Abused:   Marland Kitchen Physically Abused:   . Sexually Abused:     Family History  Problem Relation Age of Onset  . Hypertension Mother   . Breast cancer Mother 71  . Depression Sister   . Diabetes Paternal Grandmother   . Stroke Paternal Grandmother   . Prostate cancer Maternal Uncle   . Prostate cancer Maternal Uncle   . Breast cancer Cousin 30  . Breast cancer Maternal Aunt 70  . Breast cancer Maternal Aunt 30  . Breast cancer Half-Sister 92       Breast cancer    Review of Systems:  As stated in the HPI and otherwise negative.   BP (!) 168/108   Pulse 65   Ht 5\' 6"  (1.676 m)   Wt 267 lb 6.4 oz (121.3 kg)   SpO2 96%   BMI 43.16 kg/m   Physical Examination: General: Well developed, well nourished, NAD  HEENT: OP clear, mucus membranes moist  SKIN: warm, dry. No rashes. Neuro: No focal deficits  Musculoskeletal: Muscle strength 5/5 all ext   Psychiatric: Mood and affect normal  Neck: No JVD, no carotid bruits, no thyromegaly, no lymphadenopathy.  Lungs:Clear bilaterally, no wheezes, rhonci, crackles Cardiovascular: Regular rate and rhythm. No murmurs, gallops or rubs. Abdomen:Soft. Bowel sounds present. Non-tender.  Extremities: No lower extremity edema. Pulses are 2 + in the bilateral DP/PT.  Echo October 2020: 1. Left ventricular ejection fraction, by visual estimation, is 55 to  60%. The left ventricle has normal function. There is no left ventricular  hypertrophy.  2. The left ventricle has no regional wall motion abnormalities.  3. LVEF by 3D 55%.  4. Global right ventricle has normal systolic function.The right  ventricular size is normal. No increase in right ventricular wall  thickness.  5. Left atrial size was normal.  6. Right atrial size was normal.  7. Presence of pericardial fat pad.  8. The pericardial effusion is circumferential.  9. Trivial pericardial effusion is present.  10. The mitral valve is grossly normal. Mild mitral valve regurgitation.  11. The tricuspid valve is grossly normal. Tricuspid valve regurgitation  is mild.  12. The aortic valve is tricuspid. Aortic valve regurgitation is not  visualized. No evidence of aortic valve sclerosis or stenosis.  13. The pulmonic valve was grossly normal. Pulmonic valve regurgitation is  trivial.  14. There is mild dilatation of the ascending aorta measuring 39 mm.  15. Normal pulmonary artery systolic pressure.  16. The tricuspid regurgitant velocity is 2.36 m/s, and with an assumed  right atrial pressure of 3 mmHg, the estimated right ventricular systolic  pressure is normal at 25.3 mmHg.  17. The inferior vena cava is normal in size with greater than 50%  respiratory variability, suggesting right atrial pressure of 3 mmHg.  18. The average left ventricular global longitudinal strain is -21.1 %.   EKG:  EKG is ordered today. This demonstrates  Sinus  Recent Labs: 01/15/2019: ALT 10; BUN 11; Creatinine, Ser 0.73; Hemoglobin 11.6; Platelets 285; Potassium 3.9; Sodium 143   Lipid Panel  Component Value Date/Time   CHOL 136 01/15/2019 1514   TRIG 60 01/15/2019 1514   HDL 54 01/15/2019 1514   CHOLHDL 3 05/07/2013 1057   VLDL 6.0 05/07/2013 1057   LDLCALC 69 01/15/2019 1514     Wt Readings from Last 3 Encounters:  07/12/19 267 lb 6.4 oz (121.3 kg)  01/15/19 255 lb (115.7 kg)  05/20/18 252 lb 6.4 oz (114.5 kg)     Other studies Reviewed: Additional studies/ records that were reviewed today include: none  Assessment and Plan:   1. Non-ischemic cardiomyopathy: Likely Takotsubo cardiomyopathy given presentation in November 2014. Her LV function has returned to normal. Echo October 2020 with LVEF=55-60%. Will continue Coreg and Cozaar.   2. HTN: BP is elevated today. She has not been checking at home. Will check at home this week and take in with her to her primary care visit later this week. . Continue current therapy. Can consider increasing Coreg or adding Norvasc if BP still elevated.   3. Thoracic aortic aneurysm: Stable mild dilatation of the ascending aorta at 4.0 cm June 2019. Unchanged from scan in 2014. Will repeat CTA every 2 years given stability over time. Will arrange chest CTA for June 2021.   Current medicines are reviewed at length with the patient today.  The patient does not have concerns regarding medicines.  The following changes have been made:  no change  Labs/ tests ordered today include: None  Orders Placed This Encounter  Procedures  . CT ANGIO CHEST AORTA W/CM & OR WO/CM  . EKG 12-Lead    Disposition:   FU with me in 12 months  Signed, Verne Carrow, MD 07/12/2019 10:07 AM    [facility] [address] [place], [place], [place]  [address] Phone: [phone]; Fax: [phone]

## 2019-07-16 ENCOUNTER — Encounter: Payer: Self-pay | Admitting: Internal Medicine

## 2019-07-16 ENCOUNTER — Other Ambulatory Visit: Payer: Self-pay

## 2019-07-16 ENCOUNTER — Ambulatory Visit (INDEPENDENT_AMBULATORY_CARE_PROVIDER_SITE_OTHER): Payer: Medicare HMO | Admitting: Internal Medicine

## 2019-07-16 VITALS — BP 138/88 | HR 80 | Resp 12 | Ht 66.0 in | Wt 264.0 lb

## 2019-07-16 DIAGNOSIS — L299 Pruritus, unspecified: Secondary | ICD-10-CM

## 2019-07-16 DIAGNOSIS — R61 Generalized hyperhidrosis: Secondary | ICD-10-CM | POA: Diagnosis not present

## 2019-07-16 DIAGNOSIS — M79641 Pain in right hand: Secondary | ICD-10-CM

## 2019-07-16 MED ORDER — LORATADINE 10 MG PO TABS: 10.0000 mg | ORAL_TABLET | Freq: Every day | ORAL | Status: DC

## 2019-07-16 NOTE — Patient Instructions (Signed)
Eucerin cream for eczema relief

## 2019-07-16 NOTE — Progress Notes (Signed)
Subjective:    Patient ID: Deborah Williamson, female   DOB: 1960-10-19, 59 y.o.   MRN: [id number]   HPI   1.  Right hand pain:  Points to base of thenar eminence and also on dorsal aspect of hand in same side.  Notes especially with gripping things.  Had ganglion cyst removed and extensor tenosynovectomy in 2018 with improvement of same symptoms and now the pain is recurring.  Was cared for by Dr. Fredna Dow, hand surgery with [facility] in [place].  2.  Night sweats:  Wakes soaking wet.  Almost every night. Does not really have symptoms during the day.   3.  Itching all over:  Back, arms, thighs.  Has been taking Benadryl, but makes her sleepy.   Dove soap.  Nivea  Current Meds  Medication Sig  . acetaminophen (TYLENOL) 500 MG tablet Take 1,000 mg by mouth 2 (two) times daily as needed for moderate pain or headache.  . Ascorbic Acid (VITAMIN C ADULT GUMMIES PO) Take by mouth. 2 daily  . aspirin EC 81 MG tablet Take 81 mg by mouth daily.  . busPIRone (BUSPAR) 15 MG tablet 15 mg 3 (three) times daily.   . carvedilol (COREG) 12.5 MG tablet Take 1 tablet (12.5 mg total) by mouth 2 (two) times daily. Please keep upcoming appt in April with Dr. Angelena Form for future refills. Thank you  . FIBER ADULT GUMMIES PO Take by mouth. 2 daily  . lithium carbonate 300 MG capsule 300 mg. 1 daily at bedtime  . losartan (COZAAR) 100 MG tablet Take 1 tablet (100 mg total) by mouth daily. Please keep upcoming appt in April with Dr. Angelena Form for future refills. Thank you  . Melatonin 5 MG TABS Take 15 mg by mouth at bedtime as needed (sleep).  . meloxicam (MOBIC) 15 MG tablet Take 15 mg by mouth daily as needed for pain.  . Multiple Vitamin (MULTIVITAMIN WITH MINERALS) TABS tablet Take 1 tablet by mouth daily.  . Multiple Vitamins-Minerals (HAIR SKIN AND NAILS FORMULA) TABS Take 1 tablet by mouth daily.  . prazosin (MINIPRESS) 2 MG capsule at bedtime.   Marland Kitchen REXULTI 2 MG TABS daily.   . rosuvastatin (CRESTOR) 10  MG tablet Take 1 tablet (10 mg total) by mouth daily.  . sertraline (ZOLOFT) 100 MG tablet Take 100 mg by mouth. 2 tablets daily in the morning  . tiZANidine (ZANAFLEX) 4 MG tablet Take 4 mg by mouth every 8 (eight) hours as needed for muscle spasms.  . traZODone (DESYREL) 100 MG tablet Take 100 mg by mouth. 3 tablets at bedtime   Allergies  Allergen Reactions  . Lisinopril Other (See Comments)    Dry cough  . Vicodin [Hydrocodone-Acetaminophen] Nausea Only     Review of Systems    Objective:   BP 138/88 (BP Location: Left Arm, Patient Position: Sitting, Cuff Size: Large)   Pulse 80   Resp 12   Ht 5\' 6"  (1.676 m)   Wt 264 lb (119.7 kg)   BMI 42.61 kg/m   Physical Exam  NAD Lungs:  CTA CV:  RRR without murmur or rub. Skin:  No rash or lesion.  Not dry. Tender over right thenar eminence, + Finkelstein's.  No obvious swelling or redness.   Assessment & Plan   1.  Right thumb tenosynovitis: suspect several tendons and musculature involved.  Referral back to Dr. Fredna Dow  2.  Night Sweats:  To keep room cool, light clothing at bedtime and light  sheets.  May try Black Cohosh from [employer].  3.  Generalized pruritis:  No bleach in bath.  Decrease frequency of bathing.  Eucerin Cream for Eczema Relief right after bathing.  Claritin 10 mg daily as needed.  CBC, CMP

## 2019-07-17 LAB — CBC WITH DIFFERENTIAL/PLATELET
Basophils Absolute: 0 10*3/uL (ref 0.0–0.2)
Basos: 1 %
EOS (ABSOLUTE): 0.2 10*3/uL (ref 0.0–0.4)
Eos: 4 %
Hematocrit: 37.3 % (ref 34.0–46.6)
Hemoglobin: 11.7 g/dL (ref 11.1–15.9)
Immature Grans (Abs): 0 10*3/uL (ref 0.0–0.1)
Immature Granulocytes: 0 %
Lymphocytes Absolute: 2.3 10*3/uL (ref 0.7–3.1)
Lymphs: 46 %
MCH: 25.9 pg — ABNORMAL LOW (ref 26.6–33.0)
MCHC: 31.4 g/dL — ABNORMAL LOW (ref 31.5–35.7)
MCV: 83 fL (ref 79–97)
Monocytes Absolute: 0.3 10*3/uL (ref 0.1–0.9)
Monocytes: 7 %
Neutrophils Absolute: 2.1 10*3/uL (ref 1.4–7.0)
Neutrophils: 42 %
Platelets: 273 10*3/uL (ref 150–450)
RBC: 4.52 x10E6/uL (ref 3.77–5.28)
RDW: 13.3 % (ref 11.7–15.4)
WBC: 4.9 10*3/uL (ref 3.4–10.8)

## 2019-07-17 LAB — COMPREHENSIVE METABOLIC PANEL
ALT: 10 IU/L (ref 0–32)
AST: 17 IU/L (ref 0–40)
Albumin/Globulin Ratio: 1.3 (ref 1.2–2.2)
Albumin: 4 g/dL (ref 3.8–4.9)
Alkaline Phosphatase: 102 IU/L (ref 39–117)
BUN/Creatinine Ratio: 16 (ref 9–23)
BUN: 11 mg/dL (ref 6–24)
Bilirubin Total: 0.5 mg/dL (ref 0.0–1.2)
CO2: 26 mmol/L (ref 20–29)
Calcium: 9.9 mg/dL (ref 8.7–10.2)
Chloride: 104 mmol/L (ref 96–106)
Creatinine, Ser: 0.69 mg/dL (ref 0.57–1.00)
GFR calc Af Amer: 112 mL/min/{1.73_m2} (ref >59)
GFR calc non Af Amer: 97 mL/min/{1.73_m2} (ref >59)
Globulin, Total: 3 g/dL (ref 1.5–4.5)
Glucose: 90 mg/dL (ref 65–99)
Potassium: 4.8 mmol/L (ref 3.5–5.2)
Sodium: 141 mmol/L (ref 134–144)
Total Protein: 7 g/dL (ref 6.0–8.5)

## 2019-08-17 ENCOUNTER — Other Ambulatory Visit: Payer: Self-pay | Admitting: Internal Medicine

## 2019-08-17 DIAGNOSIS — Z1231 Encounter for screening mammogram for malignant neoplasm of breast: Secondary | ICD-10-CM

## 2019-08-20 ENCOUNTER — Other Ambulatory Visit: Payer: Self-pay

## 2019-08-20 MED ORDER — CARVEDILOL 12.5 MG PO TABS: 12.5000 mg | ORAL_TABLET | Freq: Two times a day (BID) | ORAL | 3 refills | Status: DC

## 2019-08-20 MED ORDER — LOSARTAN POTASSIUM 100 MG PO TABS: 100.0000 mg | ORAL_TABLET | Freq: Every day | ORAL | 3 refills | Status: DC

## 2019-08-20 MED ORDER — ROSUVASTATIN CALCIUM 10 MG PO TABS: 10.0000 mg | ORAL_TABLET | Freq: Every day | ORAL | 3 refills | Status: DC

## 2019-08-20 NOTE — Telephone Encounter (Signed)
Pt's medications were sent to pt's pharmacy as requested. Confirmation received.  

## 2019-08-24 ENCOUNTER — Other Ambulatory Visit: Payer: Self-pay | Admitting: Cardiovascular Disease

## 2019-08-27 ENCOUNTER — Other Ambulatory Visit: Payer: Self-pay

## 2019-08-27 ENCOUNTER — Ambulatory Visit
Admission: RE | Admit: 2019-08-27 | Discharge: 2019-08-27 | Disposition: A | Discharge status: Home / Self Care | Payer: Medicare HMO | Source: Ambulatory Visit | Attending: Cardiovascular Disease | Admitting: Cardiovascular Disease

## 2019-08-27 DIAGNOSIS — I712 Thoracic aortic aneurysm, without rupture, unspecified: Secondary | ICD-10-CM

## 2019-08-27 MED ORDER — IOHEXOL 350 MG/ML SOLN
75.0000 mL | Freq: Once | INTRAVENOUS | Status: AC | PRN
  Administered 2019-08-27: 75 mL via INTRAVENOUS

## 2019-08-30 ENCOUNTER — Telehealth: Payer: Self-pay | Admitting: Cardiovascular Disease

## 2019-08-30 DIAGNOSIS — Z01812 Encounter for preprocedural laboratory examination: Secondary | ICD-10-CM

## 2019-08-30 NOTE — Telephone Encounter (Signed)
Patient called stating she received a call telling her she needed to get blood work done prior to her CT scan on Wednesday, 6/9

## 2019-08-30 NOTE — Telephone Encounter (Signed)
Left detailed message on VM that she will need labs tomorrow and that I've scheduled her an appointment and that she can come anytime between 7:30 am and 4:30 pm tomorrow.  Asked that she call back if this does not work with her schedule.

## 2019-08-31 ENCOUNTER — Other Ambulatory Visit: Payer: Medicare HMO

## 2019-08-31 ENCOUNTER — Other Ambulatory Visit: Payer: Self-pay

## 2019-08-31 DIAGNOSIS — Z01812 Encounter for preprocedural laboratory examination: Secondary | ICD-10-CM

## 2019-08-31 LAB — BASIC METABOLIC PANEL
BUN/Creatinine Ratio: 21 (ref 9–23)
BUN: 16 mg/dL (ref 6–24)
CO2: 20 mmol/L (ref 20–29)
Calcium: 9.3 mg/dL (ref 8.7–10.2)
Chloride: 105 mmol/L (ref 96–106)
Creatinine, Ser: 0.77 mg/dL (ref 0.57–1.00)
GFR calc Af Amer: 99 mL/min/{1.73_m2} (ref >59)
GFR calc non Af Amer: 86 mL/min/{1.73_m2} (ref >59)
Glucose: 98 mg/dL (ref 65–99)
Potassium: 4.1 mmol/L (ref 3.5–5.2)
Sodium: 138 mmol/L (ref 134–144)

## 2019-09-01 ENCOUNTER — Ambulatory Visit (INDEPENDENT_AMBULATORY_CARE_PROVIDER_SITE_OTHER)
Admission: RE | Admit: 2019-09-01 | Discharge: 2019-09-01 | Disposition: A | Discharge status: Home / Self Care | Payer: Medicare HMO | Source: Ambulatory Visit | Attending: Cardiovascular Disease | Admitting: Cardiovascular Disease

## 2019-09-01 DIAGNOSIS — I712 Thoracic aortic aneurysm, without rupture: Secondary | ICD-10-CM

## 2019-09-01 MED ORDER — IOHEXOL 350 MG/ML SOLN
75.0000 mL | Freq: Once | INTRAVENOUS | Status: AC | PRN
  Administered 2019-09-01: 75 mL via INTRAVENOUS

## 2019-09-24 ENCOUNTER — Ambulatory Visit
Admission: RE | Admit: 2019-09-24 | Discharge: 2019-09-24 | Disposition: A | Discharge status: Home / Self Care | Payer: Medicare HMO | Source: Ambulatory Visit | Attending: Internal Medicine | Admitting: Internal Medicine

## 2019-09-24 ENCOUNTER — Other Ambulatory Visit: Payer: Self-pay

## 2019-09-24 DIAGNOSIS — Z1231 Encounter for screening mammogram for malignant neoplasm of breast: Secondary | ICD-10-CM

## 2019-09-29 ENCOUNTER — Other Ambulatory Visit: Payer: Self-pay | Admitting: Internal Medicine

## 2019-09-29 DIAGNOSIS — R928 Other abnormal and inconclusive findings on diagnostic imaging of breast: Secondary | ICD-10-CM

## 2019-10-01 ENCOUNTER — Telehealth: Payer: Self-pay | Admitting: Internal Medicine

## 2019-10-01 NOTE — Telephone Encounter (Signed)
Patient called requesting update on hand surgery referral issued back on April 2021. Patient stated will like to receive a call back to get any information on this.   Please advise

## 2019-10-01 NOTE — Telephone Encounter (Signed)
Left detailed message for patient. Appointment scheduled with Dr. Merlyn Lot for 10/15/19 @ 9:30 am. Asked for a return call to confirm receipt of message.

## 2019-10-11 ENCOUNTER — Other Ambulatory Visit: Payer: Self-pay

## 2019-10-11 ENCOUNTER — Ambulatory Visit
Admission: RE | Admit: 2019-10-11 | Discharge: 2019-10-11 | Disposition: A | Discharge status: Home / Self Care | Payer: Medicare HMO | Source: Ambulatory Visit | Attending: Internal Medicine | Admitting: Internal Medicine

## 2019-10-11 DIAGNOSIS — R928 Other abnormal and inconclusive findings on diagnostic imaging of breast: Secondary | ICD-10-CM

## 2020-01-14 ENCOUNTER — Ambulatory Visit (INDEPENDENT_AMBULATORY_CARE_PROVIDER_SITE_OTHER): Payer: Medicare HMO | Admitting: Internal Medicine

## 2020-01-14 ENCOUNTER — Encounter: Payer: Self-pay | Admitting: Internal Medicine

## 2020-01-14 VITALS — BP 171/102 | HR 64 | Resp 12 | Ht 65.5 in | Wt 258.0 lb

## 2020-01-14 DIAGNOSIS — Z23 Encounter for immunization: Secondary | ICD-10-CM | POA: Diagnosis not present

## 2020-01-14 DIAGNOSIS — Z0000 Encounter for general adult medical examination without abnormal findings: Secondary | ICD-10-CM | POA: Diagnosis not present

## 2020-01-14 DIAGNOSIS — Z809 Family history of malignant neoplasm, unspecified: Secondary | ICD-10-CM

## 2020-01-14 DIAGNOSIS — L299 Pruritus, unspecified: Secondary | ICD-10-CM

## 2020-01-14 NOTE — Patient Instructions (Addendum)
Can google "advance directives, [place]"  And bring up form from Secretary of [place]. Print and fill out Or can go to "5 wishes"  Which is also in Spanish and fill out--this costs $5--perhaps easier to use. Designate a Cytogeneticist to speak for you if you are unable to speak for yourself when ill or injured   Eucerin Eczema relief after showering daily--particularly to back.

## 2020-01-14 NOTE — Progress Notes (Signed)
Subjective:    Patient ID: Deborah Williamson, female   DOB: [birth year], 59 y.o.   MRN: [id number]   HPI   CPE without pap  1.  Pap:  Last 09/2017 and normal.    2.  Mammogram:  Performed 09/2019 requiring extra views for right breast which showed no concern.  Mother and maternal aunts x 2 with breast cancer (all postmenopausal)  Mother had genetic testing and negative.  2 of maternal uncles with prostate cancer.    3.  Osteoprevention:  Some milk and also drinks a protein shake daily.  Walks her dog regularly.  4.  Guaiac Cards:  Reportedly performed in past, but not in her lab record.  Have been both negative and positive in past.  5.  Colonoscopy:  Dr. Bosie Clos 12/2018 with plans to repeat in 1 year.  She is planning to call.  Unable to find the path results.  6.  Immunizations:   Immunization History  Administered Date(s) Administered  . Influenza Inj Mdck Quad Pf 01/02/2017  . Influenza-Unspecified 02/10/2017  . Moderna SARS-COVID-2 Vaccination 05/20/2019, 06/22/2019  . Pneumococcal Polysaccharide-23 01/15/2019  . Tdap 08/29/2008     7.  Glucose/Cholesterol:  Blood glucose and lipids well controlled.  Continues on Rosuvastatin. Lipid Panel     Component Value Date/Time   CHOL 136 01/15/2019 1514   TRIG 60 01/15/2019 1514   HDL 54 01/15/2019 1514   CHOLHDL 3 05/07/2013 1057   VLDL 6.0 05/07/2013 1057   LDLCALC 69 01/15/2019 1514   LABVLDL 13 01/15/2019 1514     Current Meds  Medication Sig  . acetaminophen (TYLENOL) 500 MG tablet Take 1,000 mg by mouth 2 (two) times daily as needed for moderate pain or headache.  . Ascorbic Acid (VITAMIN C ADULT GUMMIES PO) Take by mouth. 2 daily  . aspirin EC 81 MG tablet Take 81 mg by mouth daily.  . busPIRone (BUSPAR) 15 MG tablet 15 mg 3 (three) times daily.   . carvedilol (COREG) 12.5 MG tablet Take 1 tablet (12.5 mg total) by mouth 2 (two) times daily.  Marland Kitchen FIBER ADULT GUMMIES PO Take by mouth. 2 daily  . lithium  carbonate 300 MG capsule 300 mg. 1 daily at bedtime  . loratadine (CLARITIN) 10 MG tablet Take 1 tablet (10 mg total) by mouth daily.  Marland Kitchen losartan (COZAAR) 100 MG tablet Take 1 tablet (100 mg total) by mouth daily.  . Melatonin 5 MG TABS Take 15 mg by mouth at bedtime as needed (sleep).  . meloxicam (MOBIC) 15 MG tablet Take 15 mg by mouth daily as needed for pain.  . Multiple Vitamin (MULTIVITAMIN WITH MINERALS) TABS tablet Take 1 tablet by mouth daily.  . Multiple Vitamins-Minerals (HAIR SKIN AND NAILS FORMULA) TABS Take 1 tablet by mouth daily.  . prazosin (MINIPRESS) 2 MG capsule at bedtime.   Marland Kitchen REXULTI 2 MG TABS daily.   . rosuvastatin (CRESTOR) 10 MG tablet Take 1 tablet (10 mg total) by mouth daily.  . sertraline (ZOLOFT) 100 MG tablet Take 100 mg by mouth. 2 tablets daily in the morning  . tiZANidine (ZANAFLEX) 4 MG tablet Take 4 mg by mouth every 8 (eight) hours as needed for muscle spasms.  . traZODone (DESYREL) 100 MG tablet Take 100 mg by mouth. 3 tablets at bedtime   Allergies  Allergen Reactions  . Lisinopril Other (See Comments)    Dry cough  . Vicodin [Hydrocodone-Acetaminophen] Nausea Only   Past Medical History:  Diagnosis  Date  . Anxiety   . Ascending aortic aneurysm (HCC) 01/2013   3.5-4 cm noted on CT-A  . Bipolar II disorder, most recent episode major depressive (HCC)    Follow[facility] at [facility].  Cline Crock, counselor:  [phone]  . DJD (degenerative joint disease) of knee 01/02/2017  . Dysrhythmia    "irregular" (02/17/2013)  . Headache(784.0)    "probably once/week" (02/17/2013)  . Hyperlipidemia   . Hypertension   . Insomnia   . Left ovarian cyst 01/2013   Incidental CT finding  . Migraine    "probably once/week" (02/17/2013)  . Morbid obesity (HCC) 01/02/2017  . Normocytic anemia   . NSTEMI (non-ST elevated myocardial infarction) (HCC) 02/16/2013   Takotsubo cardiomyopathy, normal cors  . Obesity   . OCD (obsessive compulsive disorder)   .  Plantar fasciitis of right foot   . PTSD (post-traumatic stress disorder)   . PTSD (post-traumatic stress disorder)   . PTSD (post-traumatic stress disorder)   . Takotsubo cardiomyopathy 01/2013   a. 2D echo 02/17/13: EF 30-35%, periapical AK, normal RV size/function, moderate pulmonary HTN. c/w Takotsubo CM. ;  b.  Echo (04/2013): EF 55%, no WMA, Gr 1 DD, mildly dilated ascending aorta (39 mm), mild MR, mild BAE, PASP 33  . Trichomonas vaginitis 10/2014    Past Surgical History:  Procedure Laterality Date  . BRACHIOPLASTY Bilateral 05/2011   Plastic Surgery to remove loose skin after Weight Loss  . CARDIAC CATHETERIZATION  02/17/2013   no angiographic evidence of CAD, EF 35%, HK or the anteroapical wall, apex and inferoapical wall  . FOOT SURGERY Right   . KNEE ARTHROSCOPY Right 02/05/2017   [facility]  . LEFT HEART CATHETERIZATION WITH CORONARY ANGIOGRAM N/A 02/17/2013   Procedure: LEFT HEART CATHETERIZATION WITH CORONARY ANGIOGRAM;  Surgeon: Kathleene Hazel, MD;  Location[facility];  Service: Cardiovascular;  Laterality: N/A;  . MASS EXCISION Right 02/29/2016   Procedure: EXCISION MASS, right wrist;  Surgeon: Betha Loa, MD;  Location: [facility];  Service: Orthopedics;  Laterality: Right;  EXCISION MASS, right wrist  . TOTAL KNEE ARTHROPLASTY Left 03/10/2017   Procedure: TOTAL KNEE ARTHROPLASTY;  Surgeon: Jodi Geralds, MD;  Location: [facility] OR;  Service: Orthopedics;  Laterality: Left;  . TUBAL LIGATION  1989    Family History  Problem Relation Age of Onset  . Breast cancer Mother 82       Recurrence at 28 in opposite breast  . Depression Sister   . Obesity Daughter   . Diabetes Paternal Grandmother   . Stroke Paternal Grandmother   . Prostate cancer Maternal Uncle   . Prostate cancer Maternal Uncle   . Breast cancer Cousin 65       Daughter of maternal aunt with breast cancer  . Breast cancer Maternal Aunt 55  . Breast cancer Maternal Aunt 55    . Breast cancer Half-Sister 13       Breast cancer    Social History   Tobacco Use  . Smoking status: Never Smoker  . Smokeless tobacco: Never Used  Vaping Use  . Vaping Use: Never used  Substance Use Topics  . Alcohol use: No  . 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      Objective:   BP (!) 171/102 (BP Location: Left Arm, Patient Position: Sitting, Cuff Size: Normal)   Pulse 64   Resp 12   Ht 5' 5.5" (1.664 m)   Wt 258 lb (117 kg)   BMI 42.28 kg/m   Physical Exam Constitutional:      Appearance: She is obese.  HENT:     Head: Normocephalic and atraumatic.     Right Ear: Hearing and external ear normal.     Left Ear: Hearing and external ear normal.     Ears:     Comments: Dryness and flaking in right canal obscuring TM Cerumen with small opening to TM--not able to fully visualize TM.    Nose: Nose normal.     Mouth/Throat:     Mouth: Mucous membranes are moist.     Pharynx: Oropharynx is clear.     Comments: Upper partial Eyes:     Extraocular Movements: Extraocular movements intact.     Conjunctiva/sclera: Conjunctivae normal.     Pupils: Pupils are equal, round, and reactive to light.     Comments: Discs sharp  Neck:     Thyroid: No thyromegaly.  Cardiovascular:     Rate and Rhythm: Normal rate and regular rhythm.     Heart sounds: S1 normal and S2 normal. No murmur heard.  No friction rub. No S3 or S4 sounds.      Comments: No carotid bruits.  Carotid, radial, femoral, DP and PT pulses normal and equal.  Pulmonary:     Effort: Pulmonary effort is normal.     Breath sounds: Normal breath sounds.  Chest:     Breasts:        Right: No swelling, bleeding, inverted nipple, mass or skin change.        Left: No swelling, bleeding, inverted nipple, mass or skin  change.  Abdominal:     General: Bowel sounds are normal.     Palpations: Abdomen is soft. There is no hepatomegaly, splenomegaly or mass.     Tenderness: There is no abdominal tenderness.     Hernia: No hernia is present.  Genitourinary:    Comments: Normal external female genitalia No uterine or adnexal mass or tenderness. Musculoskeletal:        General: Normal range of motion.     Cervical back: Full passive range of motion without pain, normal range of motion and neck supple.     Comments: Left knee with large anterior vertical surgical scar.  Lymphadenopathy:     Head:     Right side of head: No submental or submandibular adenopathy.     Left side of head: No submental or submandibular adenopathy.     Cervical: No cervical adenopathy.     Upper Body:     Right upper body: No supraclavicular or axillary adenopathy.     Left upper body: No supraclavicular or axillary adenopathy.     Lower Body: No right inguinal adenopathy. No left inguinal adenopathy.  Skin:    General: Skin is warm.     Capillary Refill: Capillary refill takes less than 2 seconds.     Comments: Scattered seborrheic keratoses on back and under breasts  Neurological:     Mental Status: She is alert and oriented to person, place, and time.     Cranial Nerves: Cranial nerves are intact.     Sensory: Sensation is intact.     Motor: Motor function is intact.  Coordination: Coordination is intact.     Gait: Gait is intact.     Deep Tendon Reflexes: Reflexes are normal and symmetric.  Psychiatric:        Attention and Perception: Attention and perception normal.        Mood and Affect: Mood normal.        Speech: Speech normal.        Behavior: Behavior normal.        Thought Content: Thought content normal.        Judgment: Judgment normal.      Assessment & Plan   1.  CPE without pap Td and influenza vaccine today. Return Monday for [employer]. Mammogram up to date. She will call Dr.  Marge Duncans office to confirm when her next colonoscopy should be--thought it was this year.  2.  Extensive maternal family history of breast cancer and prostate cancer.  Sending patient for genetic counseling and perhaps testing.  3.  Itching of skin:  Suspect due to mild dryness:  Eucerin cream for eczema relief after showering daily.

## 2020-01-17 ENCOUNTER — Telehealth: Payer: Self-pay | Admitting: Genetic Counselor

## 2020-01-17 NOTE — Telephone Encounter (Signed)
Received a genetic counseling appt from Dr. Delrae Alfred for fhx of cancer. Ms. Deborah Williamson has been cld and scheduled to see Clydie Braun on 11/15 at 9am. Pt aware to arrive 15 minutes early.

## 2020-02-07 ENCOUNTER — Inpatient Hospital Stay: Payer: Medicare HMO

## 2020-02-07 ENCOUNTER — Encounter: Payer: Self-pay | Admitting: Genetic Counselor

## 2020-02-07 ENCOUNTER — Inpatient Hospital Stay: Payer: Medicare HMO | Attending: Genetic Counselor | Admitting: Genetic Counselor

## 2020-02-07 ENCOUNTER — Other Ambulatory Visit: Payer: Self-pay

## 2020-02-07 DIAGNOSIS — Z8042 Family history of malignant neoplasm of prostate: Secondary | ICD-10-CM | POA: Diagnosis not present

## 2020-02-07 DIAGNOSIS — Z803 Family history of malignant neoplasm of breast: Secondary | ICD-10-CM | POA: Diagnosis not present

## 2020-02-07 NOTE — Progress Notes (Signed)
REFERRING PROVIDER: Mack Hook, MD [place],  [place] [address]  PRIMARY PROVIDER:  Mack Hook, MD  PRIMARY REASON FOR VISIT:  1. Family history of breast cancer   2. Family history of prostate cancer      HISTORY OF PRESENT ILLNESS:   Deborah Williamson, a 59 y.o. female, was seen for a [place] cancer genetics consultation at the request of Dr. Amil Amen due to a family history of breast and prostate cancer.  Deborah Williamson presents to clinic today to discuss the possibility of a hereditary predisposition to cancer, genetic testing, and to further clarify her future cancer risks, as well as potential cancer risks for family members.   Deborah Williamson is a 59 y.o. female with no personal history of cancer.  She had a category C breast density, and has recently had a mammogram.    CANCER HISTORY:  Oncology History   No history exists.     RISK FACTORS:  Menarche was at age 19.  First live birth at age 15.  OCP use for approximately <5  years.  Ovaries intact: yes.  Hysterectomy: no.  Menopausal status: postmenopausal.  HRT use: 0 years. Colonoscopy: yes; has to come back in one year due to 'finding something'. Mammogram within the last year: yes. Number of breast biopsies: 0. Up to date with pelvic exams: yes. Any excessive radiation exposure in the past: no  Past Medical History:  Diagnosis Date  . Anxiety   . Ascending aortic aneurysm ([place]) 01/2013   3.5-4 cm noted on CT-A  . Bipolar II disorder, most recent episode major depressive ([place])    Followed at [facility].  Ernestine Conrad, counselor:  [phone]  . DJD (degenerative joint disease) of knee 01/02/2017  . Dysrhythmia    "irregular" (02/17/2013)  . Family history of breast cancer   . Family history of prostate cancer   . Headache(784.0)    "probably once/week" (02/17/2013)  . Hyperlipidemia   . Hypertension   . Insomnia   . Left ovarian cyst 01/2013   Incidental CT finding  .  Migraine    "probably once/week" (02/17/2013)  . Morbid obesity ([place]) 01/02/2017  . Normocytic anemia   . NSTEMI (non-ST elevated myocardial infarction) ([place]) 02/16/2013   Takotsubo cardiomyopathy, normal cors  . Obesity   . OCD (obsessive compulsive disorder)   . Plantar fasciitis of right foot   . PTSD (post-traumatic stress disorder)   . PTSD (post-traumatic stress disorder)   . PTSD (post-traumatic stress disorder)   . Takotsubo cardiomyopathy 01/2013   a. 2D echo 02/17/13: EF 30-35%, periapical AK, normal RV size/function, moderate pulmonary HTN. c/w Takotsubo CM. ;  b.  Echo (04/2013): EF 55%, no WMA, Gr 1 DD, mildly dilated ascending aorta (39 mm), mild MR, mild BAE, PASP 33  . Trichomonas vaginitis 10/2014    Past Surgical History:  Procedure Laterality Date  . BRACHIOPLASTY Bilateral 05/2011   Plastic Surgery to remove loose skin after Weight Loss  . CARDIAC CATHETERIZATION  02/17/2013   no angiographic evidence of CAD, EF 35%, HK or the anteroapical wall, apex and inferoapical wall  . FOOT SURGERY Right   . KNEE ARTHROSCOPY Right 02/05/2017   [facility]  . LEFT HEART CATHETERIZATION WITH CORONARY ANGIOGRAM N/A 02/17/2013   Procedure: LEFT HEART CATHETERIZATION WITH CORONARY ANGIOGRAM;  Surgeon: Burnell Blanks, MD;  Location[facility] C[facility] LAB;  Service: Cardiovascular;  Laterality: N/A;  . MASS EXCISION Right 02/29/2016   Procedure: EXCISION MASS, right  wrist;  Surgeon: Leanora Cover, MD;  Location: [place];  Service: Orthopedics;  Laterality: Right;  EXCISION MASS, right wrist  . TOTAL KNEE ARTHROPLASTY Left 03/10/2017   Procedure: TOTAL KNEE ARTHROPLASTY;  Surgeon: Dorna Leitz, MD;  Location: [place];  Service: Orthopedics;  Laterality: Left;  . TUBAL LIGATION  1989    Social History   Socioeconomic History  . Marital status: Legally Separated    Spouse name: Not on file  . Number of children: 2  . Years of education: Not on file  . Highest  education level: Bachelor's degree (e.g., BA, AB, BS)  Occupational History  . Occupation: unemployed/disability  Tobacco Use  . Smoking status: Never Smoker  . Smokeless tobacco: Never Used  Vaping Use  . Vaping Use: Never used  Substance and Sexual Activity  . Alcohol use: No  . Drug use: No  . Sexual activity: Not Currently  Other Topics Concern  . 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 Difficulty of Paying Living Expenses: Not on file  Food Insecurity:   . Worried About Charity fundraiser in the Last Year: Not on file  . Ran Out of Food in the Last Year: Not on file  Transportation Needs:   . Lack of Transportation (Medical): Not on file  . Lack of Transportation (Non-Medical): Not on file  Physical Activity:   . Days of Exercise per Week: Not on file  . Minutes of Exercise per Session: Not on file  Stress:   . Feeling of Stress : Not on file  Social Connections:   . Frequency of Communication with Friends and Family: Not on file  . Frequency of Social Gatherings with Friends and Family: Not on file  . Attends Religious Services: Not on file  . Active Member of Clubs or Organizations: Not on file  . Attends Archivist Meetings: Not on file  . Marital Status: Not on file     FAMILY HISTORY:  We obtained a detailed, 4-generation family history.  Significant diagnoses are listed below: Family History  Problem Relation Age of Onset  . Breast cancer Mother 55       Recurrence at 35 in opposite breast  . Depression Sister   . Obesity Daughter   . Diabetes Paternal Grandmother   . Stroke Paternal Grandmother   . Prostate cancer Maternal Uncle 81  . Prostate cancer Maternal Uncle 81  . Breast cancer Cousin 44       Daughter of maternal aunt with breast cancer  . Breast cancer Maternal Aunt 78    . Breast cancer Maternal Aunt 38  . Breast cancer Half-Sister 46       Breast cancer  . Heart attack Maternal Grandmother   . Other Maternal Grandfather 81       old age  . Breast cancer Cousin 24    The patient has a son and daughter who are cancer free.  She has one full sister who is cancer free, and a paternal half sister who has breast cancer at 59.  Her father is deceased and her mother is living.  The patient's father died when she was 19 of unknown causes.  He had four siblings who are cancer free.  His parents are deceased.  The  patient's mother had breast cancer at 22 and 66.  She has had negative genetic testing in the past.  Her mother had four sisters and two brothers.  The brothers are identical twins and both developed prostate cancer at 77.  One of her mother's sisters had breast cancer in her 52's and the other at 31.  This latter sister had two daughters with breast cancer, one at 66 and one in her 85's.  The maternal grandparents are deceased from non-cancer related issues.  There ar several of her mother's maternal relatives that had breast cancer.  Deborah Williamson is aware of previous family history of genetic testing for hereditary cancer risks. Patient's maternal ancestors are of [place] American and Caucasian descent, and paternal ancestors are of African American descent. There is possible reported Ashkenazi Jewish ancestry. There is no known consanguinity.  GENETIC COUNSELING ASSESSMENT: Deborah Williamson is a 59 y.o. female with a family history of breast and prostate cancer which is somewhat suggestive of a hereditary cancer syndrome and predisposition to cancer given the young ages of onset and number of cases of breast cancer. We, therefore, discussed and recommended the following at today's visit.   DISCUSSION: We discussed that 5 - 10% of breast cancer is hereditary, with most cases associated with BRCA mutations.  There are other genes that can be associated with  hereditary breast cancer syndromes.  These include ATM, CHEK2 and PALB2.  We discussed that testing is beneficial for several reasons including knowing how to follow individuals after completing their treatment, identifying whether potential treatment options such as PARP inhibitors would be beneficial, and understand if other family members could be at risk for cancer and allow them to undergo genetic testing.   We reviewed the characteristics, features and inheritance patterns of hereditary cancer syndromes. We also discussed genetic testing, including the appropriate family members to test, the process of testing, insurance coverage and turn-around-time for results. We discussed the implications of a negative, positive, carrier and/or variant of uncertain significant result. We recommended Deborah Williamson pursue genetic testing for the common hereditary cancer syndrome gene panel. The Common Hereditary Gene Panel offered by [facility] includes sequencing and/or deletion duplication testing of the following 48 genes: APC, ATM, AXIN2, BARD1, BMPR1A, BRCA1, BRCA2, BRIP1, CDH1, CDK4, CDKN2A (p14ARF), CDKN2A (p16INK4a), CHEK2, CTNNA1, DICER1, EPCAM (Deletion/duplication testing only), GREM1 (promoter region deletion/duplication testing only), KIT, MEN1, MLH1, MSH2, MSH3, MSH6, MUTYH, NBN, NF1, NHTL1, PALB2, PDGFRA, PMS2, POLD1, POLE, PTEN, RAD50, RAD51C, RAD51D, RNF43, SDHB, SDHC, SDHD, SMAD4, SMARCA4. STK11, TP53, TSC1, TSC2, and VHL.  The following genes were evaluated for sequence changes only: SDHA and HOXB13 c.251G>A variant only.   Based on Deborah Williamson family history of cancer, she meets medical criteria for genetic testing, however, Medicare's medical criteria typically requires the patient to be affected as well. Therefore, despite that she meets criteria, she may still have an out of pocket cost. We discussed that if her out of pocket cost for testing is over $100, the laboratory will call and confirm  whether she wants to proceed with testing.  If the out of pocket cost of testing is less than $100 she will be billed by the genetic testing laboratory.   Based on the patient's family history, a statistical model (Tyrer Cusik) was used to estimate her risk of developing breast cancer. This estimates her lifetime risk of developing breast cancer to be approximately 31.5%. This estimation does not consider any genetic testing results.  The patient's lifetime breast cancer  risk is a preliminary estimate based on available information using one of several models endorsed by the [place] (ACS). The ACS recommends consideration of breast MRI screening as an adjunct to mammography for patients at high risk (defined as 20% or greater lifetime risk). Please note that a woman's breast cancer risk changes over time. It may increase or decrease based on age and any changes to the personal and/or family medical history. The risks and recommendations listed above apply to this patient at this point in time. In the future, she may or may not be eligible for the same medical management strategies and, in some cases, other medical management strategies may become available to her. If she is interested in an updated breast cancer risk assessment at a later date, she can contact us.  Deborah Williamson has been determined to be at high risk for breast cancer.  Therefore, we recommend that annual screening with mammography and breast MRI be performed. We discussed that Deborah Williamson should discuss her individual situation with her referring physician and determine a breast cancer screening plan with which they are both comfortable.     PLAN: After considering the risks, benefits, and limitations, Deborah Williamson provided informed consent to pursue genetic testing and the blood sample was sent to [facility] for analysis of the common hereditary cancer panel. Results should be available within approximately 2-3  weeks' time, at which point they will be disclosed by telephone to Deborah Williamson, as will any additional recommendations warranted by these results. Deborah Williamson will receive a summary of her genetic counseling visit and a copy of her results once available. This information will also be available in Epic.   Lastly, we encouraged Deborah Williamson to remain in contact with cancer genetics annually so that we can continuously update the family history and inform her of any changes in cancer genetics and testing that may be of benefit for this family.   Deborah Williamson questions were answered to her satisfaction today. Our contact information was provided should additional questions or concerns arise. Thank you for the referral and allowing [place] to share in the care of your patient.   Elizardo Chilson P. Florene Glen, [place], [facility] Licensed, Insurance risk surveyor [email or web address] phone: [phone]  The patient was seen for a total of 60 minutes in face-to-face genetic counseling.  This patient was discussed with Drs. Magrinat, Lindi Adie and/or Burr Medico who agrees with the above.    _______________________________________________________________________ For Office Staff:  Number of people involved in session: 1 Was an Intern/ student involved with case: no

## 2020-02-11 ENCOUNTER — Telehealth: Payer: Self-pay

## 2020-02-11 NOTE — Telephone Encounter (Signed)
   Primary Cardiologist: Verne Carrow, MD  Chart reviewed as part of pre-operative protocol coverage. Patient was contacted 02/11/2020 in reference to pre-operative risk assessment for pending surgery as outlined below.  Deborah Williamson was last seen on 07/12/19 by Dr. Clifton James.  Since that day, Deborah Williamson has done fine from a cardiac standpoint. Despite knee pain, she can complete 4 METs without anginal complaints.  Therefore, based on ACC/AHA guidelines, the patient would be at acceptable risk for the planned procedure without further cardiovascular testing.   The patient was advised that if she develops new symptoms prior to surgery to contact our office to arrange for a follow-up visit, and she verbalized understanding.  If needed, patient can hold aspirin 7 days prior to her upcoming surgery with plans to restart when cleared to do so by her orthopedist.  I will route this recommendation to the requesting party via Epic fax function and remove from pre-op pool. Please call with questions.  Beatriz Stallion, PA-C 02/11/2020, 3:36 PM

## 2020-02-11 NOTE — Telephone Encounter (Signed)
   Primary Cardiologist: Verne Carrow, MD  Chart reviewed as part of pre-operative protocol coverage.   Left a voicemail for patient to call back for ongoing preop assessment.   Beatriz Stallion, PA-C 02/11/2020, 3:30 PM

## 2020-02-11 NOTE — Telephone Encounter (Signed)
Patient returning call.

## 2020-02-11 NOTE — Telephone Encounter (Signed)
   [facility] Pre-operative Risk Assessment    HEARTCARE STAFF: - Please ensure there is not already an duplicate clearance open for this procedure. - Under Visit Info/Reason for Call, type in Other and utilize the format Clearance MM/DD/YY or Clearance TBD. Do not use dashes or single digits. - If request is for dental extraction, please clarify the # of teeth to be extracted.  Request for surgical clearance:  1. What type of surgery is being performed? RIGHT TOTAL KNEE ARTHOPLASTY   2. When is this surgery scheduled? 03/22/20   3. What type of clearance is required (medical clearance vs. Pharmacy clearance to hold med vs. Both)? BOTH  4. Are there any medications that need to be held prior to surgery and how long?ASA   5. Practice name and name of physician performing surgery? [facility]   6. What is the office phone number? [phone]   7.   What is the office fax number? [phone]  8.   Anesthesia type (None, local, MAC, general) ? SPINAL   Jacinta Shoe 02/11/2020, 1:27 PM  _________________________________________________________________   (provider comments below)

## 2020-02-29 ENCOUNTER — Ambulatory Visit: Payer: Self-pay | Admitting: Genetic Counselor

## 2020-02-29 ENCOUNTER — Telehealth: Payer: Self-pay | Admitting: Hematology and Oncology

## 2020-02-29 ENCOUNTER — Telehealth: Payer: Self-pay | Admitting: Genetic Counselor

## 2020-02-29 DIAGNOSIS — Z1379 Encounter for other screening for genetic and chromosomal anomalies: Secondary | ICD-10-CM | POA: Insufficient documentation

## 2020-02-29 NOTE — Telephone Encounter (Signed)
Revealed negative genetic testing.  Discussed that we do not know why she has breast cancer or why there is cancer in the family. It could be due to a different gene that we are not testing, or maybe our current technology may not be able to pick something up.  It will be important for her to keep in contact with genetics to keep up with whether additional testing may be needed.   Patient is still at high risk for breast cancer based on TC risk assessment.  She agrees to referral for high risk clinic.

## 2020-02-29 NOTE — Progress Notes (Signed)
HPI:  Deborah Williamson was previously seen in the [place] clinic due to a family history of breast cancer and concerns regarding a hereditary predisposition to cancer. Please refer to our prior cancer genetics clinic note for more information regarding our discussion, assessment and recommendations, at the time. Deborah Williamson recent genetic test results were disclosed to her, as were recommendations warranted by these results. These results and recommendations are discussed in more detail below.  CANCER HISTORY:  Oncology History   No history exists.    FAMILY HISTORY:  We obtained a detailed, 4-generation family history.  Significant diagnoses are listed below: Family History  Problem Relation Age of Onset  . Breast cancer Mother 65       Recurrence at 34 in opposite breast  . Depression Sister   . Obesity Daughter   . Diabetes Paternal Grandmother   . Stroke Paternal Grandmother   . Prostate cancer Maternal Uncle 81  . Prostate cancer Maternal Uncle 81  . Breast cancer Cousin 56       Daughter of maternal aunt with breast cancer  . Breast cancer Maternal Aunt 17  . Breast cancer Maternal Aunt 25  . Breast cancer Half-Sister 29       Breast cancer  . Heart attack Maternal Grandmother   . Other Maternal Grandfather 69       old age  . Breast cancer Cousin 52    The patient has a son and daughter who are cancer free.  She has one full sister who is cancer free, and a paternal half sister who has breast cancer at 71.  Her father is deceased and her mother is living.  The patient's father died when she was 15 of unknown causes.  He had four siblings who are cancer free.  His parents are deceased.  The patient's mother had breast cancer at 3 and 82.  She has had negative genetic testing in the past.  Her mother had four sisters and two brothers.  The brothers are identical twins and both developed prostate cancer at 98.  One of her mother's sisters had breast cancer  in her 86's and the other at 37.  This latter sister had two daughters with breast cancer, one at 59 and one in her 69's.  The maternal grandparents are deceased from non-cancer related issues.  There ar several of her mother's maternal relatives that had breast cancer.  Deborah Williamson is aware of previous family history of genetic testing for hereditary cancer risks. Patient's maternal ancestors are of [place] American and Caucasian descent, and paternal ancestors are of African American descent. There is possible reported Ashkenazi Jewish ancestry. There is no known consanguinity.    GENETIC TEST RESULTS: Genetic testing reported out on February 27, 2020 through the Common hereditary cancer panel found no pathogenic mutations. The Common Hereditary Gene Panel offered by [facility] includes sequencing and/or deletion duplication testing of the following 48 genes: APC, ATM, AXIN2, BARD1, BMPR1A, BRCA1, BRCA2, BRIP1, CDH1, CDK4, CDKN2A (p14ARF), CDKN2A (p16INK4a), CHEK2, CTNNA1, DICER1, EPCAM (Deletion/duplication testing only), GREM1 (promoter region deletion/duplication testing only), KIT, MEN1, MLH1, MSH2, MSH3, MSH6, MUTYH, NBN, NF1, NHTL1, PALB2, PDGFRA, PMS2, POLD1, POLE, PTEN, RAD50, RAD51C, RAD51D, RNF43, SDHB, SDHC, SDHD, SMAD4, SMARCA4. STK11, TP53, TSC1, TSC2, and VHL.  The following genes were evaluated for sequence changes only: SDHA and HOXB13 c.251G>A variant only. The test report has been scanned into EPIC and is located under the Molecular Pathology section of the Results Review  tab.  A portion of the result report is included below for reference.     We discussed with Deborah Williamson that because current genetic testing is not perfect, it is possible there may be a gene mutation in one of these genes that current testing cannot detect, but that chance is small.  We also discussed, that there could be another gene that has not yet been discovered, or that we have not yet tested, that is  responsible for the cancer diagnoses in the family. It is also possible there is a hereditary cause for the cancer in the family that Deborah Williamson did not inherit and therefore was not identified in her testing.  Therefore, it is important to remain in touch with cancer genetics in the future so that we can continue to offer Deborah Williamson the most up to date genetic testing.   ADDITIONAL GENETIC TESTING: We discussed with Deborah Williamson that her genetic testing was fairly extensive.  If there are genes identified to increase cancer risk that can be analyzed in the future, we would be happy to discuss and coordinate this testing at that time.    CANCER SCREENING RECOMMENDATIONS: Deborah Williamson test result is considered negative (normal).  This means that we have not identified a hereditary cause for her family history of breast cancer at this time. Most cancers happen by chance and this negative test suggests that her cancer may fall into this category.    While reassuring, this does not definitively rule out a hereditary predisposition to cancer. It is still possible that there could be genetic mutations that are undetectable by current technology. There could be genetic mutations in genes that have not been tested or identified to increase cancer risk.  Therefore, it is recommended she continue to follow the cancer management and screening guidelines provided by her oncology and primary healthcare provider.   An individual's cancer risk and medical management are not determined by genetic test results alone. Overall cancer risk assessment incorporates additional factors, including personal medical history, family history, and any available genetic information that may result in a personalized plan for cancer prevention and surveillance  Based on Deborah Williamson family of cancer, as well as her genetic test results, statistical models Deborah Williamson)  and literature data were used to estimate her risk of  developing breast cancer. These estimate her lifetime risk of developing breast cancer to be approximately 31.5%.  The patient's lifetime breast cancer risk is a preliminary estimate based on available information using one of several models endorsed by the [employer] (ACS). The ACS recommends consideration of breast MRI screening as an adjunct to mammography for patients at high risk (defined as 20% or greater lifetime risk). A more detailed breast cancer risk assessment can be considered, if clinically indicated.   Deborah Williamson has been determined to be at high risk for breast cancer.  We, therefore, discussed that it is reasonable for Deborah Williamson to be followed by a high-risk breast cancer clinic; in addition to a yearly mammogram and physical exam by a healthcare provider, she should discuss the usefulness of an annual breast MRI with the high-risk clinic providers.     RECOMMENDATIONS FOR FAMILY MEMBERS:  Individuals in this family might be at some increased risk of developing cancer, over the general population risk, simply due to the family history of cancer.  We recommended women in this family have a yearly mammogram beginning at age 4, or 16 years younger than the earliest  onset of cancer, an annual clinical breast exam, and perform monthly breast self-exams. Women in this family should also have a gynecological exam as recommended by their primary provider. All family members should be referred for colonoscopy starting at age 25.  It is also possible there is a hereditary cause for the cancer in Deborah Williamson family that she did not inherit and therefore was not identified in her. Her mother, who had been diagnosed with breast cancer, has had genetic testing and was negative as well.  Based on Deborah Williamson family history, we recommended her cousins, who were diagnosed with breast cancer, have genetic counseling and testing. Deborah Williamson will let us know if we can be of any  assistance in coordinating genetic counseling and/or testing for this family member.   FOLLOW-UP: Lastly, we discussed with Deborah Williamson that cancer genetics is a rapidly advancing field and it is possible that new genetic tests will be appropriate for her and/or her family members in the future. We encouraged her to remain in contact with cancer genetics on an annual basis so we can update her personal and family histories and let her know of advances in cancer genetics that may benefit this family.   Our contact number was provided. Deborah Williamson questions were answered to her satisfaction, and she knows she is welcome to call us at anytime with additional questions or concerns.   Roma Kayser, [place], [facility] Licensed, Certified Genetic Counselor [email or web address]

## 2020-02-29 NOTE — Telephone Encounter (Signed)
Received a referral from Clydie Braun for Deborah Williamson to be seen in the high risk clinic. Pt has been cld and scheduled to see Dr. Al Pimple on 1/28 at 9am. Pt aware to arrive 15 minutes early.

## 2020-03-06 LAB — HM COLONOSCOPY

## 2020-03-20 ENCOUNTER — Encounter: Payer: Medicare HMO | Admitting: Hematology and Oncology

## 2020-03-20 ENCOUNTER — Telehealth: Payer: Self-pay | Admitting: Cardiovascular Disease

## 2020-03-20 NOTE — Telephone Encounter (Signed)
Follow Up:     Darel Hong calling to see what is the status of pt's clearance for this Wednesday. She need this asp fax to  [phone].

## 2020-03-20 NOTE — Telephone Encounter (Signed)
Cardiac clearance was completed 02/11/20. See separate telephone encounter from that date. Left VM with Darel Hong from Orthopedic office to let her know I have re-faxed the clearance.   Alver Sorrow, NP

## 2020-04-06 ENCOUNTER — Telehealth: Payer: Self-pay | Admitting: Hematology and Oncology

## 2020-04-06 NOTE — Telephone Encounter (Signed)
Rescheduled appointments per MD schedule change. Called patient, no answer. Left message with updated appointment date and time.

## 2020-04-17 ENCOUNTER — Telehealth: Payer: Self-pay | Admitting: Hematology and Oncology

## 2020-04-17 NOTE — Telephone Encounter (Signed)
Pt cld to reschedule her high risk appt to 3/8 at 10am due to recent knee surgery.

## 2020-04-20 ENCOUNTER — Encounter: Payer: Medicare HMO | Admitting: Hematology and Oncology

## 2020-04-21 ENCOUNTER — Encounter: Payer: Medicare HMO | Admitting: Hematology and Oncology

## 2020-05-01 ENCOUNTER — Telehealth: Payer: Self-pay | Admitting: Internal Medicine

## 2020-05-01 NOTE — Telephone Encounter (Signed)
We have not given to her and do not have here.  She should obtain at pharmacy of her choice that carries and get the second one 2-6 months later.

## 2020-05-01 NOTE — Telephone Encounter (Signed)
Patient called asking if she has had a shingles vaccine, if not, if we can schedule an appointment for her to come get it.

## 2020-05-05 NOTE — Telephone Encounter (Signed)
Called patient and notified her of Doctor's response and recommendations. Patient stated that she will reach out to her pharmacy for it.

## 2020-05-29 ENCOUNTER — Telehealth: Payer: Self-pay | Admitting: Hematology and Oncology

## 2020-05-29 NOTE — Telephone Encounter (Signed)
Deborah Williamson cld to cancel her new pt appt w/Dr. Al Pimple on 3/8.

## 2020-05-30 ENCOUNTER — Encounter: Payer: Medicare HMO | Admitting: Hematology and Oncology

## 2020-06-08 ENCOUNTER — Other Ambulatory Visit: Payer: Medicare HMO | Admitting: *Deleted

## 2020-06-08 ENCOUNTER — Other Ambulatory Visit: Payer: Self-pay

## 2020-06-08 ENCOUNTER — Other Ambulatory Visit: Payer: Self-pay | Admitting: *Deleted

## 2020-06-08 ENCOUNTER — Telehealth: Payer: Self-pay | Admitting: Cardiovascular Disease

## 2020-06-08 DIAGNOSIS — R0602 Shortness of breath: Secondary | ICD-10-CM

## 2020-06-08 DIAGNOSIS — R7989 Other specified abnormal findings of blood chemistry: Secondary | ICD-10-CM

## 2020-06-08 DIAGNOSIS — R079 Chest pain, unspecified: Secondary | ICD-10-CM

## 2020-06-08 LAB — CBC
Hematocrit: 34.6 % (ref 34.0–46.6)
Hemoglobin: 11.4 g/dL (ref 11.1–15.9)
MCH: 25.7 pg — ABNORMAL LOW (ref 26.6–33.0)
MCHC: 32.9 g/dL (ref 31.5–35.7)
MCV: 78 fL — ABNORMAL LOW (ref 79–97)
Platelets: 312 10*3/uL (ref 150–450)
RBC: 4.44 x10E6/uL (ref 3.77–5.28)
RDW: 15.9 % — ABNORMAL HIGH (ref 11.7–15.4)
WBC: 5.2 10*3/uL (ref 3.4–10.8)

## 2020-06-08 LAB — D-DIMER, QUANTITATIVE: D-DIMER: >4.4 mg/L FEU — ABNORMAL HIGH (ref 0.00–0.49)

## 2020-06-08 NOTE — Progress Notes (Signed)
Cardiology Office Note:    Date:  06/09/2020   ID:  Deborah Williamson, DOB June 03, 1960, MRN [id number]  PCP:  Deborah Manson, MD    [facility]  Cardiologist:  Deborah Carrow, MD   Electrophysiologist:  None       Referring MD: Deborah Manson, MD   Chief Complaint:  Shortness of Breath    Patient Profile:    Deborah Williamson is a 60 y.o. female with:   Thoracic aortic aneurysm   CT 6/19: 4 cm   CT 6/21: 4 cm   CT 3/22: 4.1 cm   Hypertension   Tako-tsubo CM  NSTEMI 11/14 >> Cath 11/14: no CAD  EF 30-35 (01/2013)  EF 55-60 (12/2018)  Bipolar d/o   Prior CV studies: Echocardiogram 01/20/2019 EF 55-60, no RWMA, normal RVSF, trivial eff, mild MR, mild TR, ascending aorta 39 mm, RVSP 25.3, GLS -21.1%  Echocardiogram 09/11/16 EF 55-60  Echocardiogram 05/07/13 EF 55  LHC (02/16/2013):  No angiographic CAD, EF 35% with anteroapical, apical and inferoapical HK.  Echocardiogram (02/17/2013):  EF 30-35%, apical septal AK, apical lateral AK, apical inferior AK, apical anterior AK, AK of the true apex, grade 1 diastolic dysfunction, trivial MR, normal RVSF, PASP 50.    History of Present Illness:    Deborah Williamson was last seen by Dr. Clifton Williamson in 4/21.  She called in recently with shortness of breath and is now seen for evaluation.  Of note, she came in yesterday for a CBC and DDimer.  Her hemoglobin was normal.  Her D-dimer was elevated.  Chest CT was arranged and done before this visit.  This demonstrated no pulmonary embolism.  She had right knee replacement surgery as an outpatient in December.  She has had some shortness of breath since then but recently it seems to have gotten worse.  She notes dyspnea with just minimal activity.  She has not had orthopnea, leg edema or weight increase.  Her appetite has been poor.  She does feel some heaviness when she gets short of breath.  She has not had syncope.  She has had a  nonproductive cough as well as increased sinus drainage.  She is not had any fevers.  She has not had melena or hematochezia or hematuria.      Past Medical History:  Diagnosis Date  . Anxiety   . Ascending aortic aneurysm (HCC) 01/2013   3.5-4 cm noted on CT-A  . Bipolar II disorder, most recent episode major depressive (HCC)    Followed at [facility].  Cline Crock, counselor:  [phone]  . DJD (degenerative joint disease) of knee 01/02/2017  . Dysrhythmia    "irregular" (02/17/2013)  . Family history of breast cancer   . Family history of prostate cancer   . Headache(784.0)    "probably once/week" (02/17/2013)  . Hyperlipidemia   . Hypertension   . Insomnia   . Left ovarian cyst 01/2013   Incidental CT finding  . Migraine    "probably once/week" (02/17/2013)  . Morbid obesity (HCC) 01/02/2017  . Normocytic anemia   . NSTEMI (non-ST elevated myocardial infarction) (HCC) 02/16/2013   Takotsubo cardiomyopathy, normal cors  . Obesity   . OCD (obsessive compulsive disorder)   . Plantar fasciitis of right foot   . PTSD (post-traumatic stress disorder)   . PTSD (post-traumatic stress disorder)   . PTSD (post-traumatic stress disorder)   . Takotsubo cardiomyopathy 01/2013   a. 2D echo 02/17/13: EF 30-35%, periapical AK, normal RV  size/function, moderate pulmonary HTN. c/w Takotsubo CM. ;  b.  Echo (04/2013): EF 55%, no WMA, Gr 1 DD, mildly dilated ascending aorta (39 mm), mild MR, mild BAE, PASP 33  . Trichomonas vaginitis 10/2014    Current Medications: Current Meds  Medication Sig  . acetaminophen (TYLENOL) 500 MG tablet Take 1,000 mg by mouth 2 (two) times daily as needed for moderate pain or headache.  Marland Kitchen. amLODipine (NORVASC) 5 MG tablet Take 1 tablet (5 mg total) by mouth daily.  . Ascorbic Acid (VITAMIN C ADULT GUMMIES PO) Take by mouth. 2 daily  . aspirin EC 81 MG tablet Take 81 mg by mouth daily.  . busPIRone (BUSPAR) 15 MG tablet 15 mg 3 (three) times daily.   .  carvedilol (COREG) 12.5 MG tablet Take 1 tablet (12.5 mg total) by mouth 2 (two) times daily.  . diclofenac Sodium (VOLTAREN) 1 % GEL Apply 2 g topically 4 (four) times daily. Apply to hand twice daily  . FIBER ADULT GUMMIES PO Take by mouth. 2 daily  . lithium carbonate 300 MG capsule 300 mg. 1 daily at bedtime  . loratadine (CLARITIN) 10 MG tablet Take 1 tablet (10 mg total) by mouth daily.  Marland Kitchen. losartan (COZAAR) 100 MG tablet Take 1 tablet (100 mg total) by mouth daily.  . Melatonin 5 MG TABS Take 15 mg by mouth at bedtime as needed (sleep).  . meloxicam (MOBIC) 15 MG tablet Take 15 mg by mouth daily as needed for pain.  . Multiple Vitamin (MULTIVITAMIN WITH MINERALS) TABS tablet Take 1 tablet by mouth daily.  . Multiple Vitamins-Minerals (HAIR SKIN AND NAILS FORMULA) TABS Take 1 tablet by mouth daily.  Marland Kitchen. OVER THE COUNTER MEDICATION 2 (two) times daily. Apple cider vinegar gummy  . prazosin (MINIPRESS) 2 MG capsule at bedtime.   Marland Kitchen. REXULTI 2 MG TABS daily.   . rosuvastatin (CRESTOR) 10 MG tablet Take 1 tablet (10 mg total) by mouth daily.  . sertraline (ZOLOFT) 100 MG tablet Take 100 mg by mouth. 2 tablets daily in the morning  . tiZANidine (ZANAFLEX) 4 MG tablet Take 4 mg by mouth every 8 (eight) hours as needed for muscle spasms.  . traMADol (ULTRAM) 50 MG tablet SMARTSIG:1-2 Tablet(s) By Mouth Every Evening  . traZODone (DESYREL) 100 MG tablet Take 100 mg by mouth. 3 tablets at bedtime     Allergies:   Lisinopril and Vicodin [hydrocodone-acetaminophen]   Social History   Tobacco Use  . Smoking status: Never Smoker  . Smokeless tobacco: Never Used  Vaping Use  . Vaping Use: Never used  Substance Use Topics  . Alcohol use: No  . Drug use: No     Family Hx: The patient's family history includes Breast cancer (age of onset: 1928) in her cousin; Breast cancer (age of onset: 2645) in her maternal aunt; Breast cancer (age of onset: 2355) in her half-sister, maternal aunt, and mother; Breast  cancer (age of onset: 1863) in her cousin; Depression in her sister; Diabetes in her paternal grandmother; Heart attack in her maternal grandmother; Obesity in her daughter; Other (age of onset: 3290) in her maternal grandfather; Prostate cancer (age of onset: 6881) in her maternal uncle and maternal uncle; Stroke in her paternal grandmother.  ROS   EKGs/Labs/Other Test Reviewed:    EKG:  EKG is   ordered today.  The ekg ordered today demonstrates normal sinus rhythm, heart rate 72, normal axis, PAC, nonspecific ST-T wave changes, QTC 438  Recent Labs: 07/16/2019:  ALT 10 08/31/2019: BUN 16; Creatinine, Ser 0.77; Potassium 4.1; Sodium 138 06/08/2020: Hemoglobin 11.4; Platelets 312   Recent Lipid Panel Lab Results  Component Value Date/Time   CHOL 136 01/15/2019 03:14 PM   TRIG 60 01/15/2019 03:14 PM   HDL 54 01/15/2019 03:14 PM   CHOLHDL 3 05/07/2013 10:57 AM   LDLCALC 69 01/15/2019 03:14 PM   CT ANGIO CHEST PE W OR WO CONTRAST 06/09/2020  Narrative CLINICAL DATA:  Shortness of breath and chest pain  EXAM: CT ANGIOGRAPHY CHEST WITH CONTRAST  TECHNIQUE: Multidetector CT imaging of the chest was performed using the standard protocol during bolus administration of intravenous contrast. Multiplanar CT image reconstructions and MIPs were obtained to evaluate the vascular anatomy.  CONTRAST:  64mL OMNIPAQUE IOHEXOL 350 MG/ML SOLN  COMPARISON:  09/01/2019  FINDINGS: Cardiovascular: Satisfactory opacification of the pulmonary arteries to the segmental level. No evidence of pulmonary embolism. Stable cardiomegaly. No pericardial effusion. Similar dilatation of the ascending thoracic aorta measuring 4.1 cm. Similar mild dilatation of the main pulmonary artery.  Mediastinum/Nodes: No adenopathy. Visualized thyroid is unremarkable. Esophagus is.  Lungs/Pleura: No consolidation or mass. No pleural effusion or pneumothorax.  Upper Abdomen: No acute abnormality.  Musculoskeletal: No  acute osseous abnormality  Review of the MIP images confirms the above findings.  IMPRESSION: No evidence of acute pulmonary embolism or other acute abnormality.  Stable mild dilatation of the ascending thoracic aorta. Recommend annual imaging followup by CTA or MRA. This recommendation follows 2010 ACCF/AHA/AATS/ACR/ASA/SCA/SCAI/SIR/STS/SVM Guidelines for the Diagnosis and Management of Patients with Thoracic Aortic Disease. Circulation. 2010; 121: B704-U889. Aortic aneurysm NOS (ICD10-I71.9)  Stable cardiomegaly.   Electronically Signed By: Guadlupe Spanish M.D. On: 06/09/2020 11:34     Risk Assessment/Calculations:      Physical Exam:    VS:  BP (!) 166/108   Pulse 72   Ht 5\' 6"  (1.676 m)   Wt 234 lb 12.8 oz (106.5 kg)   SpO2 98%   BMI 37.90 kg/m     Wt Readings from Last 3 Encounters:  06/09/20 234 lb 12.8 oz (106.5 kg)  01/14/20 258 lb (117 kg)  07/16/19 264 lb (119.7 kg)     Constitutional:      Appearance: Healthy appearance. Not in distress.  Neck:     Vascular: No JVR. JVD normal.  Pulmonary:     Effort: Pulmonary effort is normal.     Breath sounds: No wheezing. No rales.  Cardiovascular:     Normal rate. Regular rhythm. Normal S1. Normal S2.     Murmurs: There is no murmur.  Edema:    Peripheral edema absent.  Abdominal:     Palpations: Abdomen is soft. There is no hepatomegaly.  Skin:    General: Skin is warm and dry.  Neurological:     Mental Status: Alert and oriented to person, place and time.     Cranial Nerves: Cranial nerves are intact.       ASSESSMENT & PLAN:    1. Shortness of breath 2. Chest pain, unspecified type She had knee surgery in December.  She has been more short of breath recently.  She tried to go to physical therapy and had to stop due to dyspnea.  She does not have any signs of volume excess on exam.  Her chest CT today was negative for pulmonary embolism.  She had no edema or consolidation on her lung windows.  Her  thoracic aorta measurement is stable.  She had no CAD on  cardiac catheterization in 2014.  She is not diabetic and she does not smoke.  Her electrocardiogram does not demonstrate any acute changes.  Her blood pressure is significantly elevated.  I suspect her symptoms are all related to uncontrolled blood pressure.  I will adjust her medications as outlined below.  I will also arrange a 2D echocardiogram.  Unfortunately, due to scheduling, we will not likely have the results back prior to her next visit.  If she has reduced ejection fraction or worsening symptoms despite well-controlled blood pressure, we may need to consider cardiac catheterization.  Otherwise, if she continues to have chest symptoms, we will likely need to proceed with stress testing.  Follow-up in 2 to 3 weeks.  3. Essential hypertension Blood pressure is uncontrolled.  Her session with physical therapy was stopped early due to elevated blood pressure.  Continue current dose of losartan, carvedilol, prazosin.  Start amlodipine 5 mg daily.  Low-salt diet.  We will try to arrange getting her blood pressure cuff to monitor her blood pressures at home.  Follow-up in 2 to 3 weeks.  4. Takotsubo cardiomyopathy She had full recovery of her LV function.  Most recent echocardiogram in 2020 demonstrated EF 55-60.  5. Ascending aortic aneurysm (HCC) Stable by CT scan done earlier today (4.1 cm).       Dispo:  Return in about 2 weeks (around 06/23/2020) for Close Follow Up w/ Dr. Clifton Williamson, or Tereso Newcomer, PA-C, in person.   Medication Adjustments/Labs and Tests Ordered: Current medicines are reviewed at length with the patient today.  Concerns regarding medicines are outlined above.  Tests Ordered: Orders Placed This Encounter  Procedures  . ECHOCARDIOGRAM COMPLETE   Medication Changes: Meds ordered this encounter  Medications  . amLODipine (NORVASC) 5 MG tablet    Sig: Take 1 tablet (5 mg total) by mouth daily.    Dispense:  90  tablet    Refill:  3    Signed, Tereso Newcomer, PA-C  06/09/2020 1:16 PM    [facility] [address] [place], [place], [place]  [address] Phone: [phone]; Fax: [phone]

## 2020-06-08 NOTE — Telephone Encounter (Signed)
Pt c/o Shortness Of Breath: STAT if SOB developed within the last 24 hours or pt is noticeably SOB on the phone  1. Are you currently SOB (can you hear that pt is SOB on the phone)? Pt states yes but not noticeably SOB over the phone.  2. How long have you been experiencing SOB? Since January, had knee replacement surgery 03/22/20 and is not sure if this has contributed to it.   3. Are you SOB when sitting or when up moving around? Up moving around - upon ANY exertion. Did physical therapy today and was out of breath and the PT said this should not be happening  4. Are you currently experiencing any other symptoms? Exhausted and headache on right side. At Physical Therapy today her BP was 152/101 then 10 mins later it was 160/110. States HR was fine.   Patient scheduled for appt tomorrow at 12:15pm with Tereso Newcomer.

## 2020-06-08 NOTE — Telephone Encounter (Signed)
Add on for tomorrow 03/18 at 12:15pm with Tereso Newcomer.

## 2020-06-08 NOTE — Progress Notes (Signed)
Pt's DDimer elevated.  Spoke with Dr. Katrinka Blazing and he said to order CT to r/o PE.  Order placed.  Called pt and made her aware.  Pt aware to present to ED if SOB worsens or becomes persistent.  [place], Environmental education officer will touch base with CT dept in the morning about getting CT done prior to seeing Tereso Newcomer, PA-C if possible.

## 2020-06-08 NOTE — Telephone Encounter (Addendum)
152/101, 160/100  Bps today.  Tends to run high.    This SOB is new and with activities like trying to do PT, walking the dog.  It is SOB that stops her activity immediately.  Recovers w rest.  She is 11 weeks post knee replacement surgery  No pulse ox at home.  There was no SOB at all prior to the surgery. Has not been back to see PCP or any other doctors other than orthopedics.  No recent labs.    She is scheduled with APP tomorrow.  Reviewed with Dr. Katrinka Blazing (DOD).  Labs ordered.  Pt will come today for DDimer and CBC.  She is appreciative for the call/assistance.

## 2020-06-09 ENCOUNTER — Ambulatory Visit (INDEPENDENT_AMBULATORY_CARE_PROVIDER_SITE_OTHER): Payer: Medicare HMO | Admitting: Physician Assistant

## 2020-06-09 ENCOUNTER — Encounter: Payer: Self-pay | Admitting: Physician Assistant

## 2020-06-09 ENCOUNTER — Inpatient Hospital Stay: Admission: RE | Admit: 2020-06-09 | Payer: Medicare HMO | Source: Ambulatory Visit

## 2020-06-09 ENCOUNTER — Ambulatory Visit (HOSPITAL_COMMUNITY)
Admission: RE | Admit: 2020-06-09 | Discharge: 2020-06-09 | Disposition: A | Discharge status: Home / Self Care | Payer: Medicare HMO | Source: Ambulatory Visit | Attending: Interventional Cardiology | Admitting: Interventional Cardiology

## 2020-06-09 VITALS — BP 166/108 | HR 72 | Ht 66.0 in | Wt 234.8 lb

## 2020-06-09 DIAGNOSIS — I5181 Takotsubo syndrome: Secondary | ICD-10-CM

## 2020-06-09 DIAGNOSIS — R0602 Shortness of breath: Secondary | ICD-10-CM | POA: Diagnosis not present

## 2020-06-09 DIAGNOSIS — R079 Chest pain, unspecified: Secondary | ICD-10-CM

## 2020-06-09 DIAGNOSIS — I712 Thoracic aortic aneurysm, without rupture: Secondary | ICD-10-CM

## 2020-06-09 DIAGNOSIS — I10 Essential (primary) hypertension: Secondary | ICD-10-CM

## 2020-06-09 DIAGNOSIS — R7989 Other specified abnormal findings of blood chemistry: Secondary | ICD-10-CM | POA: Diagnosis present

## 2020-06-09 DIAGNOSIS — I7121 Aneurysm of the ascending aorta, without rupture: Secondary | ICD-10-CM

## 2020-06-09 MED ORDER — IOHEXOL 350 MG/ML SOLN
63.0000 mL | Freq: Once | INTRAVENOUS | Status: AC | PRN
  Administered 2020-06-09: 63 mL via INTRAVENOUS

## 2020-06-09 MED ORDER — AMLODIPINE BESYLATE 5 MG PO TABS
5.0000 mg | ORAL_TABLET | Freq: Every day | ORAL | 3 refills | Status: DC
Start: 2020-06-09 — End: 2020-10-11

## 2020-06-09 NOTE — Telephone Encounter (Signed)
Please see my OV note from 06/09/2020. Tereso Newcomer, PA-C    06/09/2020 1:17 PM

## 2020-06-09 NOTE — Progress Notes (Addendum)
Patient will have to have a iSTAT Creatine at hospital with chest CT to R/o PE.  See previous note.   Patient scheduled at [facility] and aware to head to hospital now.

## 2020-06-09 NOTE — Patient Instructions (Signed)
Medication Instructions:  Your physician has recommended you make the following change in your medication:  1.  Start Amlodipine one tablet by mouth ( 5 mg ) daily, sent in # 90 to requested pharmacy.  *If you need a refill on your cardiac medications before your next appointment, please call your pharmacy*   Lab Work: -None  If you have labs (blood work) drawn today and your tests are completely normal, you will receive your results only by: Marland Kitchen MyChart Message (if you have MyChart) OR . A paper copy in the mail If you have any lab test that is abnormal or we need to change your treatment, we will call you to review the results.   Testing/Procedures: Your physician has requested that you have an echocardiogram. Wednesday, April 13 @ 3:45 pm. Echocardiography is a painless test that uses sound waves to create images of your heart. It provides your doctor with information about the size and shape of your heart and how well your heart's chambers and valves are working. This procedure takes approximately one hour. There are no restrictions for this procedure.   Echocardiogram An echocardiogram is a test that uses sound waves (ultrasound) to produce images of the heart. Images from an echocardiogram can provide important information about:  Heart size and shape.  The size and thickness and movement of your heart's walls.  Heart muscle function and strength.  Heart valve function or if you have stenosis. Stenosis is when the heart valves are too narrow.  If blood is flowing backward through the heart valves (regurgitation).  A tumor or infectious growth around the heart valves.  Areas of heart muscle that are not working well because of poor blood flow or injury from a heart attack.  Aneurysm detection. An aneurysm is a weak or damaged part of an artery wall. The wall bulges out from the normal force of blood pumping through the body. Tell a health care provider about:  Any  allergies you have.  All medicines you are taking, including vitamins, herbs, eye drops, creams, and over-the-counter medicines.  Any blood disorders you have.  Any surgeries you have had.  Any medical conditions you have.  Whether you are pregnant or may be pregnant. What are the risks? Generally, this is a safe test. However, problems may occur, including an allergic reaction to dye (contrast) that may be used during the test. What happens before the test? No specific preparation is needed. You may eat and drink normally. What happens during the test?  You will take off your clothes from the waist up and put on a hospital gown.  Electrodes or electrocardiogram (ECG)patches may be placed on your chest. The electrodes or patches are then connected to a device that monitors your heart rate and rhythm.  You will lie down on a table for an ultrasound exam. A gel will be applied to your chest to help sound waves pass through your skin.  A handheld device, called a transducer, will be pressed against your chest and moved over your heart. The transducer produces sound waves that travel to your heart and bounce back (or "echo" back) to the transducer. These sound waves will be captured in real-time and changed into images of your heart that can be viewed on a video monitor. The images will be recorded on a computer and reviewed by your health care provider.  You may be asked to change positions or hold your breath for a short time. This makes it easier  to get different views or better views of your heart.  In some cases, you may receive contrast through an IV in one of your veins. This can improve the quality of the pictures from your heart. The procedure may vary among health care providers and hospitals.   What can I expect after the test? You may return to your normal, everyday life, including diet, activities, and medicines, unless your health care provider tells you not to do  that. Follow these instructions at home:  It is up to you to get the results of your test. Ask your health care provider, or the department that is doing the test, when your results will be ready.  Keep all follow-up visits. This is important. Summary  An echocardiogram is a test that uses sound waves (ultrasound) to produce images of the heart.  Images from an echocardiogram can provide important information about the size and shape of your heart, heart muscle function, heart valve function, and other possible heart problems.  You do not need to do anything to prepare before this test. You may eat and drink normally.  After the echocardiogram is completed, you may return to your normal, everyday life, unless your health care provider tells you not to do that. This information is not intended to replace advice given to you by your health care provider. Make sure you discuss any questions you have with your health care provider. Document Revised: 11/02/2019 Document Reviewed: 11/02/2019 Elsevier Patient Education  2021 Elsevier Inc.    Follow-Up: At [facility], you and your health needs are our priority.  As part of our continuing mission to provide you with exceptional heart care, we have created designated Provider Care Teams.  These Care Teams include your primary Cardiologist (physician) and Advanced Practice Providers (APPs -  Physician Assistants and Nurse Practitioners) who all work together to provide you with the care you need, when you need it.  We recommend signing up for the patient portal called "MyChart".  Sign up information is provided on this After Visit Summary.  MyChart is used to connect with patients for Virtual Visits (Telemedicine).  Patients are able to view lab/test results, encounter notes, upcoming appointments, etc.  Non-urgent messages can be sent to your provider as well.   To learn more about what you can do with MyChart, go to [email or web address].     Your next appointment with Tereso Newcomer, PA on Tuesday, April 5 @ 2:15.     Other Instructions Two Gram Sodium Diet 2000 mg  What is Sodium? Sodium is a mineral found naturally in many foods. The most significant source of sodium in the diet is table salt, which is about 40% sodium.  Processed, convenience, and preserved foods also contain a large amount of sodium.  The body needs only 500 mg of sodium daily to function,  A normal diet provides more than enough sodium even if you do not use salt.  Why Limit Sodium? A build up of sodium in the body can cause thirst, increased blood pressure, shortness of breath, and water retention.  Decreasing sodium in the diet can reduce edema and risk of heart attack or stroke associated with high blood pressure.  Keep in mind that there are many other factors involved in these health problems.  Heredity, obesity, lack of exercise, cigarette smoking, stress and what you eat all play a role.  General Guidelines:  Do not add salt at the table or in cooking.  One teaspoon of salt  contains over 2 grams of sodium.  Read food labels  Avoid processed and convenience foods  Ask your dietitian before eating any foods not dicussed in the menu planning guidelines  Consult your physician if you wish to use a salt substitute or a sodium containing medication such as antacids.  Limit milk and milk products to 16 oz (2 cups) per day.  Shopping Hints:  READ LABELS!! "Dietetic" does not necessarily mean low sodium.  Salt and other sodium ingredients are often added to foods during processing.   Menu Planning Guidelines Food Group Choose More Often Avoid  Beverages (see also the milk group All fruit juices, low-sodium, salt-free vegetables juices, low-sodium carbonated beverages Regular vegetable or tomato juices, commercially softened water used for drinking or cooking  Breads and Cereals Enriched white, wheat, rye and pumpernickel bread, hard rolls and  dinner rolls; muffins, cornbread and waffles; most dry cereals, cooked cereal without added salt; unsalted crackers and breadsticks; low sodium or homemade bread crumbs Bread, rolls and crackers with salted tops; quick breads; instant hot cereals; pancakes; commercial bread stuffing; self-rising flower and biscuit mixes; regular bread crumbs or cracker crumbs  Desserts and Sweets Desserts and sweets mad with mild should be within allowance Instant pudding mixes and cake mixes  Fats Butter or margarine; vegetable oils; unsalted salad dressings, regular salad dressings limited to 1 Tbs; light, sour and heavy cream Regular salad dressings containing bacon fat, bacon bits, and salt pork; snack dips made with instant soup mixes or processed cheese; salted nuts  Fruits Most fresh, frozen and canned fruits Fruits processed with salt or sodium-containing ingredient (some dried fruits are processed with sodium sulfites        Vegetables Fresh, frozen vegetables and low- sodium canned vegetables Regular canned vegetables, sauerkraut, pickled vegetables, and others prepared in brine; frozen vegetables in sauces; vegetables seasoned with ham, bacon or salt pork  Condiments, Sauces, Miscellaneous  Salt substitute with physician's approval; pepper, herbs, spices; vinegar, lemon or lime juice; hot pepper sauce; garlic powder, onion powder, low sodium soy sauce (1 Tbs.); low sodium condiments (ketchup, chili sauce, mustard) in limited amounts (1 tsp.) fresh ground horseradish; unsalted tortilla chips, pretzels, potato chips, popcorn, salsa (1/4 cup) Any seasoning made with salt including garlic salt, celery salt, onion salt, and seasoned salt; sea salt, rock salt, kosher salt; meat tenderizers; monosodium glutamate; mustard, regular soy sauce, barbecue, sauce, chili sauce, teriyaki sauce, steak sauce, Worcestershire sauce, and most flavored vinegars; canned gravy and mixes; regular condiments; salted snack foods,  olives, picles, relish, horseradish sauce, catsup   Food preparation: Try these seasonings Meats:    Pork Sage, onion Serve with applesauce  Chicken Poultry seasoning, thyme, parsley Serve with cranberry sauce  Lamb Curry powder, rosemary, garlic, thyme Serve with mint sauce or jelly  Veal Marjoram, basil Serve with current jelly, cranberry sauce  Beef Pepper, bay leaf Serve with dry mustard, unsalted chive butter  Fish [place] leaf, dill Serve with unsalted lemon butter, unsalted parsley butter  Vegetables:    Asparagus Lemon juice   Broccoli Lemon juice   Carrots Mustard dressing parsley, mint, nutmeg, glazed with unsalted butter and sugar   Green beans Marjoram, lemon juice, nutmeg,dill seed   Tomatoes Basil, marjoram, onion   Spice /blend for [employer]" 4 tsp ground thyme 1 tsp ground sage 3 tsp ground rosemary 4 tsp ground marjoram   Test your knowledge 7. A product that says "Salt Free" may still contain sodium. True or False 8. Garlic  Powder and Hot Pepper Sauce an be used as alternative seasonings.True or False 9. Processed foods have more sodium than fresh foods.  True or False 10. Canned Vegetables have less sodium than froze True or False  WAYS TO DECREASE YOUR SODIUM INTAKE 5. Avoid the use of added salt in cooking and at the table.  Table salt (and other prepared seasonings which contain salt) is probably one of the greatest sources of sodium in the diet.  Unsalted foods can gain flavor from the sweet, sour, and butter taste sensations of herbs and spices.  Instead of using salt for seasoning, try the following seasonings with the foods listed.  Remember: how you use them to enhance natural food flavors is limited only by your creativity... Allspice-Meat, fish, eggs, fruit, peas, red and yellow vegetables Almond Extract-Fruit baked goods Anise Seed-Sweet breads, fruit, carrots, beets, cottage cheese, cookies (tastes like licorice) Basil-Meat, fish, eggs, vegetables, rice,  vegetables salads, soups, sauces Bay Leaf-Meat, fish, stews, poultry Burnet-Salad, vegetables (cucumber-like flavor) Caraway Seed-Bread, cookies, cottage cheese, meat, vegetables, cheese, rice Cardamon-Baked goods, fruit, soups Celery Powder or seed-Salads, salad dressings, sauces, meatloaf, soup, bread.Do not use  celery salt Chervil-Meats, salads, fish, eggs, vegetables, cottage cheese (parsley-like flavor) Chili Power-Meatloaf, chicken cheese, corn, eggplant, egg dishes Chives-Salads cottage cheese, egg dishes, soups, vegetables, sauces Cilantro-Salsa, casseroles Cinnamon-Baked goods, fruit, pork, lamb, chicken, carrots Cloves-Fruit, baked goods, fish, pot roast, green beans, beets, carrots Coriander-Pastry, cookies, meat, salads, cheese (lemon-orange flavor) Cumin-Meatloaf, fish,cheese, eggs, cabbage,fruit pie (caraway flavor) [employer], fruit, eggs, fish, poultry, cottage cheese, vegetables Dill Seed-Meat, cottage cheese, poultry, vegetables, fish, salads, bread Fennel Seed-Bread, cookies, apples, pork, eggs, fish, beets, cabbage, cheese, Licorice-like flavor Garlic-(buds or powder) Salads, meat, poultry, fish, bread, butter, vegetables, potatoes.Do not  use garlic salt Ginger-Fruit, vegetables, baked goods, meat, fish, poultry Horseradish Root-Meet, vegetables, butter Lemon Juice or Extract-Vegetables, fruit, tea, baked goods, fish salads Mace-Baked goods fruit, vegetables, fish, poultry (taste like nutmeg) Maple Extract-Syrups Marjoram-Meat, chicken, fish, vegetables, breads, green salads (taste like Sage) Mint-Tea, lamb, sherbet, vegetables, desserts, carrots, cabbage Mustard, Dry or Seed-Cheese, eggs, meats, vegetables, poultry Nutmeg-Baked goods, fruit, chicken, eggs, vegetables, desserts Onion Powder-Meat, fish, poultry, vegetables, cheese, eggs, bread, rice salads (Do not use   Onion salt) Orange Extract-Desserts, baked goods Oregano-Pasta, eggs, cheese, onions,  pork, lamb, fish, chicken, vegetables, green salads Paprika-Meat, fish, poultry, eggs, cheese, vegetables Parsley Flakes-Butter, vegetables, meat fish, poultry, eggs, bread, salads (certain forms may   Contain sodium Pepper-Meat fish, poultry, vegetables, eggs Peppermint Extract-Desserts, baked goods Poppy Seed-Eggs, bread, cheese, fruit dressings, baked goods, noodles, vegetables, cottage  [employer], poultry, meat, fish, cauliflower, turnips,eggs bread Saffron-Rice, bread, veal, chicken, fish, eggs Sage-Meat, fish, poultry, onions, eggplant, tomateos, pork, stews Savory-Eggs, salads, poultry, meat, rice, vegetables, soups, pork Tarragon-Meat, poultry, fish, eggs, butter, vegetables (licorice-like flavor)  Thyme-Meat, poultry, fish, eggs, vegetables, (clover-like flavor), sauces, soups Tumeric-Salads, butter, eggs, fish, rice, vegetables (saffron-like flavor) Vanilla Extract-Baked goods, candy Vinegar-Salads, vegetables, meat marinades Walnut Extract-baked goods, candy  2. Choose your Foods Wisely   The following is a list of foods to avoid which are high in sodium:  Meats-Avoid all smoked, canned, salt cured, dried and kosher meat and fish as well as Anchovies   [employer] meats:Bologna, Liverwurst, Pastrami Canned meat or fish  Marinated herring Caviar    Pepperoni Corned Beef   Pizza Dried chipped beef  Salami Frozen breaded fish or meat Salt pork Frankfurters or hot dogs  Sardines  Gefilte fish   Sausage Ham (boiled ham, Proscuitto Smoked butt    spiced ham)   Spam      TV Dinners Vegetables Canned vegetables (Regular) Relish Canned mushrooms  Sauerkraut Olives    Tomato juice Pickles  Bakery and Dessert Products Canned puddings  Cream pies Cheesecake   Decorated cakes Cookies  Beverages/Juices Tomato juice, regular  Gatorade   V-8 vegetable juice, regular  Breads and Cereals Biscuit mixes   Salted potato  chips, corn chips, pretzels Bread stuffing mixes  Salted crackers and rolls Pancake and waffle mixes Self-rising flour  Seasonings Accent    Meat sauces Barbecue sauce  Meat tenderizer Catsup    Monosodium glutamate (MSG) Celery salt   Onion salt Chili sauce   Prepared mustard Garlic salt   Salt, seasoned salt, sea salt Gravy mixes   Soy sauce Horseradish   Steak sauce Ketchup   Tartar sauce Lite salt    Teriyaki sauce Marinade mixes   Worcestershire sauce  Others Baking powder   Cocoa and cocoa mixes Baking soda   Commercial casserole mixes Candy-caramels, chocolate  Dehydrated soups    Bars, fudge,nougats  Instant rice and pasta mixes Canned broth or soup  Maraschino cherries Cheese, aged and processed cheese and cheese spreads  Learning Assessment Quiz  Indicated T (for True) or F (for False) for each of the following statements:  1. _____ Fresh fruits and vegetables and unprocessed grains are generally low in sodium 2. _____ Water may contain a considerable amount of sodium, depending on the source 3. _____ You can always tell if a food is high in sodium by tasting it 4. _____ Certain laxatives my be high in sodium and should be avoided unless prescribed   by a physician or pharmacist 5. _____ Salt substitutes may be used freely by anyone on a sodium restricted diet 6. _____ Sodium is present in table salt, food additives and as a natural component of   most foods 7. _____ Table salt is approximately 90% sodium 8. _____ Limiting sodium intake may help prevent excess fluid accumulation in the body 9. _____ On a sodium-restricted diet, seasonings such as bouillon soy sauce, and    cooking wine should be used in place of table salt 10. _____ On an ingredient list, a product which lists monosodium glutamate as the first   ingredient is an appropriate food to include on a low sodium diet  Circle the best answer(s) to the following statements (Hint: there may be more than one  correct answer)  11. On a low-sodium diet, some acceptable snack items are:    A. Olives  F. Bean dip   K. Grapefruit juice    B. Salted Pretzels G. Commercial Popcorn   L. Canned peaches    C. Carrot Sticks  H. Bouillon   M. Unsalted nuts   D. [place] fries  I. Peanut butter crackers N. Salami   E. Sweet pickles J. Tomato Juice   O. Pizza  12.  Seasonings that may be used freely on a reduced - sodium diet include   A. Lemon wedges F.Monosodium glutamate K. Celery seed    B.Soysauce   G. Pepper   L. Mustard powder   C. Sea salt  H. Cooking wine  M. Onion flakes   D. Vinegar  E. Prepared horseradish N. Salsa   E. Sage   J. Worcestershire sauce  O. Chutney    Your goal for your bp is < 130/80.  Will try  and give a bp cuff on your return visit.

## 2020-06-12 NOTE — Addendum Note (Signed)
Addended by: Michaelle Copas on: 06/12/2020 03:14 PM   Modules accepted: Orders

## 2020-06-26 NOTE — Progress Notes (Addendum)
Cardiology Office Note:    Date:  06/27/2020   ID:  Deborah Williamson, DOB Aug 02, 1960, MRN [id number]  PCP:  Julieanne Manson, MD   [facility]  Cardiologist:  Verne Carrow, MD   Electrophysiologist:  None       Referring MD: Julieanne Manson, MD   Chief Complaint:  Follow-up (HTN)    Patient Profile:     Deborah Williamson is a 60 y.o. female with:   Thoracic aortic aneurysm  ? CT 6/19: 4 cm  ? CT 6/21: 4 cm  ? CT 3/22: 4.1 cm   Hypertension   Tako-tsubo CM ? NSTEMI 11/14 >> Cath 11/14: no CAD ? EF 30-35 (01/2013) ? EF 55-60 (12/2018)  Bipolar d/o   Prior CV studies: Echocardiogram 01/20/2019 EF 55-60, no RWMA, normal RVSF, trivial eff, mild MR, mild TR, ascending aorta 39 mm, RVSP 25.3, GLS -21.1%  Echocardiogram 09/11/16 EF 55-60  Echocardiogram 05/07/13 EF 55  LHC (02/16/2013):  No angiographic CAD, EF 35% with anteroapical, apical and inferoapical HK.  Echocardiogram (02/17/2013):  EF 30-35%, apical septal AK, apical lateral AK, apical inferior AK, apical anterior AK, AK of the true apex, grade 1 diastolic dysfunction, trivial MR, normal RVSF, PASP 50.      History of Present Illness:    Deborah Williamson was last seen 06/09/2020.  She had had a recent knee surgery.  She called in with symptoms of shortness of breath.  Her D-dimer was elevated.  A chest CT demonstrated no evidence of pulmonary embolism.  Her thoracic aortic aneurysm was stable in size.  When I saw her, her blood pressure was significantly elevated.  I suspect her symptoms are likely related to uncontrolled blood pressure.  I placed her on amlodipine.  She returns for follow-up. She is here alone.  She is feeling better. Her breathing is improved.  She has not had chest pain, syncope, orthopnea.  Her HAs have resolved.          Past Medical History:  Diagnosis Date  . Anxiety   . Ascending aortic aneurysm (HCC) 01/2013   3.5-4 cm noted on CT-A  .  Bipolar II disorder, most recent episode major depressive (HCC)    Followed at [facility]  Cline Crock, counselor:  [phone]  . DJD (degenerative joint disease) of knee 01/02/2017  . Dysrhythmia    "irregular" (02/17/2013)  . Family history of breast cancer   . Family history of prostate cancer   . Headache(784.0)    "probably once/week" (02/17/2013)  . Hyperlipidemia   . Hypertension   . Insomnia   . Left ovarian cyst 01/2013   Incidental CT finding  . Migraine    "probably once/week" (02/17/2013)  . Morbid obesity (HCC) 01/02/2017  . Normocytic anemia   . NSTEMI (non-ST elevated myocardial infarction) (HCC) 02/16/2013   Takotsubo cardiomyopathy, normal cors  . Obesity   . OCD (obsessive compulsive disorder)   . Plantar fasciitis of right foot   . PTSD (post-traumatic stress disorder)   . PTSD (post-traumatic stress disorder)   . PTSD (post-traumatic stress disorder)   . Takotsubo cardiomyopathy 01/2013   a. 2D echo 02/17/13: EF 30-35%, periapical AK, normal RV size/function, moderate pulmonary HTN. c/w Takotsubo CM. ;  b.  Echo (04/2013): EF 55%, no WMA, Gr 1 DD, mildly dilated ascending aorta (39 mm), mild MR, mild BAE, PASP 33  . Trichomonas vaginitis 10/2014    Current Medications: Current Meds  Medication Sig  . acetaminophen (TYLENOL)  500 MG tablet Take 1,000 mg by mouth 2 (two) times daily as needed for moderate pain or headache.  Marland Kitchen amLODipine (NORVASC) 5 MG tablet Take 1 tablet (5 mg total) by mouth daily.  . Ascorbic Acid (VITAMIN C ADULT GUMMIES PO) Take by mouth. 2 daily  . aspirin EC 81 MG tablet Take 81 mg by mouth daily.  . busPIRone (BUSPAR) 15 MG tablet 15 mg 3 (three) times daily.   . carvedilol (COREG) 12.5 MG tablet Take 1 tablet (12.5 mg total) by mouth 2 (two) times daily.  . diclofenac Sodium (VOLTAREN) 1 % GEL Apply 2 g topically 4 (four) times daily. Apply to hand twice daily  . FIBER ADULT GUMMIES PO Take by mouth. 2 daily  . hydrochlorothiazide  (MICROZIDE) 12.5 MG capsule Take 1 capsule (12.5 mg total) by mouth daily.  Marland Kitchen lithium carbonate 300 MG capsule 300 mg. 1 daily at bedtime  . loratadine (CLARITIN) 10 MG tablet Take 1 tablet (10 mg total) by mouth daily.  Marland Kitchen losartan (COZAAR) 100 MG tablet Take 1 tablet (100 mg total) by mouth daily.  . Melatonin 5 MG TABS Take 15 mg by mouth at bedtime as needed (sleep).  . meloxicam (MOBIC) 15 MG tablet Take 15 mg by mouth daily as needed for pain.  . Multiple Vitamin (MULTIVITAMIN WITH MINERALS) TABS tablet Take 1 tablet by mouth daily.  . Multiple Vitamins-Minerals (HAIR SKIN AND NAILS FORMULA) TABS Take 1 tablet by mouth daily.  Marland Kitchen OVER THE COUNTER MEDICATION 2 (two) times daily. Apple cider vinegar gummy  . prazosin (MINIPRESS) 2 MG capsule at bedtime.   Marland Kitchen REXULTI 2 MG TABS daily.   . rosuvastatin (CRESTOR) 10 MG tablet Take 1 tablet (10 mg total) by mouth daily.  . sertraline (ZOLOFT) 100 MG tablet Take 100 mg by mouth. 2 tablets daily in the morning  . tiZANidine (ZANAFLEX) 4 MG tablet Take 4 mg by mouth every 8 (eight) hours as needed for muscle spasms.  . traMADol (ULTRAM) 50 MG tablet SMARTSIG:1-2 Tablet(s) By Mouth Every Evening  . traZODone (DESYREL) 100 MG tablet Take 100 mg by mouth. 3 tablets at bedtime     Allergies:   Lisinopril and Vicodin [hydrocodone-acetaminophen]   Social History   Tobacco Use  . Smoking status: Never Smoker  . Smokeless tobacco: Never Used  Vaping Use  . Vaping Use: Never used  Substance Use Topics  . Alcohol use: No  . Drug use: No     Family Hx: The patient's family history includes Breast cancer (age of onset: 19) in her cousin; Breast cancer (age of onset: 83) in her maternal aunt; Breast cancer (age of onset: 90) in her half-sister, maternal aunt, and mother; Breast cancer (age of onset: 32) in her cousin; Depression in her sister; Diabetes in her paternal grandmother; Heart attack in her maternal grandmother; Obesity in her daughter; Other  (age of onset: 78) in her maternal grandfather; Prostate cancer (age of onset: 34) in her maternal uncle and maternal uncle; Stroke in her paternal grandmother.  ROS   EKGs/Labs/Other Test Reviewed:    EKG:  EKG is not ordered today.  The ekg ordered today demonstrates n/a  Recent Labs: 07/16/2019: ALT 10 08/31/2019: BUN 16; Creatinine, Ser 0.77; Potassium 4.1; Sodium 138 06/08/2020: Hemoglobin 11.4; Platelets 312   Recent Lipid Panel Lab Results  Component Value Date/Time   CHOL 136 01/15/2019 03:14 PM   TRIG 60 01/15/2019 03:14 PM   HDL 54 01/15/2019 03:14 PM  CHOLHDL 3 05/07/2013 10:57 AM   LDLCALC 69 01/15/2019 03:14 PM      Risk Assessment/Calculations:      Physical Exam:    VS:  BP 120/90   Pulse 71   Ht 5\' 6"  (1.676 m)   Wt 234 lb 9.6 oz (106.4 kg)   SpO2 99%   BMI 37.87 kg/m     Wt Readings from Last 3 Encounters:  06/27/20 234 lb 9.6 oz (106.4 kg)  06/09/20 234 lb 12.8 oz (106.5 kg)  01/14/20 258 lb (117 kg)     Constitutional:      Appearance: Healthy appearance. Not in distress.  Neck:     Vascular: JVD normal.  Pulmonary:     Effort: Pulmonary effort is normal.     Breath sounds: No wheezing. No rales.  Cardiovascular:     Normal rate. Regular rhythm. Normal S1. Normal S2.     Murmurs: There is no murmur.  Edema:    Peripheral edema absent.  Abdominal:     Palpations: Abdomen is soft.  Skin:    General: Skin is warm and dry.  Neurological:     Mental Status: Alert and oriented to person, place and time.     Cranial Nerves: Cranial nerves are intact.          ASSESSMENT & PLAN:    1. Essential hypertension Blood pressure is much better.  I will start her on HCTZ 12.5 mg once daily to try to get her diastolic to goal.  BMET in 2 weeks. F/u with me in 3 mos.     Dispo:  Return in about 3 months (around 09/26/2020) for Routine Follow Up, w/ 11/27/2020, PA-C, in person.   Medication Adjustments/Labs and Tests Ordered: Current medicines  are reviewed at length with the patient today.  Concerns regarding medicines are outlined above.  Tests Ordered: Orders Placed This Encounter  Procedures  . Basic Metabolic Panel (BMET)   Medication Changes: Meds ordered this encounter  Medications  . hydrochlorothiazide (MICROZIDE) 12.5 MG capsule    Sig: Take 1 capsule (12.5 mg total) by mouth daily.    Dispense:  90 capsule    Refill:  3    Signed, Tereso Newcomer, PA-C  06/27/2020 4:51 PM    [facility] [address] [place], [place], [place]  [place] Phone: [phone]; Fax: [phone]

## 2020-06-27 ENCOUNTER — Ambulatory Visit (INDEPENDENT_AMBULATORY_CARE_PROVIDER_SITE_OTHER): Payer: Medicare HMO | Admitting: Physician Assistant

## 2020-06-27 ENCOUNTER — Other Ambulatory Visit: Payer: Self-pay

## 2020-06-27 ENCOUNTER — Encounter: Payer: Self-pay | Admitting: Physician Assistant

## 2020-06-27 VITALS — BP 120/90 | HR 71 | Ht 66.0 in | Wt 234.6 lb

## 2020-06-27 DIAGNOSIS — I10 Essential (primary) hypertension: Secondary | ICD-10-CM | POA: Diagnosis not present

## 2020-06-27 DIAGNOSIS — R0602 Shortness of breath: Secondary | ICD-10-CM

## 2020-06-27 MED ORDER — HYDROCHLOROTHIAZIDE 12.5 MG PO CAPS
12.5000 mg | ORAL_CAPSULE | Freq: Every day | ORAL | 3 refills | Status: DC
Start: 2020-06-27 — End: 2020-10-11

## 2020-06-27 NOTE — Patient Instructions (Signed)
Medication Instructions:  Your physician has recommended you make the following change in your medication:   1. Start HCTZ one tablet by mouth ( 12.5 mg) daily, sent in # 90 to requested pharmacy.   *If you need a refill on your cardiac medications before your next appointment, please call your pharmacy*   Lab Work: Your physician recommends that you return for lab work on Friday, April 22 between 7:30 -4:30.  If you have labs (blood work) drawn today and your tests are completely normal, you will receive your results only by: Marland Kitchen MyChart Message (if you have MyChart) OR . A paper copy in the mail If you have any lab test that is abnormal or we need to change your treatment, we will call you to review the results.   Testing/Procedures: Keep echo appointment.   Follow-Up: At [facility], you and your health needs are our priority.  As part of our continuing mission to provide you with exceptional heart care, we have created designated Provider Care Teams.  These Care Teams include your primary Cardiologist (physician) and Advanced Practice Providers (APPs -  Physician Assistants and Nurse Practitioners) who all work together to provide you with the care you need, when you need it.  We recommend signing up for the patient portal called "MyChart".  Sign up information is provided on this After Visit Summary.  MyChart is used to connect with patients for Virtual Visits (Telemedicine).  Patients are able to view lab/test results, encounter notes, upcoming appointments, etc.  Non-urgent messages can be sent to your provider as well.   To learn more about what you can do with MyChart, go to [email or web address].    Your next appointment:   3 month(s)  The format for your next appointment:   In Person on Friday, July 15 @ 10:15  Provider:   Tereso Newcomer, PA-C   Other Instructions -None

## 2020-06-28 ENCOUNTER — Telehealth: Payer: Self-pay | Admitting: *Deleted

## 2020-06-28 NOTE — Telephone Encounter (Signed)
S/w pt to let pt know bp cuff is left up front for pt to come pick up.

## 2020-07-05 ENCOUNTER — Ambulatory Visit (HOSPITAL_COMMUNITY): Payer: Medicare HMO | Attending: Cardiology

## 2020-07-05 ENCOUNTER — Other Ambulatory Visit: Payer: Self-pay

## 2020-07-05 DIAGNOSIS — I712 Thoracic aortic aneurysm, without rupture: Secondary | ICD-10-CM

## 2020-07-05 DIAGNOSIS — I10 Essential (primary) hypertension: Secondary | ICD-10-CM

## 2020-07-05 DIAGNOSIS — R0602 Shortness of breath: Secondary | ICD-10-CM

## 2020-07-05 DIAGNOSIS — I5181 Takotsubo syndrome: Secondary | ICD-10-CM

## 2020-07-05 DIAGNOSIS — R079 Chest pain, unspecified: Secondary | ICD-10-CM

## 2020-07-05 DIAGNOSIS — I7121 Aneurysm of the ascending aorta, without rupture: Secondary | ICD-10-CM

## 2020-07-05 LAB — ECHOCARDIOGRAM COMPLETE
Area-P 1/2: 2.66 cm2
S' Lateral: 3.2 cm

## 2020-07-06 ENCOUNTER — Encounter: Payer: Self-pay | Admitting: Physician Assistant

## 2020-07-14 ENCOUNTER — Other Ambulatory Visit: Payer: Self-pay

## 2020-07-14 ENCOUNTER — Ambulatory Visit (INDEPENDENT_AMBULATORY_CARE_PROVIDER_SITE_OTHER): Payer: Medicare HMO | Admitting: Internal Medicine

## 2020-07-14 ENCOUNTER — Encounter: Payer: Self-pay | Admitting: Internal Medicine

## 2020-07-14 ENCOUNTER — Other Ambulatory Visit: Payer: Medicare HMO | Admitting: *Deleted

## 2020-07-14 VITALS — BP 126/82 | HR 62 | Resp 18 | Ht 66.0 in | Wt 232.5 lb

## 2020-07-14 DIAGNOSIS — R0602 Shortness of breath: Secondary | ICD-10-CM

## 2020-07-14 DIAGNOSIS — R0600 Dyspnea, unspecified: Secondary | ICD-10-CM | POA: Diagnosis not present

## 2020-07-14 DIAGNOSIS — I10 Essential (primary) hypertension: Secondary | ICD-10-CM

## 2020-07-14 DIAGNOSIS — Z6837 Body mass index (BMI) 37.0-37.9, adult: Secondary | ICD-10-CM | POA: Diagnosis not present

## 2020-07-14 LAB — BASIC METABOLIC PANEL
BUN/Creatinine Ratio: 25 — ABNORMAL HIGH (ref 9–23)
BUN: 21 mg/dL (ref 6–24)
CO2: 22 mmol/L (ref 20–29)
Calcium: 9.3 mg/dL (ref 8.7–10.2)
Chloride: 100 mmol/L (ref 96–106)
Creatinine, Ser: 0.83 mg/dL (ref 0.57–1.00)
Glucose: 123 mg/dL — ABNORMAL HIGH (ref 65–99)
Potassium: 4.2 mmol/L (ref 3.5–5.2)
Sodium: 137 mmol/L (ref 134–144)
eGFR: 82 mL/min/{1.73_m2} (ref >59)

## 2020-07-14 NOTE — Progress Notes (Signed)
Subjective:    Patient ID: Deborah Williamson, female   DOB: [birth year], 60 y.o.   MRN: [id number]   HPI   1.  Shortness of breath started after right knee replacement on 03/22/20 with Dr. Luiz Blare, Ortho--outpatient surgical center.  Difficulties with PT because of the dyspnea.  Never seemed to improve after 3 months, was found to have high BP with dyspnea and sent to Cardiology. Was seen by Tereso Newcomer, PA-C on 06/09/20.  D dimer elevated and sent for CT of chest, which did not show a PE.   Echo with EF of 60-65% and no clinical evidence of CHF. Dyspnea felt due to uncontrolled hypertension and amlodipine 5 mg added to regimen.  Improved, but HCTZ 12.5 mg also added 06/27/20 due to continued mild elevation.   States her breathing is better.  Also describes dizziness with bending over, but that has also improved since BP improved.  2.  Loss of taste and appetite after her surgery with 24 lb weight loss.  Taste now back and eats "like a rat" in the evening.   Premier protein drinks for breakfast. Eats a salad for lunch. Not much besides carbs for lunch and then craving bad foods in the evening.    3.  Right knee--not bending beyond 90 degrees.  Dr. Luiz Blare has recommended pool activity.  Current Meds  Medication Sig  . acetaminophen (TYLENOL) 500 MG tablet Take 1,000 mg by mouth 2 (two) times daily as needed for moderate pain or headache.  Marland Kitchen amLODipine (NORVASC) 5 MG tablet Take 1 tablet (5 mg total) by mouth daily.  . Ascorbic Acid (VITAMIN C ADULT GUMMIES PO) Take by mouth. 2 daily  . aspirin EC 81 MG tablet Take 81 mg by mouth daily.  . busPIRone (BUSPAR) 15 MG tablet 15 mg 3 (three) times daily.   . carvedilol (COREG) 12.5 MG tablet Take 1 tablet (12.5 mg total) by mouth 2 (two) times daily.  . diclofenac Sodium (VOLTAREN) 1 % GEL Apply 2 g topically 4 (four) times daily. Apply to hand twice daily  . FIBER ADULT GUMMIES PO Take by mouth. 2 daily  . hydrochlorothiazide (MICROZIDE)  12.5 MG capsule Take 1 capsule (12.5 mg total) by mouth daily.  Marland Kitchen lithium carbonate 300 MG capsule 300 mg. 1 daily at bedtime  . loratadine (CLARITIN) 10 MG tablet Take 1 tablet (10 mg total) by mouth daily.  Marland Kitchen losartan (COZAAR) 100 MG tablet Take 1 tablet (100 mg total) by mouth daily.  . Melatonin 5 MG TABS Take 15 mg by mouth at bedtime as needed (sleep).  . meloxicam (MOBIC) 15 MG tablet Take 15 mg by mouth daily as needed for pain.  . Multiple Vitamin (MULTIVITAMIN WITH MINERALS) TABS tablet Take 1 tablet by mouth daily.  . Multiple Vitamins-Minerals (HAIR SKIN AND NAILS FORMULA) TABS Take 1 tablet by mouth daily.  Marland Kitchen OVER THE COUNTER MEDICATION 2 (two) times daily. Apple cider vinegar gummy  . prazosin (MINIPRESS) 2 MG capsule at bedtime.   Marland Kitchen REXULTI 2 MG TABS daily.   . rosuvastatin (CRESTOR) 10 MG tablet Take 1 tablet (10 mg total) by mouth daily.  . sertraline (ZOLOFT) 100 MG tablet Take 100 mg by mouth. 2 tablets daily in the morning  . tiZANidine (ZANAFLEX) 4 MG tablet Take 4 mg by mouth every 8 (eight) hours as needed for muscle spasms.  . traZODone (DESYREL) 100 MG tablet Take 100 mg by mouth. 3 tablets at bedtime   Allergies  Allergen Reactions  . Lisinopril Other (See Comments)    Dry cough  . Vicodin [Hydrocodone-Acetaminophen] Nausea Only     Review of Systems    Objective:   BP 126/82 (BP Location: Left Arm, Patient Position: Sitting, Cuff Size: Large)   Pulse 62   Resp 18   Ht 5\' 6"  (1.676 m)   Wt 232 lb 8 oz (105.5 kg)   LMP  (LMP Unknown)   BMI 37.53 kg/m   Physical Exam  NAD Lungs:  CTA CV:  RRR with normal S1 and S2, No S3, S4 or murmur.  Radial and DP pulses normal and equal LE:  No edema.   Assessment & Plan   1.  Dyspnea:  Improving with better control of BP  2.  Morbid obesity:  Has lost 24 lbs, but back to eating poorly in the evening now that her taste has returned.  Discussed making sure she is eating adequate good fats and proteins with  midday meal to curb hunger in the evening.   Went over how to assess nutrition labels to know where her calories are coming from and to move away from her high carb diet.  Discussed more leafy greens, nuts, avocados, eggs, fish She is planning to get back into the water for physical activity as well. Also discussed her behaviors with eating and improving those as well.  3.  Recent right knee replacement with decreased flexion:  Pool activity.  4.  Hypertension:  As in #1, much improved.  5.  CPE in October:  Felt to be at high risk for breast cancer--will discuss at her CPE the high risk specialist to follow for breast cancer--was to see Dr. November with Oncology, but cancelled last month

## 2020-09-11 ENCOUNTER — Other Ambulatory Visit: Payer: Self-pay | Admitting: Internal Medicine

## 2020-09-11 ENCOUNTER — Other Ambulatory Visit: Payer: Self-pay | Admitting: Cardiovascular Disease

## 2020-09-11 DIAGNOSIS — Z1231 Encounter for screening mammogram for malignant neoplasm of breast: Secondary | ICD-10-CM

## 2020-10-06 ENCOUNTER — Ambulatory Visit: Payer: Medicare HMO | Admitting: Physician Assistant

## 2020-10-10 NOTE — Progress Notes (Signed)
Cardiology Office Note:    Date:  10/11/2020   ID:  ERNEST ORR, DOB [birth year], MRN [id number]  PCP:  Julieanne Manson, MD   [facility] Providers Cardiologist:  Verne Carrow, MD      Referring MD: Julieanne Manson, MD   Chief Complaint:  Follow-up (HTN)    Patient Profile:    Deborah Williamson is a 60 y.o. female with:  Thoracic aortic aneurysm CT 6/19: 4 cm CT 6/21: 4 cm  CT 3/22: 4.1 cm  Hypertension Tako-tsubo CM NSTEMI 11/14 >> Cath 11/14: no CAD EF 30-35 (01/2013) EF 55-60 (12/2018) Bipolar d/o     Prior CV studies: Echocardiogram 07/05/20 EF 60-65, no RWMA, GR 1 DD, GLS -22.1, normal RVSF, RVSP 34.8, trivial MR, mild dilation of ascending aorta (39 mm)  Echocardiogram 01/20/2019 EF 55-60, no RWMA, normal RVSF, trivial eff, mild MR, mild TR, ascending aorta 39 mm, RVSP 25.3, GLS -21.1%   Echocardiogram 09/11/16 EF 55-60   Echocardiogram 05/07/13 EF 55   LHC (02/16/2013): No angiographic CAD, EF 35% with anteroapical, apical and inferoapical HK.   Echocardiogram (02/17/2013): EF 30-35%, apical septal AK, apical lateral AK, apical inferior AK, apical anterior AK, AK of the true apex, grade 1 diastolic dysfunction, trivial MR, normal RVSF, PASP 50.     History of Present Illness: Deborah Williamson was last seen in 4/22.  She returns for f/u.  She is here alone.  Overall, she is feeling well.  She has not had chest pain, shortness of breath, syncope.  She has not had leg edema.  She is tolerating her medications well.        Past Medical History:  Diagnosis Date   Anxiety    Ascending aortic aneurysm (HCC) 01/2013   3.5-4 cm noted on CT-A   Bipolar II disorder, most recent episode major depressive (HCC)    Followed at [facility].  Cline Crock, counselor:  [phone]   DJD (degenerative joint disease) of knee 01/02/2017   Dysrhythmia    "irregular" (02/17/2013)   Family history of breast cancer    Family history of prostate cancer     Headache(784.0)    "probably once/week" (02/17/2013)   Hyperlipidemia    Hypertension    Insomnia    Left ovarian cyst 01/2013   Incidental CT finding   Migraine    "probably once/week" (02/17/2013)   Morbid obesity (HCC) 01/02/2017   Normocytic anemia    NSTEMI (non-ST elevated myocardial infarction) (HCC) 02/16/2013   Takotsubo cardiomyopathy, normal cors / Echocardiogram 4/22: EF 60-65, no RWMA, Gr 1 DD, GLS -22.1%, normal RVSF, RVSP 34.8, trivial MR, ascending aorta 39 mm   Obesity    OCD (obsessive compulsive disorder)    Plantar fasciitis of right foot    PTSD (post-traumatic stress disorder)    PTSD (post-traumatic stress disorder)    PTSD (post-traumatic stress disorder)    Takotsubo cardiomyopathy 01/2013   a. 2D echo 02/17/13: EF 30-35%, periapical AK, normal RV size/function, moderate pulmonary HTN. c/w Takotsubo CM. ;  b.  Echo (04/2013): EF 55%, no WMA, Gr 1 DD, mildly dilated ascending aorta (39 mm), mild MR, mild BAE, PASP 33   Trichomonas vaginitis 10/2014    Current Medications: Current Meds  Medication Sig   acetaminophen (TYLENOL) 500 MG tablet Take 1,000 mg by mouth 2 (two) times daily as needed for moderate pain or headache.   amLODipine (NORVASC) 5 MG tablet Take 5 mg by mouth daily.   Ascorbic Acid (VITAMIN C ADULT  GUMMIES PO) Take by mouth. 2 daily   aspirin EC 81 MG tablet Take 81 mg by mouth daily.   busPIRone (BUSPAR) 15 MG tablet 15 mg 3 (three) times daily.    carvedilol (COREG) 12.5 MG tablet TAKE 1 TABLET TWICE DAILY   diclofenac Sodium (VOLTAREN) 1 % GEL Apply 2 g topically as needed. Apply to hand twice daily   FIBER ADULT GUMMIES PO Take by mouth. 2 daily   hydrochlorothiazide (MICROZIDE) 12.5 MG capsule Take 12.5 mg by mouth daily.   losartan (COZAAR) 100 MG tablet TAKE 1 TABLET EVERY DAY   Multiple Vitamin (MULTIVITAMIN WITH MINERALS) TABS tablet Take 1 tablet by mouth daily.   Multiple Vitamins-Minerals (HAIR SKIN AND NAILS FORMULA) TABS  Take 1 tablet by mouth daily.   OVER THE COUNTER MEDICATION 2 (two) times daily. Apple cider vinegar gummy   rosuvastatin (CRESTOR) 10 MG tablet TAKE 1 TABLET EVERY DAY   tiZANidine (ZANAFLEX) 4 MG tablet Take 4 mg by mouth every 8 (eight) hours as needed for muscle spasms.     Allergies:   Lisinopril and Vicodin [hydrocodone-acetaminophen]   Social History   Tobacco Use   Smoking status: Never   Smokeless tobacco: Never  Vaping Use   Vaping Use: Never used  Substance Use Topics   Alcohol use: No   Drug use: No     Family Hx: The patient's family history includes Breast cancer (age of onset: 17) in her cousin; Breast cancer (age of onset: 73) in her maternal aunt; Breast cancer (age of onset: 66) in her half-sister, maternal aunt, and mother; Breast cancer (age of onset: 18) in her cousin; Depression in her sister; Diabetes in her paternal grandmother; Heart attack in her maternal grandmother; Obesity in her daughter; Other (age of onset: 78) in her maternal grandfather; Prostate cancer (age of onset: 58) in her maternal uncle and maternal uncle; Stroke in her paternal grandmother.  ROS   EKGs/Labs/Other Test Reviewed:    EKG:  EKG is not ordered today.  The ekg ordered today demonstrates N/A  Recent Labs: 06/08/2020: Hemoglobin 11.4; Platelets 312 07/14/2020: BUN 21; Creatinine, Ser 0.83; Potassium 4.2; Sodium 137   Recent Lipid Panel Lab Results  Component Value Date/Time   CHOL 136 01/15/2019 03:14 PM   TRIG 60 01/15/2019 03:14 PM   HDL 54 01/15/2019 03:14 PM   LDLCALC 69 01/15/2019 03:14 PM      Risk Assessment/Calculations:      Physical Exam:    VS:  BP 120/80   Pulse 68   Ht 5\' 6"  (1.676 m)   Wt 228 lb 3.2 oz (103.5 kg)   LMP  (LMP Unknown)   BMI 36.83 kg/m     Wt Readings from Last 3 Encounters:  10/11/20 228 lb 3.2 oz (103.5 kg)  07/14/20 232 lb 8 oz (105.5 kg)  06/27/20 234 lb 9.6 oz (106.4 kg)     Constitutional:      Appearance: Healthy  appearance. Not in distress.  Neck:     Vascular: JVD normal.  Pulmonary:     Effort: Pulmonary effort is normal.     Breath sounds: No wheezing. No rales.  Cardiovascular:     Normal rate. Regular rhythm. Normal S1. Normal S2.      Murmurs: There is no murmur.  Edema:    Peripheral edema absent.  Abdominal:     Palpations: Abdomen is soft.  Skin:    General: Skin is warm and dry.  Neurological:  General: No focal deficit present.     Mental Status: Alert and oriented to person, place and time.        ASSESSMENT & PLAN:    1. Essential hypertension The patient's blood pressure is controlled on her current regimen.  No changes will be made today.  2. Takotsubo cardiomyopathy Recent echocardiogram demonstrated continued normal LV function with an EF of 60-65% and normal GLS.  3. Ascending aortic aneurysm (HCC) 4.1 cm on chest CT in March 2022.  We discussed the importance of good BP control.  Follow-up with Dr. Clifton James in 1 year.    Dispo:  Return in about 1 year (around 10/11/2021) for Routine follow up in 1 year with Dr.McAlhany. .   Medication Adjustments/Labs and Tests Ordered: Current medicines are reviewed at length with the patient today.  Concerns regarding medicines are outlined above.  Tests Ordered: No orders of the defined types were placed in this encounter.  Medication Changes: No orders of the defined types were placed in this encounter.   Signed, Tereso Newcomer, PA-C  10/11/2020 11:48 AM    [facility] [address] [place], [place], [place]  [address] Phone: [phone]; Fax: [phone]

## 2020-10-11 ENCOUNTER — Other Ambulatory Visit: Payer: Self-pay

## 2020-10-11 ENCOUNTER — Ambulatory Visit: Payer: Medicare HMO | Admitting: Physician Assistant

## 2020-10-11 ENCOUNTER — Encounter: Payer: Self-pay | Admitting: Physician Assistant

## 2020-10-11 VITALS — BP 120/80 | HR 68 | Ht 66.0 in | Wt 228.2 lb

## 2020-10-11 DIAGNOSIS — I10 Essential (primary) hypertension: Secondary | ICD-10-CM | POA: Diagnosis not present

## 2020-10-11 DIAGNOSIS — I7121 Aneurysm of the ascending aorta, without rupture: Secondary | ICD-10-CM

## 2020-10-11 DIAGNOSIS — I5181 Takotsubo syndrome: Secondary | ICD-10-CM

## 2020-10-11 DIAGNOSIS — I712 Thoracic aortic aneurysm, without rupture: Secondary | ICD-10-CM

## 2020-10-11 NOTE — Patient Instructions (Signed)
Medication Instructions:   Your physician recommends that you continue on your current medications as directed. Please refer to the Current Medication list given to you today.  *If you need a refill on your cardiac medications before your next appointment, please call your pharmacy*   Lab Work:  -NONE  If you have labs (blood work) drawn today and your tests are completely normal, you will receive your results only by: MyChart Message (if you have MyChart) OR A paper copy in the mail If you have any lab test that is abnormal or we need to change your treatment, we will call you to review the results.   Testing/Procedures:  -NONE   Follow-Up: At [facility], you and your health needs are our priority.  As part of our continuing mission to provide you with exceptional heart care, we have created designated Provider Care Teams.  These Care Teams include your primary Cardiologist (physician) and Advanced Practice Providers (APPs -  Physician Assistants and Nurse Practitioners) who all work together to provide you with the care you need, when you need it.  We recommend signing up for the patient portal called "MyChart".  Sign up information is provided on this After Visit Summary.  MyChart is used to connect with patients for Virtual Visits (Telemedicine).  Patients are able to view lab/test results, encounter notes, upcoming appointments, etc.  Non-urgent messages can be sent to your provider as well.   To learn more about what you can do with MyChart, go to [email or web address].    Your next appointment:   1 year(s)  The format for your next appointment:   In Person  Provider:   Christopher McAlhany, MD     Other Instructions  Your physician wants you to follow-up in: 1 year with Dr. McAlhany.  You will receive a reminder letter in the mail two months in advance. If you don't receive a letter, please call our office to schedule the follow-up appointment.   

## 2020-11-03 ENCOUNTER — Ambulatory Visit
Admission: RE | Admit: 2020-11-03 | Discharge: 2020-11-03 | Disposition: A | Discharge status: Home / Self Care | Payer: Medicare HMO | Source: Ambulatory Visit | Attending: Internal Medicine | Admitting: Internal Medicine

## 2020-11-03 ENCOUNTER — Other Ambulatory Visit: Payer: Self-pay

## 2020-11-03 DIAGNOSIS — Z1231 Encounter for screening mammogram for malignant neoplasm of breast: Secondary | ICD-10-CM

## 2020-11-19 ENCOUNTER — Emergency Department (HOSPITAL_BASED_OUTPATIENT_CLINIC_OR_DEPARTMENT_OTHER)
Admission: EM | Admit: 2020-11-19 | Discharge: 2020-11-19 | Disposition: A | Discharge status: Home / Self Care | Payer: Medicare HMO | Attending: Emergency Medicine | Admitting: Emergency Medicine

## 2020-11-19 ENCOUNTER — Encounter (HOSPITAL_BASED_OUTPATIENT_CLINIC_OR_DEPARTMENT_OTHER): Payer: Self-pay | Admitting: Emergency Medicine

## 2020-11-19 DIAGNOSIS — R202 Paresthesia of skin: Secondary | ICD-10-CM | POA: Diagnosis not present

## 2020-11-19 DIAGNOSIS — R519 Headache, unspecified: Secondary | ICD-10-CM | POA: Diagnosis not present

## 2020-11-19 DIAGNOSIS — Y92410 Unspecified street and highway as the place of occurrence of the external cause: Secondary | ICD-10-CM | POA: Diagnosis not present

## 2020-11-19 DIAGNOSIS — R531 Weakness: Secondary | ICD-10-CM | POA: Diagnosis not present

## 2020-11-19 DIAGNOSIS — M542 Cervicalgia: Secondary | ICD-10-CM | POA: Insufficient documentation

## 2020-11-19 DIAGNOSIS — Z5321 Procedure and treatment not carried out due to patient leaving prior to being seen by health care provider: Secondary | ICD-10-CM | POA: Diagnosis not present

## 2020-11-19 NOTE — ED Notes (Signed)
Pt left states she no longer wants to wait to be seen
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
## 2020-11-20 ENCOUNTER — Telehealth: Payer: Self-pay
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
## 2020-11-20 NOTE — Telephone Encounter (Signed)
Please make her an appt next available with acute

## 2020-11-21 ENCOUNTER — Encounter: Payer: Self-pay | Admitting: Internal Medicine

## 2020-11-21 ENCOUNTER — Other Ambulatory Visit: Payer: Self-pay

## 2020-11-21 ENCOUNTER — Ambulatory Visit
Admission: RE | Admit: 2020-11-21 | Discharge: 2020-11-21 | Disposition: A | Discharge status: Home / Self Care | Payer: Medicare HMO | Source: Ambulatory Visit | Attending: Internal Medicine | Admitting: Internal Medicine

## 2020-11-21 ENCOUNTER — Ambulatory Visit (INDEPENDENT_AMBULATORY_CARE_PROVIDER_SITE_OTHER): Payer: Medicare HMO | Admitting: Internal Medicine

## 2020-11-21 VITALS — BP 110/80 | HR 60 | Resp 16 | Ht 66.0 in | Wt 223.0 lb

## 2020-11-21 DIAGNOSIS — M542 Cervicalgia: Secondary | ICD-10-CM

## 2020-11-21 DIAGNOSIS — G44319 Acute post-traumatic headache, not intractable: Secondary | ICD-10-CM

## 2020-11-21 DIAGNOSIS — R0789 Other chest pain: Secondary | ICD-10-CM

## 2020-11-21 DIAGNOSIS — M545 Low back pain, unspecified: Secondary | ICD-10-CM

## 2020-11-21 MED ORDER — PREDNISONE 10 MG PO TABS: ORAL_TABLET | ORAL | 0 refills | Status: DC

## 2020-11-21 NOTE — Patient Instructions (Signed)
Day 1-3:  4 tabs by mouth daily Day 4:  3 1/2 tabs Day 5:  3 tabs Day 6:  2 1/2 tabs Day 7:  2 tabs Day 8:  1 1/2 tabs Day 9: 1 tab Day 10:  1/2 tab Day 11:  done

## 2020-11-21 NOTE — Telephone Encounter (Signed)
Pt scheduled on 11/21/20 for an acute
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
## 2020-11-30 ENCOUNTER — Telehealth: Payer: Self-pay

## 2020-11-30 NOTE — Telephone Encounter (Signed)
Pt called to report that she continues to have the same pains from her visit last week 11/21/20. Muscle spasms on her back, numbness in neck, headaches, and ringing. Prednisone has not helped with symptoms.  Recommended to visit urgent care if issues continues or worsen before being able to make her an appt

## 2020-12-05 NOTE — Telephone Encounter (Signed)
See if willing to go to physical therapy.

## 2020-12-06 NOTE — Telephone Encounter (Signed)
Pt did not answer. Voicemail left to call back 

## 2020-12-06 NOTE — Telephone Encounter (Signed)
Patient is willing to go to PT. Want to know if it is normal that headache and ringing continue

## 2020-12-07 ENCOUNTER — Other Ambulatory Visit: Payer: Self-pay | Admitting: Internal Medicine

## 2020-12-07 DIAGNOSIS — H9313 Tinnitus, bilateral: Secondary | ICD-10-CM

## 2020-12-07 DIAGNOSIS — M545 Low back pain, unspecified: Secondary | ICD-10-CM

## 2020-12-07 DIAGNOSIS — G44319 Acute post-traumatic headache, not intractable: Secondary | ICD-10-CM

## 2020-12-07 DIAGNOSIS — M542 Cervicalgia: Secondary | ICD-10-CM

## 2020-12-07 DIAGNOSIS — M25562 Pain in left knee: Secondary | ICD-10-CM

## 2020-12-07 NOTE — Telephone Encounter (Signed)
It can be

## 2020-12-12 ENCOUNTER — Other Ambulatory Visit: Payer: Self-pay | Admitting: *Deleted

## 2020-12-12 MED ORDER — AMLODIPINE BESYLATE 5 MG PO TABS: 5.0000 mg | ORAL_TABLET | Freq: Every day | ORAL | 3 refills | Status: DC

## 2020-12-12 MED ORDER — HYDROCHLOROTHIAZIDE 12.5 MG PO CAPS: 12.5000 mg | ORAL_CAPSULE | Freq: Every day | ORAL | 3 refills | Status: DC

## 2020-12-16 ENCOUNTER — Encounter (HOSPITAL_COMMUNITY): Payer: Self-pay | Admitting: Physician Assistant

## 2020-12-16 ENCOUNTER — Ambulatory Visit (HOSPITAL_COMMUNITY)
Admission: EM | Admit: 2020-12-16 | Discharge: 2020-12-16 | Disposition: A | Discharge status: Home / Self Care | Payer: Medicare HMO | Attending: Family Medicine | Admitting: Family Medicine

## 2020-12-16 ENCOUNTER — Other Ambulatory Visit: Payer: Self-pay

## 2020-12-16 ENCOUNTER — Ambulatory Visit (INDEPENDENT_AMBULATORY_CARE_PROVIDER_SITE_OTHER): Payer: Medicare HMO

## 2020-12-16 DIAGNOSIS — W19XXXA Unspecified fall, initial encounter: Secondary | ICD-10-CM | POA: Diagnosis not present

## 2020-12-16 DIAGNOSIS — S6991XA Unspecified injury of right wrist, hand and finger(s), initial encounter: Secondary | ICD-10-CM | POA: Diagnosis not present

## 2020-12-16 DIAGNOSIS — M79644 Pain in right finger(s): Secondary | ICD-10-CM
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
## 2020-12-16 NOTE — Discharge Instructions (Addendum)
Your x-ray was normal with no evidence of a fracture.  I believe that the symptoms you are having are from the swelling.  We are going to put you in a finger brace and I would like you to use elevation and ice for additional symptom relief.  Because of your cardiac history cannot take NSAIDs but please take Tylenol as needed for pain.  If you have any worsening symptoms you need to be reevaluated.  If your symptoms do not significantly improve over the next 24 to 48 hours I would recommend following up with orthopedic/hand specialist.  It is okay to return to the one who you have seen previously; call them to schedule an appointment if necessary.

## 2020-12-19 ENCOUNTER — Other Ambulatory Visit: Payer: Self-pay

## 2020-12-19 ENCOUNTER — Ambulatory Visit: Payer: Medicare HMO | Attending: Internal Medicine | Admitting: Physical Therapy

## 2020-12-19 DIAGNOSIS — M542 Cervicalgia: Secondary | ICD-10-CM | POA: Insufficient documentation

## 2020-12-19 DIAGNOSIS — R2689 Other abnormalities of gait and mobility: Secondary | ICD-10-CM | POA: Diagnosis present

## 2020-12-19 DIAGNOSIS — M6281 Muscle weakness (generalized): Secondary | ICD-10-CM | POA: Insufficient documentation

## 2020-12-19 DIAGNOSIS — M545 Low back pain, unspecified: Secondary | ICD-10-CM | POA: Diagnosis present

## 2020-12-19 NOTE — Patient Instructions (Signed)
Access Code: [email or web address] URL: [email or web address] Date: 12/19/2020 Prepared by: Alphonzo Severance  Exercises Seated Cervical Rotation AROM - 1 x daily - 7 x weekly - 3 sets - 10 reps Seated Cervical Sidebending AROM - 1 x daily - 7 x weekly - 3 sets - 10 reps Seated Cervical Flexion AROM - 1 x daily - 7 x weekly - 3 sets - 10 reps Seated Cervical Extension AROM - 1 x daily - 7 x weekly - 3 sets - 10 reps Seated Flexion Stretch - 1 x daily - 7 x weekly - 3 sets - 10 reps
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
## 2020-12-26 ENCOUNTER — Encounter: Payer: Self-pay | Admitting: Physical Therapy

## 2020-12-26 ENCOUNTER — Ambulatory Visit: Payer: Medicare HMO | Attending: Internal Medicine | Admitting: Physical Therapy

## 2020-12-26 ENCOUNTER — Other Ambulatory Visit: Payer: Self-pay

## 2020-12-26 DIAGNOSIS — M545 Low back pain, unspecified: Secondary | ICD-10-CM | POA: Insufficient documentation

## 2020-12-26 DIAGNOSIS — R2689 Other abnormalities of gait and mobility: Secondary | ICD-10-CM | POA: Diagnosis present

## 2020-12-26 DIAGNOSIS — M6281 Muscle weakness (generalized): Secondary | ICD-10-CM | POA: Diagnosis present

## 2020-12-26 DIAGNOSIS — M542 Cervicalgia: Secondary | ICD-10-CM | POA: Insufficient documentation

## 2020-12-26 NOTE — Therapy (Signed)
[facility] [address] [place], [place], [address] Phone: [phone]   Fax:  [phone]  Physical Therapy Treatment  Patient Details  Name: Deborah Williamson MRN: [id number] Date of Birth: [birth year] Referring Provider (PT): Julieanne Manson, MD   Encounter Date: 12/26/2020   PT End of Session - 12/26/20 0913     Visit Number 2    Number of Visits 16    Date for PT Re-Evaluation 02/13/21    Authorization Type [employer]    PT Start Time 0915    PT Stop Time 1000    PT Time Calculation (min) 45 min    Activity Tolerance Patient tolerated treatment well    Behavior During Therapy [facility] for tasks assessed/performed             Past Medical History:  Diagnosis Date   Anxiety    Ascending aortic aneurysm 01/2013   3.5-4 cm noted on CT-A   Bipolar II disorder, most recent episode major depressive (HCC)    Followed at [facility].  Cline Crock, counselor:  [phone]   DJD (degenerative joint disease) of knee 01/02/2017   Dysrhythmia    "irregular" (02/17/2013)   Family history of breast cancer    Family history of prostate cancer    Headache(784.0)    "probably once/week" (02/17/2013)   Hyperlipidemia    Hypertension    Insomnia    Left ovarian cyst 01/2013   Incidental CT finding   Migraine    "probably once/week" (02/17/2013)   Morbid obesity (HCC) 01/02/2017   Normocytic anemia    NSTEMI (non-ST elevated myocardial infarction) (HCC) 02/16/2013   Takotsubo cardiomyopathy, normal cors / Echocardiogram 4/22: EF 60-65, no RWMA, Gr 1 DD, GLS -22.1%, normal RVSF, RVSP 34.8, trivial MR, ascending aorta 39 mm   Obesity    OCD (obsessive compulsive disorder)    Plantar fasciitis of right foot    PTSD (post-traumatic stress disorder)    PTSD (post-traumatic stress disorder)    PTSD (post-traumatic stress disorder)    Takotsubo cardiomyopathy 01/2013   a. 2D echo 02/17/13: EF 30-35%, periapical AK, normal RV  size/function, moderate pulmonary HTN. c/w Takotsubo CM. ;  b.  Echo (04/2013): EF 55%, no WMA, Gr 1 DD, mildly dilated ascending aorta (39 mm), mild MR, mild BAE, PASP 33   Trichomonas vaginitis 10/2014    Past Surgical History:  Procedure Laterality Date   BRACHIOPLASTY Bilateral 05/2011   Plastic Surgery to remove loose skin after Weight Loss   CARDIAC CATHETERIZATION  02/17/2013   no angiographic evidence of CAD, EF 35%, HK or the anteroapical wall, apex and inferoapical wall   FOOT SURGERY Right    KNEE ARTHROSCOPY Right 02/05/2017   [facility]   LEFT HEART CATHETERIZATION WITH CORONARY ANGIOGRAM N/A 02/17/2013   Procedure: LEFT HEART CATHETERIZATION WITH CORONARY ANGIOGRAM;  Surgeon: Kathleene Hazel, MD;  Location: [facility] CATH LAB;  Service: Cardiovascular;  Laterality: N/A;   MASS EXCISION Right 02/29/2016   Procedure: EXCISION MASS, right wrist;  Surgeon: Betha Loa, MD;  Location: [facility];  Service: Orthopedics;  Laterality: Right;  EXCISION MASS, right wrist   TOTAL KNEE ARTHROPLASTY Left 03/10/2017   Procedure: TOTAL KNEE ARTHROPLASTY;  Surgeon: Jodi Geralds, MD;  Location: [facility] OR;  Service: Orthopedics;  Laterality: Left;   TUBAL LIGATION  1989    There were no vitals filed for this visit.   Subjective Assessment - 12/26/20 0919     Subjective Pt reports  that she has been performing HEP.  She feels less hesitant about moving her neck since staring the exercises.  Pain intensity (L shoulder):  current 7/10 Aggs: hurts at rest, shoulder motions.  Eases: heat.  Pain intensity (LBP):  highest 10/10, current 7/10, best 7/10.  Aggs: movement, but pain at rest.  Eases: heat, rest.    Pertinent History Significant PMH: bipolar, hx of aortic aneurism, anemia, hx of MI (2014)               PT Education - 12/26/20 0933     Education Details HEP, FOTO    Person(s) Educated Patient    Methods Handout    Comprehension Verbalized understanding             [facility] Adult PT Treatment/Exercise:  Therapeutic Exercise:  - nu-step L5 43m while taking subjective and planning session with patient - cervical isometrics - frontal and sagittal plane, 5''x5 ea - Pball roll out - L/R/C - pball roll up wall - 2x10 within comfortable range - PPT - 1' training - LTR - 20x - Row - 2x10 GTB - shoulder Ext - 2x10 RTB (DNF endurance?)  Manual Therapy: - STM sub occipital release cervical paraspinals, sub occipitals  Therapeutic Activity - collecting information for FOTO and reviewing with patient     PT Short Term Goals - 12/19/20 1057       PT SHORT TERM GOAL #1   Title Deborah Williamson will be >75% HEP compliant to improve carryover between sessions and facilitate independent management of condition    Target Date 01/09/21               PT Long Term Goals - 12/26/20 0959       PT LONG TERM GOAL #1   Title Deborah Williamson will improve R cervical rotation to >/= 50 degrees  EVAL:  25 degrees with pain  target date: 02/13/21      PT LONG TERM GOAL #2   Title Deborah Williamson will improve 30'' STS (MCID 2) to >/= 7x to show improved LE strength and improved transfers  EVAL: 5x   target date: 02/13/21      PT LONG TERM GOAL #3   Title Deborah Williamson will demonstrate >110 degrees of active ROM in flexion to allow completion of activities involving reaching OH, not limited by pain  EVAL: 65 degrees  target date: 02/13/21      PT LONG TERM GOAL #4   Title Deborah Williamson will improve FOTO score from 53 (on evaluation) to 67 as a proxy for functional improvement  target date: 02/13/21      PT LONG TERM GOAL #5   Title Deborah Williamson will report >/= 25% decrease in pain from evaluation  EVAL: 10/10 max pain  target date: 02/13/21                   Plan - 12/26/20 0941     Clinical Impression Statement Pt reports mild pain reduction following therapy  HEP was updated and reissued to patient;  pt educated on HEP, was provided handout, and verbally confirmed understanding of exercises.    Overall, Deborah Williamson is progressing well with therapy.  Today we concentrated on cervicothoracic strengthening, shoulder range of motion, cervical mobility , and pain reduction.  Pt responds well to therapy.  Several cervical/shoulder exercises are descried as "good hurt" with decreased pain afterwards.  HEP updated to include cervical isometrics.  Pt will continue to benefit from skilled physical therapy to address remaining deficits and achieve listed goals.  Continue per POC.    Stability/Clinical Decision Making Stable/Uncomplicated    Rehab Potential Fair    PT Frequency 2x / week    PT Duration 8 weeks    PT Treatment/Interventions ADLs/Self Care Home Management;Aquatic Therapy;Iontophoresis 4mg /ml Dexamethasone;Gait training;Therapeutic activities;Therapeutic exercise;Neuromuscular re-education;Manual techniques;Dry needling;Vasopneumatic Device;Spinal Manipulations;Joint Manipulations    PT Next Visit Plan Take FOTO and establish goal, Manual PRN, ROM for neck, pain education, information on PTSD group?, gentle strengthening, balance    PT Home Exercise Plan [email or web address]    Consulted and Agree with Plan of Care Patient             Patient will benefit from skilled therapeutic intervention in order to improve the following deficits and impairments:  Decreased balance, Difficulty walking, Impaired sensation, Decreased range of motion, Decreased activity tolerance, Decreased strength, Pain  Visit Diagnosis: Cervicalgia  Low back pain, unspecified back pain laterality, unspecified chronicity, unspecified whether sciatica present  Balance problem  Muscle weakness  Other abnormalities of gait and mobility     Problem List Patient Active Problem List   Diagnosis Date Noted   Genetic testing 02/29/2020   Family history of breast cancer    Family history of prostate cancer     Pruritus 07/16/2019   Night sweats 07/16/2019   PTSD (post-traumatic stress disorder)    Plantar fasciitis of right foot    OCD (obsessive compulsive disorder)    Obesity    Migraine    Insomnia    Dysrhythmia    Bipolar II disorder, most recent episode major depressive (HCC)    Anxiety    Primary osteoarthritis of left knee 03/10/2017   Morbid obesity (HCC) 01/02/2017   DJD (degenerative joint disease) of knee 01/02/2017   Severe recurrent major depression without psychotic features (HCC) 12/19/2015    Class: Chronic   Hyperlipidemia 10/12/2015   Varicose vein    Trichomonas vaginitis 10/24/2014   Left ovarian cyst 02/18/2013   Ascending aortic aneurysm (HCC) 02/18/2013   Takotsubo cardiomyopathy 02/18/2013   NSTEMI (non-ST elevated myocardial infarction) (HCC) 02/16/2013   Normocytic anemia 02/16/2013   Depression 02/25/2012   Intractable headache 12/21/2011   Primary hypertension 12/21/2011    12/23/2011, PT 12/26/2020, 9:59 AM  [facility] [address] [place], [place], [place] Phone: [phone]   Fax:  [phone]  Name: Deborah Williamson MRN: Deborah Williamson Date of Birth: Nov 02, 1960

## 2020-12-26 NOTE — Patient Instructions (Signed)
Access Code: [email or web address] URL: [email or web address] Date: 12/26/2020 Prepared by: Alphonzo Severance  Exercises Seated Cervical Rotation AROM - 1 x daily - 7 x weekly - 3 sets - 10 reps Seated Cervical Sidebending AROM - 1 x daily - 7 x weekly - 3 sets - 10 reps Seated Cervical Flexion AROM - 1 x daily - 7 x weekly - 3 sets - 10 reps Seated Cervical Extension AROM - 1 x daily - 7 x weekly - 3 sets - 10 reps Seated Flexion Stretch - 1 x daily - 7 x weekly - 3 sets - 10 reps Standing Isometric Cervical Sidebending with Manual Resistance - 1 x daily - 7 x weekly - 1 sets - 10 reps - 5'' hold Standing Isometric Cervical Flexion with Manual Resistance - 1 x daily - 7 x weekly - 1 sets - 10 reps - 5'' hold Standing Isometric Cervical Extension with Manual Resistance - 1 x daily - 7 x weekly - 1 sets - 10 reps - 5'' hold

## 2020-12-28 ENCOUNTER — Encounter: Payer: Self-pay | Admitting: Physical Therapy

## 2020-12-28 ENCOUNTER — Other Ambulatory Visit: Payer: Self-pay

## 2020-12-28 ENCOUNTER — Ambulatory Visit: Payer: Medicare HMO | Admitting: Physical Therapy

## 2020-12-28 DIAGNOSIS — M545 Low back pain, unspecified: Secondary | ICD-10-CM

## 2020-12-28 DIAGNOSIS — M542 Cervicalgia: Secondary | ICD-10-CM

## 2020-12-28 DIAGNOSIS — M6281 Muscle weakness (generalized): Secondary | ICD-10-CM

## 2020-12-28 DIAGNOSIS — R2689 Other abnormalities of gait and mobility: Secondary | ICD-10-CM

## 2020-12-28 NOTE — Therapy (Signed)
[facility] [address] [place], [place], [address] Phone: [phone]   Fax:  [phone]  Physical Therapy Treatment  Patient Details  Name: Deborah Williamson MRN: [id number] Date of Birth: 08-Jul-1960 Referring Provider (PT): Julieanne Manson, MD   Encounter Date: 12/28/2020   PT End of Session - 12/28/20 0915     Visit Number 3    Number of Visits 16    Date for PT Re-Evaluation 02/13/21    Authorization Type [employer]    PT Start Time 0915    PT Stop Time 1000    PT Time Calculation (min) 45 min    Activity Tolerance Patient tolerated treatment well    Behavior During Therapy [facility] for tasks assessed/performed             Past Medical History:  Diagnosis Date   Anxiety    Ascending aortic aneurysm 01/2013   3.5-4 cm noted on CT-A   Bipolar II disorder, most recent episode major depressive (HCC)    Followed at [facility]  Cline Crock, counselor:  [phone]   DJD (degenerative joint disease) of knee 01/02/2017   Dysrhythmia    "irregular" (02/17/2013)   Family history of breast cancer    Family history of prostate cancer    Headache(784.0)    "probably once/week" (02/17/2013)   Hyperlipidemia    Hypertension    Insomnia    Left ovarian cyst 01/2013   Incidental CT finding   Migraine    "probably once/week" (02/17/2013)   Morbid obesity (HCC) 01/02/2017   Normocytic anemia    NSTEMI (non-ST elevated myocardial infarction) (HCC) 02/16/2013   Takotsubo cardiomyopathy, normal cors / Echocardiogram 4/22: EF 60-65, no RWMA, Gr 1 DD, GLS -22.1%, normal RVSF, RVSP 34.8, trivial MR, ascending aorta 39 mm   Obesity    OCD (obsessive compulsive disorder)    Plantar fasciitis of right foot    PTSD (post-traumatic stress disorder)    PTSD (post-traumatic stress disorder)    PTSD (post-traumatic stress disorder)    Takotsubo cardiomyopathy 01/2013   a. 2D echo 02/17/13: EF 30-35%, periapical AK, normal RV  size/function, moderate pulmonary HTN. c/w Takotsubo CM. ;  b.  Echo (04/2013): EF 55%, no WMA, Gr 1 DD, mildly dilated ascending aorta (39 mm), mild MR, mild BAE, PASP 33   Trichomonas vaginitis 10/2014    Past Surgical History:  Procedure Laterality Date   BRACHIOPLASTY Bilateral 05/2011   Plastic Surgery to remove loose skin after Weight Loss   CARDIAC CATHETERIZATION  02/17/2013   no angiographic evidence of CAD, EF 35%, HK or the anteroapical wall, apex and inferoapical wall   FOOT SURGERY Right    KNEE ARTHROSCOPY Right 02/05/2017   [facility]   LEFT HEART CATHETERIZATION WITH CORONARY ANGIOGRAM N/A 02/17/2013   Procedure: LEFT HEART CATHETERIZATION WITH CORONARY ANGIOGRAM;  Surgeon: Kathleene Hazel, MD;  Location: [facility] CATH LAB;  Service: Cardiovascular;  Laterality: N/A;   MASS EXCISION Right 02/29/2016   Procedure: EXCISION MASS, right wrist;  Surgeon: Betha Loa, MD;  Location:  SURGERY CENTER;  Service: Orthopedics;  Laterality: Right;  EXCISION MASS, right wrist   TOTAL KNEE ARTHROPLASTY Left 03/10/2017   Procedure: TOTAL KNEE ARTHROPLASTY;  Surgeon: Jodi Geralds, MD;  Location: [facility] OR;  Service: Orthopedics;  Laterality: Left;   TUBAL LIGATION  1989    There were no vitals filed for this visit.   Subjective Assessment - 12/28/20 0920     Subjective Pt reports  that her neck and back are still stiff, but are improving.  Pain intensity (L shoulder):  current 7/10 Aggs: hurts at rest, shoulder motions.  Eases: heat.  Pain intensity (LBP):  highest current 7/10  Aggs: movement, but pain at rest.  Eases: heat, rest.    Pertinent History Significant PMH: bipolar, hx of aortic aneurism, anemia, hx of MI (2014)                [facility] PT Assessment - 12/28/20 0001       AROM   Overall AROM Comments L shoulder flexion: 105 degrees             [facility] Adult PT Treatment/Exercise:   Therapeutic Exercise:   - nu-step L5 58m while taking subjective and  planning session with patient - cervical isometrics - frontal and sagittal plane, 5''x5 ea - Pball roll out - L/R/C - Corner stretch - 30'' x3 - UE ranger - flexion - 2x10 - 28'' - Pball circles below shoulder level on table CC 2x20 ea - PPT - 1' training (NT) - LTR - 20x (NT) - Row - 3x10 GTB (NT) - shoulder Ext - 2x10 RTB (DNF endurance?)   Manual Therapy: - STM sub occipital release, cervical paraspinals, sub occipitals - Gentle GH distraction      PT Short Term Goals - 12/19/20 1057       PT SHORT TERM GOAL #1   Title Deborah Williamson will be >75% HEP compliant to improve carryover between sessions and facilitate independent management of condition    Target Date 01/09/21               PT Long Term Goals - 12/26/20 0959       PT LONG TERM GOAL #1   Title Deborah Williamson will improve R cervical rotation to >/= 50 degrees  EVAL:  25 degrees with pain  target date: 02/13/21      PT LONG TERM GOAL #2   Title Deborah Williamson will improve 30'' STS (MCID 2) to >/= 7x to show improved LE strength and improved transfers  EVAL: 5x   target date: 02/13/21      PT LONG TERM GOAL #3   Title Deborah Williamson will demonstrate >110 degrees of active ROM in flexion to allow completion of activities involving reaching OH, not limited by pain  EVAL: 65 degrees  target date: 02/13/21      PT LONG TERM GOAL #4   Title Deborah Williamson will improve FOTO score from 53 (on evaluation) to 67 as a proxy for functional improvement  target date: 02/13/21      PT LONG TERM GOAL #5   Title Deborah Williamson will report >/= 25% decrease in pain from evaluation  EVAL: 10/10 max pain  target date: 02/13/21                   Plan - 12/28/20 0944     Clinical Impression Statement Pt reports mild pain reduction following therapy  HEP was reviewed, but left unchanged    Overall, Deborah Williamson is progressing fair with therapy.  Today we concentrated on  cervicothoracic strengthening, shoulder range of motion, and cervical mobility .  Pt shows improvement in shoulder flexion today.  She has minimal pain reduction, but is improving functionally.  Pt will continue to benefit from skilled physical therapy to address remaining deficits and achieve listed goals.  Continue per POC.  Stability/Clinical Decision Making Stable/Uncomplicated    Rehab Potential Fair    PT Frequency 2x / week    PT Duration 8 weeks    PT Treatment/Interventions ADLs/Self Care Home Management;Aquatic Therapy;Iontophoresis 4mg /ml Dexamethasone;Gait training;Therapeutic activities;Therapeutic exercise;Neuromuscular re-education;Manual techniques;Dry needling;Vasopneumatic Device;Spinal Manipulations;Joint Manipulations    PT Next Visit Plan Take FOTO and establish goal, Manual PRN, ROM for neck, pain education, information on PTSD group?, gentle strengthening, balance    PT Home Exercise Plan [email or web address]    Consulted and Agree with Plan of Care Patient             Patient will benefit from skilled therapeutic intervention in order to improve the following deficits and impairments:  Decreased balance, Difficulty walking, Impaired sensation, Decreased range of motion, Decreased activity tolerance, Decreased strength, Pain  Visit Diagnosis: Cervicalgia  Low back pain, unspecified back pain laterality, unspecified chronicity, unspecified whether sciatica present  Balance problem  Muscle weakness     Problem List Patient Active Problem List   Diagnosis Date Noted   Genetic testing 02/29/2020   Family history of breast cancer    Family history of prostate cancer    Pruritus 07/16/2019   Night sweats 07/16/2019   PTSD (post-traumatic stress disorder)    Plantar fasciitis of right foot    OCD (obsessive compulsive disorder)    Obesity    Migraine    Insomnia    Dysrhythmia    Bipolar II disorder, most recent episode major depressive (HCC)    Anxiety    Primary  osteoarthritis of left knee 03/10/2017   Morbid obesity (HCC) 01/02/2017   DJD (degenerative joint disease) of knee 01/02/2017   Severe recurrent major depression without psychotic features (HCC) 12/19/2015    Class: Chronic   Hyperlipidemia 10/12/2015   Varicose vein    Trichomonas vaginitis 10/24/2014   Left ovarian cyst 02/18/2013   Ascending aortic aneurysm (HCC) 02/18/2013   Takotsubo cardiomyopathy 02/18/2013   NSTEMI (non-ST elevated myocardial infarction) (HCC) 02/16/2013   Normocytic anemia 02/16/2013   Depression 02/25/2012   Intractable headache 12/21/2011   Primary hypertension 12/21/2011    12/23/2011, PT 12/28/2020, 10:00 AM  [facility] [address] [place], [place], [place] Phone: [phone]   Fax:  [phone]  Name: Deborah Williamson MRN: Deborah Williamson Date of Birth: [birth year]

## 2021-01-02 ENCOUNTER — Other Ambulatory Visit: Payer: Self-pay

## 2021-01-02 ENCOUNTER — Ambulatory Visit: Payer: Medicare HMO | Admitting: Physical Therapy

## 2021-01-02 ENCOUNTER — Encounter: Payer: Self-pay | Admitting: Physical Therapy

## 2021-01-02 DIAGNOSIS — M545 Low back pain, unspecified: Secondary | ICD-10-CM

## 2021-01-02 DIAGNOSIS — M542 Cervicalgia: Secondary | ICD-10-CM

## 2021-01-02 DIAGNOSIS — R2689 Other abnormalities of gait and mobility: Secondary | ICD-10-CM

## 2021-01-02 DIAGNOSIS — M6281 Muscle weakness (generalized): Secondary | ICD-10-CM

## 2021-01-02 NOTE — Therapy (Signed)
[facility] [address] [place], [place], [address] Phone: [phone]   Fax:  [phone]  Physical Therapy Treatment  Patient Details  Name: Deborah Williamson MRN: [id number] Date of Birth: March 21, 1961 Referring Provider (PT): Julieanne Manson, MD   Encounter Date: 01/02/2021   PT End of Session - 01/02/21 0926     Visit Number 4    Number of Visits 16    Date for PT Re-Evaluation 02/13/21    Authorization Type [employer] MCR    Progress Note Due on Visit 10    PT Start Time 0926   pt arrived late   PT Stop Time 0955    PT Time Calculation (min) 29 min    Activity Tolerance Patient tolerated treatment well    Behavior During Therapy [facility] for tasks assessed/performed             Past Medical History:  Diagnosis Date   Anxiety    Ascending aortic aneurysm 01/2013   3.5-4 cm noted on CT-A   Bipolar II disorder, most recent episode major depressive (HCC)    Followed at [facility]  Cline Crock, counselor:  [phone]   DJD (degenerative joint disease) of knee 01/02/2017   Dysrhythmia    "irregular" (02/17/2013)   Family history of breast cancer    Family history of prostate cancer    Headache(784.0)    "probably once/week" (02/17/2013)   Hyperlipidemia    Hypertension    Insomnia    Left ovarian cyst 01/2013   Incidental CT finding   Migraine    "probably once/week" (02/17/2013)   Morbid obesity (HCC) 01/02/2017   Normocytic anemia    NSTEMI (non-ST elevated myocardial infarction) (HCC) 02/16/2013   Takotsubo cardiomyopathy, normal cors / Echocardiogram 4/22: EF 60-65, no RWMA, Gr 1 DD, GLS -22.1%, normal RVSF, RVSP 34.8, trivial MR, ascending aorta 39 mm   Obesity    OCD (obsessive compulsive disorder)    Plantar fasciitis of right foot    PTSD (post-traumatic stress disorder)    PTSD (post-traumatic stress disorder)    PTSD (post-traumatic stress disorder)    Takotsubo cardiomyopathy 01/2013   a. 2D  echo 02/17/13: EF 30-35%, periapical AK, normal RV size/function, moderate pulmonary HTN. c/w Takotsubo CM. ;  b.  Echo (04/2013): EF 55%, no WMA, Gr 1 DD, mildly dilated ascending aorta (39 mm), mild MR, mild BAE, PASP 33   Trichomonas vaginitis 10/2014    Past Surgical History:  Procedure Laterality Date   BRACHIOPLASTY Bilateral 05/2011   Plastic Surgery to remove loose skin after Weight Loss   CARDIAC CATHETERIZATION  02/17/2013   no angiographic evidence of CAD, EF 35%, HK or the anteroapical wall, apex and inferoapical wall   FOOT SURGERY Right    KNEE ARTHROSCOPY Right 02/05/2017   [facility]   LEFT HEART CATHETERIZATION WITH CORONARY ANGIOGRAM N/A 02/17/2013   Procedure: LEFT HEART CATHETERIZATION WITH CORONARY ANGIOGRAM;  Surgeon: Kathleene Hazel, MD;  Location: [facility] CATH LAB;  Service: Cardiovascular;  Laterality: N/A;   MASS EXCISION Right 02/29/2016   Procedure: EXCISION MASS, right wrist;  Surgeon: Betha Loa, MD;  Location: [facility];  Service: Orthopedics;  Laterality: Right;  EXCISION MASS, right wrist   TOTAL KNEE ARTHROPLASTY Left 03/10/2017   Procedure: TOTAL KNEE ARTHROPLASTY;  Surgeon: Jodi Geralds, MD;  Location: [facility] OR;  Service: Orthopedics;  Laterality: Left;   TUBAL LIGATION  1989    There were no vitals filed for this visit.  Subjective Assessment - 01/02/21 0929     Subjective Pt reports that her shoulder and back are hurting this morning, possibly d/t the cold weather.  Pain intensity (L shoulder):  current 8.5/10 Aggs: hurts at rest, shoulder motions.  Eases: heat.  Pain intensity (LBP):   current 7/10  Aggs: movement, but pain at rest.  Eases: heat, rest.    Pertinent History Significant PMH: bipolar, hx of aortic aneurism, anemia, hx of MI (2014)                                        PT Education - 01/02/21 4696     Education Details HEP    Person(s) Educated Patient    Methods Handout     Comprehension Verbalized understanding           [facility] Adult PT Treatment/Exercise:   Therapeutic Exercise:   - nu-step L5 95m while taking subjective and planning session with patient - cervical isometrics - frontal and sagittal plane, 5''x5 ea (HEP) - Corner stretch - 2x45'' - UE ranger - flexion - 2x10 - 28'' - shoulder Ext - 3x15 RTB - standing row - 3x15 RTB - ER isometric walkout - YTB - 2x10 - DNF endurance progression - x10 w/ chin tuck - partial ROM - Chin tuck in supine - 10x  The following were not completed today:  - PPT - 1' training (NT) - LTR - 20x (NT) - Row - 3x10 GTB (NT)   Manual Therapy: - STM sub occipital release, cervical paraspinals, sub occipitals   PT Short Term Goals - 12/19/20 1057       PT SHORT TERM GOAL #1   Title Deborah Williamson will be >75% HEP compliant to improve carryover between sessions and facilitate independent management of condition    Target Date 01/09/21               PT Long Term Goals - 12/26/20 0959       PT LONG TERM GOAL #1   Title Deborah Williamson will improve R cervical rotation to >/= 50 degrees  EVAL:  25 degrees with pain  target date: 02/13/21      PT LONG TERM GOAL #2   Title Deborah Williamson will improve 30'' STS (MCID 2) to >/= 7x to show improved LE strength and improved transfers  EVAL: 5x   target date: 02/13/21      PT LONG TERM GOAL #3   Title Deborah Williamson will demonstrate >110 degrees of active ROM in flexion to allow completion of activities involving reaching OH, not limited by pain  EVAL: 65 degrees  target date: 02/13/21      PT LONG TERM GOAL #4   Title Deborah Williamson will improve FOTO score from 53 (on evaluation) to 67 as a proxy for functional improvement  target date: 02/13/21      PT LONG TERM GOAL #5   Title Deborah Williamson will report >/= 25% decrease in pain from evaluation  EVAL: 10/10 max pain  target date: 02/13/21                   Plan  - 01/02/21 0958     Clinical Impression Statement Pt reports mild pain reduction following therapy  HEP was updated and reissued to patient; pt educated on HEP, was provided handout, and verbally confirmed understanding  of exercises.    Overall, Deborah Williamson is progressing fair with therapy.  Today we concentrated on cervicothoracic strengthening and pain reduction.  Pt continues to have high levels of pain (WAD) but does show functional improvement in cervical and shoulder ROM.  We added in higher rep band strengthenig for the shoulder today (high rep, low load).  Pt will continue to benefit from skilled physical therapy to address remaining deficits and achieve listed goals.  Continue per POC.    Stability/Clinical Decision Making Stable/Uncomplicated    Rehab Potential Fair    PT Frequency 2x / week    PT Duration 8 weeks    PT Treatment/Interventions ADLs/Self Care Home Management;Aquatic Therapy;Iontophoresis 4mg /ml Dexamethasone;Gait training;Therapeutic activities;Therapeutic exercise;Neuromuscular re-education;Manual techniques;Dry needling;Vasopneumatic Device;Spinal Manipulations;Joint Manipulations    PT Next Visit Plan Take FOTO and establish goal, Manual PRN, ROM for neck, pain education, information on PTSD group?, gentle strengthening, balance    PT Home Exercise Plan [email or web address]    Consulted and Agree with Plan of Care Patient             Patient will benefit from skilled therapeutic intervention in order to improve the following deficits and impairments:  Decreased balance, Difficulty walking, Impaired sensation, Decreased range of motion, Decreased activity tolerance, Decreased strength, Pain  Visit Diagnosis: Cervicalgia  Low back pain, unspecified back pain laterality, unspecified chronicity, unspecified whether sciatica present  Balance problem  Muscle weakness  Other abnormalities of gait and mobility     Problem List Patient Active Problem List    Diagnosis Date Noted   Genetic testing 02/29/2020   Family history of breast cancer    Family history of prostate cancer    Pruritus 07/16/2019   Night sweats 07/16/2019   PTSD (post-traumatic stress disorder)    Plantar fasciitis of right foot    OCD (obsessive compulsive disorder)    Obesity    Migraine    Insomnia    Dysrhythmia    Bipolar II disorder, most recent episode major depressive (HCC)    Anxiety    Primary osteoarthritis of left knee 03/10/2017   Morbid obesity (HCC) 01/02/2017   DJD (degenerative joint disease) of knee 01/02/2017   Severe recurrent major depression without psychotic features (HCC) 12/19/2015    Class: Chronic   Hyperlipidemia 10/12/2015   Varicose vein    Trichomonas vaginitis 10/24/2014   Left ovarian cyst 02/18/2013   Ascending aortic aneurysm (HCC) 02/18/2013   Takotsubo cardiomyopathy 02/18/2013   NSTEMI (non-ST elevated myocardial infarction) (HCC) 02/16/2013   Normocytic anemia 02/16/2013   Depression 02/25/2012   Intractable headache 12/21/2011   Primary hypertension 12/21/2011    12/23/2011, PT 01/02/2021, 9:59 AM  [facility] [address] [place], [place], [place] Phone: [phone]   Fax:  [phone]  Name: Deborah Williamson MRN: Deborah Williamson Date of Birth: 04-17-1960

## 2021-01-02 NOTE — Patient Instructions (Signed)
Access Code: [email or web address] URL: [email or web address] Date: 01/02/2021 Prepared by: Alphonzo Severance  Exercises Seated Cervical Rotation AROM - 1 x daily - 7 x weekly - 3 sets - 10 reps Seated Cervical Sidebending AROM - 1 x daily - 7 x weekly - 3 sets - 10 reps Seated Cervical Flexion AROM - 1 x daily - 7 x weekly - 3 sets - 10 reps Seated Cervical Extension AROM - 1 x daily - 7 x weekly - 3 sets - 10 reps Seated Flexion Stretch - 1 x daily - 7 x weekly - 3 sets - 10 reps Standing Isometric Cervical Sidebending with Manual Resistance - 1 x daily - 7 x weekly - 1 sets - 10 reps - 5'' hold Standing Isometric Cervical Flexion with Manual Resistance - 1 x daily - 7 x weekly - 1 sets - 10 reps - 5'' hold Standing Isometric Cervical Extension with Manual Resistance - 1 x daily - 7 x weekly - 1 sets - 10 reps - 5'' hold Standing Shoulder Row with Anchored Resistance - 1 x daily - 7 x weekly - 3 sets - 10 reps Shoulder Extension with Resistance - 1 x daily - 7 x weekly - 3 sets - 10 reps

## 2021-01-04 ENCOUNTER — Ambulatory Visit: Payer: Medicare HMO | Admitting: Physical Therapy

## 2021-01-04 ENCOUNTER — Other Ambulatory Visit: Payer: Self-pay

## 2021-01-04 ENCOUNTER — Encounter: Payer: Self-pay | Admitting: Physical Therapy

## 2021-01-04 DIAGNOSIS — M542 Cervicalgia: Secondary | ICD-10-CM

## 2021-01-04 DIAGNOSIS — R2689 Other abnormalities of gait and mobility: Secondary | ICD-10-CM

## 2021-01-04 DIAGNOSIS — M6281 Muscle weakness (generalized): Secondary | ICD-10-CM

## 2021-01-04 DIAGNOSIS — M545 Low back pain, unspecified: Secondary | ICD-10-CM

## 2021-01-04 NOTE — Therapy (Signed)
[facility] [address] [place], [place], [address] Phone: [phone]   Fax:  [phone]  Physical Therapy Treatment  Patient Details  Name: DASHAWN Williamson MRN: [id number] Date of Birth: July 21, 1960 Referring Provider (PT): Julieanne Manson, MD   Encounter Date: 01/04/2021   PT End of Session - 01/04/21 0931     Visit Number 5    Number of Visits 16    Date for PT Re-Evaluation 02/13/21    Authorization Type [employer] MCR    Progress Note Due on Visit 10    PT Start Time [phone]   pt arrived late   PT Stop Time 1000    PT Time Calculation (min) 29 min    Activity Tolerance Patient tolerated treatment well    Behavior During Therapy [facility] for tasks assessed/performed             Past Medical History:  Diagnosis Date   Anxiety    Ascending aortic aneurysm 01/2013   3.5-4 cm noted on CT-A   Bipolar II disorder, most recent episode major depressive (HCC)    Followed at [facility].  Cline Crock, counselor:  [phone]   DJD (degenerative joint disease) of knee 01/02/2017   Dysrhythmia    "irregular" (02/17/2013)   Family history of breast cancer    Family history of prostate cancer    Headache(784.0)    "probably once/week" (02/17/2013)   Hyperlipidemia    Hypertension    Insomnia    Left ovarian cyst 01/2013   Incidental CT finding   Migraine    "probably once/week" (02/17/2013)   Morbid obesity (HCC) 01/02/2017   Normocytic anemia    NSTEMI (non-ST elevated myocardial infarction) (HCC) 02/16/2013   Takotsubo cardiomyopathy, normal cors / Echocardiogram 4/22: EF 60-65, no RWMA, Gr 1 DD, GLS -22.1%, normal RVSF, RVSP 34.8, trivial MR, ascending aorta 39 mm   Obesity    OCD (obsessive compulsive disorder)    Plantar fasciitis of right foot    PTSD (post-traumatic stress disorder)    PTSD (post-traumatic stress disorder)    PTSD (post-traumatic stress disorder)    Takotsubo cardiomyopathy 01/2013   a. 2D  echo 02/17/13: EF 30-35%, periapical AK, normal RV size/function, moderate pulmonary HTN. c/w Takotsubo CM. ;  b.  Echo (04/2013): EF 55%, no WMA, Gr 1 DD, mildly dilated ascending aorta (39 mm), mild MR, mild BAE, PASP 33   Trichomonas vaginitis 10/2014    Past Surgical History:  Procedure Laterality Date   BRACHIOPLASTY Bilateral 05/2011   Plastic Surgery to remove loose skin after Weight Loss   CARDIAC CATHETERIZATION  02/17/2013   no angiographic evidence of CAD, EF 35%, HK or the anteroapical wall, apex and inferoapical wall   FOOT SURGERY Right    KNEE ARTHROSCOPY Right 02/05/2017   [facility]   LEFT HEART CATHETERIZATION WITH CORONARY ANGIOGRAM N/A 02/17/2013   Procedure: LEFT HEART CATHETERIZATION WITH CORONARY ANGIOGRAM;  Surgeon: Kathleene Hazel, MD;  Location[facility] CATH LAB;  Service: Cardiovascular;  Laterality: N/A;   MASS EXCISION Right 02/29/2016   Procedure: EXCISION MASS, right wrist;  Surgeon: Betha Loa, MD;  Location: [facility];  Service: Orthopedics;  Laterality: Right;  EXCISION MASS, right wrist   TOTAL KNEE ARTHROPLASTY Left 03/10/2017   Procedure: TOTAL KNEE ARTHROPLASTY;  Surgeon: Jodi Geralds, MD;  Location: [facility] OR;  Service: Orthopedics;  Laterality: Left;   TUBAL LIGATION  1989    There were no vitals filed for this visit.  Subjective Assessment - 01/04/21 0934     Subjective Pt report that she has been using a tennis ball at home for self TPR which is somewhat helpful.  She has been HEP compliant.  She feels like things are improving.  Pain intensity (L shoulder):  current 7/10 Aggs: hurts at rest, shoulder motions.  Eases: heat.  Pain intensity (LBP):   current 6-7/10  Aggs: movement, but pain at rest.  Eases: heat, rest.    Pertinent History Significant PMH: bipolar, hx of aortic aneurism, anemia, hx of MI (2014)                [facility] PT Assessment - 01/04/21 0001       AROM   Overall AROM Comments 115              [facility] Adult PT Treatment/Exercise:   Therapeutic Exercise:   - nu-step L5 36m while taking subjective and planning session with patient - cervical isometrics - frontal and sagittal plane, 5''x5 ea (HEP) - Corner stretch - 2x45'' - UE ranger - flexion - 2x10 - 28'' - unilateral shoulder Ext - 2x15 RTB - unilateral standing row - 2x15 blue TB - S/L ER - 2x15 (1#) - DNF endurance progression - x10 w/ chin tuck - partial ROM (NT) - Chin tuck in supine - 10x (NT)  Add S/L ER to HEP next visit   Manual Therapy: - STM sub occipital release, cervical paraspinals, sub occipitals     PT Short Term Goals - 12/19/20 1057       PT SHORT TERM GOAL #1   Title [name] will be >75% HEP compliant to improve carryover between sessions and facilitate independent management of condition    Target Date 01/09/21               PT Long Term Goals - 12/26/20 0959       PT LONG TERM GOAL #1   Title [name] will improve R cervical rotation to >/= 50 degrees  EVAL:  25 degrees with pain  target date: 02/13/21      PT LONG TERM GOAL #2   Title [name] will improve 30'' STS (MCID 2) to >/= 7x to show improved LE strength and improved transfers  EVAL: 5x   target date: 02/13/21      PT LONG TERM GOAL #3   Title [name] will demonstrate >110 degrees of active ROM in flexion to allow completion of activities involving reaching OH, not limited by pain  EVAL: 65 degrees  target date: 02/13/21      PT LONG TERM GOAL #4   Title SADEY YANDELL will improve FOTO score from 53 (on evaluation) to 67 as a proxy for functional improvement  target date: 02/13/21      PT LONG TERM GOAL #5   Title [name] will report >/= 25% decrease in pain from evaluation  EVAL: 10/10 max pain  target date: 02/13/21                   Plan - 01/04/21 1044     Clinical Impression Statement Pt reports mild pain reduction following therapy   HEP was reviewed, but left unchanged    Overall, PRANAVI AURE is progressing well with therapy.  Today we concentrated on cervicothoracic strengthening and hip range of motion.  Pt shows significant improvement in shoulder ROM today to 115.  She continues to report  decreased pain in her neck and shoulder.  We will continue progressing strengthening with manual PRN.  Pt will continue to benefit from skilled physical therapy to address remaining deficits and achieve listed goals.  Continue per POC.    Stability/Clinical Decision Making Stable/Uncomplicated    Rehab Potential Fair    PT Frequency 2x / week    PT Duration 8 weeks    PT Treatment/Interventions ADLs/Self Care Home Management;Aquatic Therapy;Iontophoresis 4mg /ml Dexamethasone;Gait training;Therapeutic activities;Therapeutic exercise;Neuromuscular re-education;Manual techniques;Dry needling;Vasopneumatic Device;Spinal Manipulations;Joint Manipulations    PT Next Visit Plan Take FOTO and establish goal, Manual PRN, ROM for neck, pain education, information on PTSD group?, gentle strengthening, balance    PT Home Exercise Plan [email or web address]    Consulted and Agree with Plan of Care Patient             Patient will benefit from skilled therapeutic intervention in order to improve the following deficits and impairments:  Decreased balance, Difficulty walking, Impaired sensation, Decreased range of motion, Decreased activity tolerance, Decreased strength, Pain  Visit Diagnosis: Cervicalgia  Low back pain, unspecified back pain laterality, unspecified chronicity, unspecified whether sciatica present  Balance problem  Muscle weakness  Other abnormalities of gait and mobility     Problem List Patient Active Problem List   Diagnosis Date Noted   Genetic testing 02/29/2020   Family history of breast cancer    Family history of prostate cancer    Pruritus 07/16/2019   Night sweats 07/16/2019   PTSD (post-traumatic stress  disorder)    Plantar fasciitis of right foot    OCD (obsessive compulsive disorder)    Obesity    Migraine    Insomnia    Dysrhythmia    Bipolar II disorder, most recent episode major depressive (HCC)    Anxiety    Primary osteoarthritis of left knee 03/10/2017   Morbid obesity (HCC) 01/02/2017   DJD (degenerative joint disease) of knee 01/02/2017   Severe recurrent major depression without psychotic features (HCC) 12/19/2015    Class: Chronic   Hyperlipidemia 10/12/2015   Varicose vein    Trichomonas vaginitis 10/24/2014   Left ovarian cyst 02/18/2013   Ascending aortic aneurysm (HCC) 02/18/2013   Takotsubo cardiomyopathy 02/18/2013   NSTEMI (non-ST elevated myocardial infarction) (HCC) 02/16/2013   Normocytic anemia 02/16/2013   Depression 02/25/2012   Intractable headache 12/21/2011   Primary hypertension 12/21/2011    12/23/2011, PT 01/04/2021, 10:44 AM  [facility] [address] [place], [place], [place] Phone: [phone]   Fax:  [phone]  Name: GUILLERMO DIFRANCESCO MRN: [name] Date of Birth: 1960-11-22

## 2021-01-09 ENCOUNTER — Ambulatory Visit: Payer: Medicare HMO | Admitting: Physical Therapy

## 2021-01-09 ENCOUNTER — Other Ambulatory Visit: Payer: Self-pay

## 2021-01-09 ENCOUNTER — Encounter: Payer: Self-pay | Admitting: Physical Therapy

## 2021-01-09 DIAGNOSIS — M542 Cervicalgia: Secondary | ICD-10-CM | POA: Diagnosis not present

## 2021-01-09 DIAGNOSIS — M545 Low back pain, unspecified: Secondary | ICD-10-CM

## 2021-01-09 DIAGNOSIS — M6281 Muscle weakness (generalized): Secondary | ICD-10-CM

## 2021-01-09 DIAGNOSIS — R2689 Other abnormalities of gait and mobility: Secondary | ICD-10-CM

## 2021-01-09 NOTE — Therapy (Signed)
[place], [place], [address] Phone: [phone]   Fax:  [phone]  Physical Therapy Treatment  Patient Details  Name: Deborah Williamson MRN: [id number] Date of Birth: 09-28-1960 Referring Provider (PT): Mack Hook, MD   Encounter Date: 01/09/2021   PT End of Session - 01/09/21 0915     Visit Number 6    Number of Visits 16    Date for PT Re-Evaluation 02/13/21    Authorization Type [employer] MCR    Progress Note Due on Visit 10    PT Start Time 0915    PT Stop Time 1000    PT Time Calculation (min) 45 min    Activity Tolerance Patient tolerated treatment well    Behavior During Therapy [facility] for tasks assessed/performed             Past Medical History:  Diagnosis Date   Anxiety    Ascending aortic aneurysm 01/2013   3.5-4 cm noted on CT-A   Bipolar II disorder, most recent episode major depressive ([place])    Followed at [facility].  Ernestine Conrad, counselor:  [phone]   DJD (degenerative joint disease) of knee 01/02/2017   Dysrhythmia    "irregular" (02/17/2013)   Family history of breast cancer    Family history of prostate cancer    Headache(784.0)    "probably once/week" (02/17/2013)   Hyperlipidemia    Hypertension    Insomnia    Left ovarian cyst 01/2013   Incidental CT finding   Migraine    "probably once/week" (02/17/2013)   Morbid obesity ([place]) 01/02/2017   Normocytic anemia    NSTEMI (non-ST elevated myocardial infarction) ([place]) 02/16/2013   Takotsubo cardiomyopathy, normal cors / Echocardiogram 4/22: EF 60-65, no RWMA, Gr 1 DD, GLS -22.1%, normal RVSF, RVSP 34.8, trivial MR, ascending aorta 39 mm   Obesity    OCD (obsessive compulsive disorder)    Plantar fasciitis of right foot    PTSD (post-traumatic stress disorder)    PTSD (post-traumatic stress disorder)    PTSD (post-traumatic stress disorder)    Takotsubo cardiomyopathy 01/2013   a. 2D echo 02/17/13:  EF 30-35%, periapical AK, normal RV size/function, moderate pulmonary HTN. c/w Takotsubo CM. ;  b.  Echo (04/2013): EF 55%, no WMA, Gr 1 DD, mildly dilated ascending aorta (39 mm), mild MR, mild BAE, PASP 33   Trichomonas vaginitis 10/2014    Past Surgical History:  Procedure Laterality Date   BRACHIOPLASTY Bilateral 05/2011   Plastic Surgery to remove loose skin after Weight Loss   CARDIAC CATHETERIZATION  02/17/2013   no angiographic evidence of CAD, EF 35%, HK or the anteroapical wall, apex and inferoapical wall   FOOT SURGERY Right    KNEE ARTHROSCOPY Right 02/05/2017   [facility]   LEFT HEART CATHETERIZATION WITH CORONARY ANGIOGRAM N/A 02/17/2013   Procedure: LEFT HEART CATHETERIZATION WITH CORONARY ANGIOGRAM;  Surgeon: Burnell Blanks, MD;  Location: [facility] CATH LAB;  Service: Cardiovascular;  Laterality: N/A;   MASS EXCISION Right 02/29/2016   Procedure: EXCISION MASS, right wrist;  Surgeon: Leanora Cover, MD;  Location: [place];  Service: Orthopedics;  Laterality: Right;  EXCISION MASS, right wrist   TOTAL KNEE ARTHROPLASTY Left 03/10/2017   Procedure: TOTAL KNEE ARTHROPLASTY;  Surgeon: Dorna Leitz, MD;  Location: [place];  Service: Orthopedics;  Laterality: Left;   TUBAL LIGATION  1989    There were no vitals filed for this visit.   Subjective Assessment -  01/09/21 0919     Subjective Pt reports that things are headed in the right direction.  She still feels stiff, but this is improving.  Pain intensity (L shoulder):  current 6/10 Aggs: hurts at rest, shoulder motions.  Eases: heat.  Pain intensity (LBP):   current 6/10  Aggs: movement, but pain at rest.  Eases: heat, rest.    Pertinent History Significant PMH: bipolar, hx of aortic aneurism, anemia, hx of MI (2014)                [facility] PT Assessment - 01/09/21 0001       Observation/Other Assessments   Focus on Therapeutic Outcomes (FOTO)  81             [facility] Adult PT  Treatment/Exercise:   Therapeutic Exercise:   - UBE 13mwhile taking subjective and planning session with patient - Corner stretch - 2x45'' - UE ranger - flexion - 2x10 - 334' - ball roll on wall - CC/CW - red - 2' total - unilateral shoulder Ext - 2x15 RTB - unilateral standing row - 2x15 blue TB - S/L ER - 2x15 (2#) - DNF endurance progression - 20'' x3   Manual Therapy: - STM sub occipital release, cervical paraspinals, sub occipitals  Therapeutic Activity - collecting information for FOTO and reviewing with patient     PT Short Term Goals - 01/09/21 0921       PT SHORT TERM GOAL #1   Title MServando Snarewill be >75% HEP compliant to improve carryover between sessions and facilitate independent management of condition    Baseline 10/18: MET    Status Achieved    Target Date 01/09/21               PT Long Term Goals - 01/09/21 1002       PT LONG TERM GOAL #1   Title MServando Snarewill improve R cervical rotation to >/= 50 degrees  EVAL:  25 degrees with pain  target date: 02/13/21      PT LONG TERM GOAL #2   Title MServando Snarewill improve 30'' STS (MCID 2) to >/= 7x to show improved LE strength and improved transfers  EVAL: 5x   target date: 02/13/21      PT LONG TERM GOAL #3   Title MServando Snarewill demonstrate >110 degrees of active ROM in flexion to allow completion of activities involving reaching OH, not limited by pain  EVAL: 65 degrees  target date: 02/13/21      PT LONG TERM GOAL #4   Title MHALLA CHOPPwill improve FOTO score from 53 (on evaluation) to 67 as a proxy for functional improvement  target date: 02/13/21    Baseline 10/18: 4101   Status On-going      PT LONG TERM GOAL #5   Title MSTAISHA WINIARSKIwill report >/= 25% decrease in pain from evaluation  EVAL: 10/10 max pain  target date: 02/13/21                   Plan - 01/09/21 1000     Clinical Impression Statement Pt reports a mild  increase in pain following therapy  HEP was updated and reissued to patient; pt educated on HEP, was provided handout, and verbally confirmed understanding of exercises.    Overall, MLAMANDA RUDDERis progressing well with therapy.  Today we concentrated on cervicothoracic strengthening.  Pt shows consistent increase  in L shoulder ROM with ranger and increased activity tolerance.  She reports significant functional improvement in the use of her L shoulder.  She does still exhibit high levels of pain.  Pt will continue to benefit from skilled physical therapy to address remaining deficits and achieve listed goals.  Continue per POC.    Stability/Clinical Decision Making Stable/Uncomplicated    Rehab Potential Fair    PT Frequency 2x / week    PT Duration 8 weeks    PT Treatment/Interventions ADLs/Self Care Home Management;Aquatic Therapy;Iontophoresis 63m/ml Dexamethasone;Gait training;Therapeutic activities;Therapeutic exercise;Neuromuscular re-education;Manual techniques;Dry needling;Vasopneumatic Device;Spinal Manipulations;Joint Manipulations    PT Next Visit Plan Take FOTO and establish goal, Manual PRN, ROM for neck, pain education, information on PTSD group?, gentle strengthening, balance    PT Home Exercise Plan [id number]    Consulted and Agree with Plan of Care Patient             Patient will benefit from skilled therapeutic intervention in order to improve the following deficits and impairments:  Decreased balance, Difficulty walking, Impaired sensation, Decreased range of motion, Decreased activity tolerance, Decreased strength, Pain  Visit Diagnosis: Cervicalgia  Low back pain, unspecified back pain laterality, unspecified chronicity, unspecified whether sciatica present  Balance problem  Muscle weakness     Problem List Patient Active Problem List   Diagnosis Date Noted   Genetic testing 02/29/2020   Family history of breast cancer    Family history of prostate  cancer    Pruritus 07/16/2019   Night sweats 07/16/2019   PTSD (post-traumatic stress disorder)    Plantar fasciitis of right foot    OCD (obsessive compulsive disorder)    Obesity    Migraine    Insomnia    Dysrhythmia    Bipolar II disorder, most recent episode major depressive ([place]    Anxiety    Primary osteoarthritis of left knee 03/10/2017   Morbid [place] ([place] 01/02/2017   DJD (degenerative joint disease) of knee 01/02/2017   Severe recurrent major depression without psychotic features ([place] 12/19/2015    Class: Chronic   Hyperlipidemia 10/12/2015   Varicose vein    Trichomonas vaginitis 10/24/2014   Left ovarian cyst 02/18/2013   [place]ending [place] ([place] 02/18/2013   Takotsubo cardiomyopathy 02/18/2013   NSTEMI (non-ST elevated myocardial infarction) ([place] 02/16/2013   Normocytic anemia 02/16/2013   Depression 02/25/2012   Intractable headache 12/21/2011   Primary hypertension 12/21/2011    KMathis Dad PT 01/09/2021, 10:08 AM  [facility] [address]Phone: [phone]  Fax:  [phone] Name: MSEILA LISTONMRN: [id number] of Birth: 112-15-1962

## 2021-01-11 ENCOUNTER — Encounter: Payer: Self-pay | Admitting: Physical Therapy

## 2021-01-11 ENCOUNTER — Ambulatory Visit: Payer: Medicare HMO | Admitting: Physical Therapy

## 2021-01-11 ENCOUNTER — Other Ambulatory Visit: Payer: Self-pay

## 2021-01-11 DIAGNOSIS — M542 Cervicalgia: Secondary | ICD-10-CM | POA: Diagnosis not present

## 2021-01-11 DIAGNOSIS — M545 Low back pain, unspecified: Secondary | ICD-10-CM

## 2021-01-11 DIAGNOSIS — M6281 Muscle weakness (generalized): Secondary | ICD-10-CM

## 2021-01-11 DIAGNOSIS — R2689 Other abnormalities of gait and mobility: Secondary | ICD-10-CM

## 2021-01-11 NOTE — Therapy (Signed)
[place], [place], [address] Phone: [phone]   Fax:  [phone]  Physical Therapy Treatment  Patient Details  Name: Deborah Williamson MRN: [id number] Date of Birth: 23-Mar-1961 Referring Provider (PT): Mack Hook, MD   Encounter Date: 01/11/2021   PT End of Session - 01/11/21 0916     Visit Number 7    Number of Visits 16    Date for PT Re-Evaluation 02/13/21    Authorization Type [employer] MCR    Progress Note Due on Visit 10    PT Start Time 0915    PT Stop Time 1000    PT Time Calculation (min) 45 min    Activity Tolerance Patient tolerated treatment well    Behavior During Therapy [facility] for tasks assessed/performed             Past Medical History:  Diagnosis Date   Anxiety    Ascending aortic aneurysm 01/2013   3.5-4 cm noted on CT-A   Bipolar II disorder, most recent episode major depressive ([place])    Followed at [facility].  Ernestine Conrad, counselor:  [phone]   DJD (degenerative joint disease) of knee 01/02/2017   Dysrhythmia    "irregular" (02/17/2013)   Family history of breast cancer    Family history of prostate cancer    Headache(784.0)    "probably once/week" (02/17/2013)   Hyperlipidemia    Hypertension    Insomnia    Left ovarian cyst 01/2013   Incidental CT finding   Migraine    "probably once/week" (02/17/2013)   Morbid obesity ([place]) 01/02/2017   Normocytic anemia    NSTEMI (non-ST elevated myocardial infarction) ([place]) 02/16/2013   Takotsubo cardiomyopathy, normal cors / Echocardiogram 4/22: EF 60-65, no RWMA, Gr 1 DD, GLS -22.1%, normal RVSF, RVSP 34.8, trivial MR, ascending aorta 39 mm   Obesity    OCD (obsessive compulsive disorder)    Plantar fasciitis of right foot    PTSD (post-traumatic stress disorder)    PTSD (post-traumatic stress disorder)    PTSD (post-traumatic stress disorder)    Takotsubo cardiomyopathy 01/2013   a. 2D echo 02/17/13:  EF 30-35%, periapical AK, normal RV size/function, moderate pulmonary HTN. c/w Takotsubo CM. ;  b.  Echo (04/2013): EF 55%, no WMA, Gr 1 DD, mildly dilated ascending aorta (39 mm), mild MR, mild BAE, PASP 33   Trichomonas vaginitis 10/2014    Past Surgical History:  Procedure Laterality Date   BRACHIOPLASTY Bilateral 05/2011   Plastic Surgery to remove loose skin after Weight Loss   CARDIAC CATHETERIZATION  02/17/2013   no angiographic evidence of CAD, EF 35%, HK or the anteroapical wall, apex and inferoapical wall   FOOT SURGERY Right    KNEE ARTHROSCOPY Right 02/05/2017   [facility]   LEFT HEART CATHETERIZATION WITH CORONARY ANGIOGRAM N/A 02/17/2013   Procedure: LEFT HEART CATHETERIZATION WITH CORONARY ANGIOGRAM;  Surgeon: Burnell Blanks, MD;  Location: [facility] C[facility] LAB;  Service: Cardiovascular;  Laterality: N/A;   MASS EXCISION Right 02/29/2016   Procedure: EXCISION MASS, right wrist;  Surgeon: Leanora Cover, MD;  Location: [place];  Service: Orthopedics;  Laterality: Right;  EXCISION MASS, right wrist   TOTAL KNEE ARTHROPLASTY Left 03/10/2017   Procedure: TOTAL KNEE ARTHROPLASTY;  Surgeon: Dorna Leitz, MD;  Location: [place];  Service: Orthopedics;  Laterality: Left;   TUBAL LIGATION  1989    There were no vitals filed for this visit.   Subjective Assessment -  01/11/21 0920     Subjective Pt reports that since she started therapy she "has hope"  she reports that her sxs are minimal considering the cold weatther.  Pain intensity (L shoulder):  current 6/10 Aggs: hurts at rest, shoulder motions.  Eases: heat.  Pain intensity (LBP):   current 6/10  Aggs: movement, but pain at rest.  Eases: heat, rest.    Pertinent History Significant PMH: bipolar, hx of aortic aneurism, anemia, hx of MI (2014)             [facility] Adult PT Treatment/Exercise:   Therapeutic Exercise:   - UBE 74mwhile taking subjective and planning session with patient - Corner stretch -  3x45'' - UE ranger - flexion - 2x10 - 33'' - ball roll on wall - CC/CW - 2' total - unilateral shoulder Ext - 2x15 GTB - unilateral standing row - 2x15 black TB - S/L ER - 2x15 (2#) - DNF endurance progression - 30'' x3 - farmer's carry - 5# - 280' x2   Manual Therapy: - STM sub occipital release, cervical paraspinals, sub occipitals    PT Short Term Goals - 01/09/21 0921       PT SHORT TERM GOAL #1   Title Deborah Snarewill be >75% HEP compliant to improve carryover between sessions and facilitate independent management of condition    Baseline 10/18: MET    Status Achieved    Target Date 01/09/21               PT Long Term Goals - 01/09/21 1002       PT LONG TERM GOAL #1   Title Deborah Snarewill improve R cervical rotation to >/= 50 degrees  EVAL:  25 degrees with pain  target date: 02/13/21      PT LONG TERM GOAL #2   Title Deborah Snarewill improve 30'' STS (MCID 2) to >/= 7x to show improved LE strength and improved transfers  EVAL: 5x   target date: 02/13/21      PT LONG TERM GOAL #3   Title Deborah Snarewill demonstrate >110 degrees of active ROM in flexion to allow completion of activities involving reaching OH, not limited by pain  EVAL: 65 degrees  target date: 02/13/21      PT LONG TERM GOAL #4   Title Deborah GOMEZwill improve FOTO score from 53 (on evaluation) to 67 as a proxy for functional improvement  target date: 02/13/21    Baseline 10/18: 439   Status On-going      PT LONG TERM GOAL #5   Title Deborah PERRASwill report >/= 25% decrease in pain from evaluation  EVAL: 10/10 max pain  target date: 02/13/21                   Plan - 01/11/21 0943     Clinical Impression Statement Pt reports a mild increase in pain following therapy  HEP was reviewed, but left unchanged    Overall, Deborah ZUNKERis progressing well with therapy.  Today we concentrated on cervicothoracic strengthening,  shoulder range of motion, and cervical mobility .  Pt continues to tolerate higher loads and volume during therapy.  Her pain score remains at about 6/10, but she is functionally progression with reaching OH an ability to complete ADLs.  Pt shows L shoulder shrug with row and shoulder ext which was corrected with mirror feedback.  Pt will continue  to benefit from skilled physical therapy to address remaining deficits and achieve listed goals.  Continue per POC.    Stability/Clinical Decision Making Stable/Uncomplicated    Rehab Potential Fair    PT Frequency 2x / week    PT Duration 8 weeks    PT Treatment/Interventions ADLs/Self Care Home Management;Aquatic Therapy;Iontophoresis 27m/ml Dexamethasone;Gait training;Therapeutic activities;Therapeutic exercise;Neuromuscular re-education;Manual techniques;Dry needling;Vasopneumatic Device;Spinal Manipulations;Joint Manipulations    PT Next Visit Plan Take FOTO and establish goal, Manual PRN, ROM for neck, pain education, information on PTSD group?, gentle strengthening, balance    PT Home Exercise Plan [email or web address]    Consulted and Agree with Plan of Care Patient             Patient will benefit from skilled therapeutic intervention in order to improve the following deficits and impairments:  Decreased balance, Difficulty walking, Impaired sensation, Decreased range of motion, Decreased activity tolerance, Decreased strength, Pain  Visit Diagnosis: Cervicalgia  Low back pain, unspecified back pain laterality, unspecified chronicity, unspecified whether sciatica present  Balance problem  Muscle weakness  Other abnormalities of gait and mobility     Problem List Patient Active Problem List   Diagnosis Date Noted   Genetic testing 02/29/2020   Family history of breast cancer    Family history of prostate cancer    Pruritus 07/16/2019   Night sweats 07/16/2019   PTSD (post-traumatic stress disorder)    Plantar fasciitis of right foot     OCD (obsessive compulsive disorder)    Obesity    Migraine    Insomnia    Dysrhythmia    Bipolar II disorder, most recent episode major depressive ([place]    Anxiety    Primary osteoarthritis of left knee 03/10/2017   Morbid obesity ([place] 01/02/2017   DJD (degenerative joint disease) of knee 01/02/2017   Severe recurrent major depression without psychotic features ([place] 12/19/2015    Class: Chronic   Hyperlipidemia 10/12/2015   Varicose vein    Trichomonas vaginitis 10/24/2014   Left ovarian cyst 02/18/2013   Ascending aortic aneurysm ([place] 02/18/2013   Takotsubo cardiomyopathy 02/18/2013   NSTEMI (non-ST elevated myocardial infarction) ([place] 02/16/2013   Normocytic anemia 02/16/2013   Depression 02/25/2012   Intractable headache 12/21/2011   Primary hypertension 12/21/2011    KMathis Dad PT 01/11/2021, 9:57 AM  [facility] [address]Phone: [phone]  Fax:  [phone] Name: MRUMOR SUNMRN: [id number] of Birth: [birth year]

## 2021-01-15 ENCOUNTER — Other Ambulatory Visit: Payer: Self-pay

## 2021-01-15 ENCOUNTER — Other Ambulatory Visit (INDEPENDENT_AMBULATORY_CARE_PROVIDER_SITE_OTHER): Payer: Medicare HMO | Admitting: Internal Medicine

## 2021-01-15 DIAGNOSIS — R7309 Other abnormal glucose: Secondary | ICD-10-CM

## 2021-01-15 DIAGNOSIS — Z79899 Other long term (current) drug therapy: Secondary | ICD-10-CM

## 2021-01-15 DIAGNOSIS — E782 Mixed hyperlipidemia: Secondary | ICD-10-CM

## 2021-01-16 ENCOUNTER — Ambulatory Visit: Payer: Medicare HMO | Admitting: Physical Therapy

## 2021-01-16 ENCOUNTER — Encounter: Payer: Self-pay | Admitting: Physical Therapy

## 2021-01-16 DIAGNOSIS — M542 Cervicalgia: Secondary | ICD-10-CM | POA: Diagnosis not present

## 2021-01-16 DIAGNOSIS — R2689 Other abnormalities of gait and mobility: Secondary | ICD-10-CM

## 2021-01-16 DIAGNOSIS — M6281 Muscle weakness (generalized): Secondary | ICD-10-CM

## 2021-01-16 DIAGNOSIS — M545 Low back pain, unspecified: Secondary | ICD-10-CM

## 2021-01-16 LAB — CBC WITH DIFFERENTIAL/PLATELET
Basophils Absolute: 0 10*3/uL (ref 0.0–0.2)
Basos: 1 %
EOS (ABSOLUTE): 0.1 10*3/uL (ref 0.0–0.4)
Eos: 3 %
Hematocrit: 34 % (ref 34.0–46.6)
Hemoglobin: 10.7 g/dL — ABNORMAL LOW (ref 11.1–15.9)
Immature Grans (Abs): 0 10*3/uL (ref 0.0–0.1)
Immature Granulocytes: 0 %
Lymphocytes Absolute: 1.4 10*3/uL (ref 0.7–3.1)
Lymphs: 35 %
MCH: 27 pg (ref 26.6–33.0)
MCHC: 31.5 g/dL (ref 31.5–35.7)
MCV: 86 fL (ref 79–97)
Monocytes Absolute: 0.3 10*3/uL (ref 0.1–0.9)
Monocytes: 8 %
Neutrophils Absolute: 2.2 10*3/uL (ref 1.4–7.0)
Neutrophils: 53 %
Platelets: 267 10*3/uL (ref 150–450)
RBC: 3.96 x10E6/uL (ref 3.77–5.28)
RDW: 13.7 % (ref 11.7–15.4)
WBC: 4 10*3/uL (ref 3.4–10.8)

## 2021-01-16 LAB — LIPID PANEL W/O CHOL/HDL RATIO
Cholesterol, Total: 126 mg/dL (ref 100–199)
HDL: 47 mg/dL (ref >39)
LDL Chol Calc (NIH): 68 mg/dL (ref 0–99)
Triglycerides: 45 mg/dL (ref 0–149)
VLDL Cholesterol Cal: 11 mg/dL (ref 5–40)

## 2021-01-16 LAB — COMPREHENSIVE METABOLIC PANEL
ALT: 13 IU/L (ref 0–32)
AST: 19 IU/L (ref 0–40)
Albumin/Globulin Ratio: 1.2 (ref 1.2–2.2)
Albumin: 3.6 g/dL — ABNORMAL LOW (ref 3.8–4.9)
Alkaline Phosphatase: 82 IU/L (ref 44–121)
BUN/Creatinine Ratio: 28 — ABNORMAL HIGH (ref 9–23)
BUN: 22 mg/dL (ref 6–24)
Bilirubin Total: 0.4 mg/dL (ref 0.0–1.2)
CO2: 21 mmol/L (ref 20–29)
Calcium: 9.1 mg/dL (ref 8.7–10.2)
Chloride: 101 mmol/L (ref 96–106)
Creatinine, Ser: 0.79 mg/dL (ref 0.57–1.00)
Globulin, Total: 2.9 g/dL (ref 1.5–4.5)
Glucose: 101 mg/dL — ABNORMAL HIGH (ref 70–99)
Potassium: 3.7 mmol/L (ref 3.5–5.2)
Sodium: 139 mmol/L (ref 134–144)
Total Protein: 6.5 g/dL (ref 6.0–8.5)
eGFR: 86 mL/min/{1.73_m2} (ref >59)

## 2021-01-16 LAB — HEMOGLOBIN A1C
Est. average glucose Bld gHb Est-mCnc: 114 mg/dL
Hgb A1c MFr Bld: 5.6 % (ref 4.8–5.6)

## 2021-01-16 NOTE — Therapy (Signed)
[place], [place], [address] Phone: [phone]   Fax:  [phone]  Physical Therapy Treatment  Patient Details  Name: Deborah Williamson MRN: [id number] Date of Birth: 02/12/1961 Referring Provider (PT): Mack Hook, MD   Encounter Date: 01/16/2021   PT End of Session - 01/16/21 0922     Visit Number 8    Number of Visits 16    Date for PT Re-Evaluation 02/13/21    Authorization Type [employer] MCR    Progress Note Due on Visit 10    PT Start Time 0920    PT Stop Time 1000    PT Time Calculation (min) 40 min    Activity Tolerance Patient tolerated treatment well    Behavior During Therapy [facility] for tasks assessed/performed             Past Medical History:  Diagnosis Date   Anxiety    Ascending aortic aneurysm 01/2013   3.5-4 cm noted on CT-A   Bipolar II disorder, most recent episode major depressive ([place])    Followed at [facility].  Ernestine Conrad, counselor:  [phone]   DJD (degenerative joint disease) of knee 01/02/2017   Dysrhythmia    "irregular" (02/17/2013)   Family history of breast cancer    Family history of prostate cancer    Headache(784.0)    "probably once/week" (02/17/2013)   Hyperlipidemia    Hypertension    Insomnia    Left ovarian cyst 01/2013   Incidental CT finding   Migraine    "probably once/week" (02/17/2013)   Morbid obesity ([place]) 01/02/2017   Normocytic anemia    NSTEMI (non-ST elevated myocardial infarction) ([place]) 02/16/2013   Takotsubo cardiomyopathy, normal cors / Echocardiogram 4/22: EF 60-65, no RWMA, Gr 1 DD, GLS -22.1%, normal RVSF, RVSP 34.8, trivial MR, ascending aorta 39 mm   Obesity    OCD (obsessive compulsive disorder)    Plantar fasciitis of right foot    PTSD (post-traumatic stress disorder)    PTSD (post-traumatic stress disorder)    PTSD (post-traumatic stress disorder)    Takotsubo cardiomyopathy 01/2013   a. 2D echo 02/17/13:  EF 30-35%, periapical AK, normal RV size/function, moderate pulmonary HTN. c/w Takotsubo CM. ;  b.  Echo (04/2013): EF 55%, no WMA, Gr 1 DD, mildly dilated ascending aorta (39 mm), mild MR, mild BAE, PASP 33   Trichomonas vaginitis 10/2014    Past Surgical History:  Procedure Laterality Date   BRACHIOPLASTY Bilateral 05/2011   Plastic Surgery to remove loose skin after Weight Loss   CARDIAC CATHETERIZATION  02/17/2013   no angiographic evidence of CAD, EF 35%, HK or the anteroapical wall, apex and inferoapical wall   FOOT SURGERY Right    KNEE ARTHROSCOPY Right 02/05/2017   [facility]   LEFT HEART CATHETERIZATION WITH CORONARY ANGIOGRAM N/A 02/17/2013   Procedure: LEFT HEART CATHETERIZATION WITH CORONARY ANGIOGRAM;  Surgeon: Burnell Blanks, MD;  [facility];  Service: Cardiovascular;  Laterality: N/A;   MASS EXCISION Right 02/29/2016   Procedure: EXCISION MASS, right wrist;  Surgeon: Leanora Cover, MD;  Location: [place];  Service: Orthopedics;  Laterality: Right;  EXCISION MASS, right wrist   TOTAL KNEE ARTHROPLASTY Left 03/10/2017   Procedure: TOTAL KNEE ARTHROPLASTY;  Surgeon: Dorna Leitz, MD;  Location: [place];  Service: Orthopedics;  Laterality: Left;   TUBAL LIGATION  1989    There were no vitals filed for this visit.   Subjective Assessment -  01/16/21 0923     Subjective Pt reports that she continues to improve.  She still has pain but "it's not like it was".  Pain intensity (L shoulder):  current 5/10 Aggs: hurts at rest, shoulder motions.  Eases: heat.  Pain intensity (LBP):   current /10  Aggs: movement, but pain at rest.  Eases: heat, rest.    Pertinent History Significant PMH: bipolar, hx of aortic aneurism, anemia, hx of MI (2014)             [facility] Adult PT Treatment/Exercise:   Therapeutic Exercise:   - UBE 81mwhile taking subjective and planning session with patient - Corner stretch - 3x45'' - UE ranger - flexion - 2x10 -  33'' - ball roll on wall - CC/CW - 2' total - Reaching to 3rd shelf in cabinet- 2x10 - unilateral shoulder Ext - 2x15 GTB - unilateral standing row - 2x15 black TB - S/L ER - 3x15 (2#) - DNF endurance progression - 45''x2 - farmer's carry - 8# - 280' x2   Manual Therapy: - STM sub occipital release, cervical paraspinals, sub occipitals    PT Short Term Goals - 01/09/21 0921       PT SHORT TERM GOAL #1   Title Deborah Snarewill be >75% HEP compliant to improve carryover between sessions and facilitate independent management of condition    Baseline 10/18: MET    Status Achieved    Target Date 01/09/21               PT Long Term Goals - 01/09/21 1002       PT LONG TERM GOAL #1   Title Deborah Snarewill improve R cervical rotation to >/= 50 degrees  EVAL:  25 degrees with pain  target date: 02/13/21      PT LONG TERM GOAL #2   Title Deborah Snarewill improve 30'' STS (MCID 2) to >/= 7x to show improved LE strength and improved transfers  EVAL: 5x   target date: 02/13/21      PT LONG TERM GOAL #3   Title Deborah Snarewill demonstrate >110 degrees of active ROM in flexion to allow completion of activities involving reaching OH, not limited by pain  EVAL: 65 degrees  target date: 02/13/21      PT LONG TERM GOAL #4   Title Deborah PAPANIAwill improve FOTO score from 53 (on evaluation) to 67 as a proxy for functional improvement  target date: 02/13/21    Baseline 10/18: 437   Status On-going      PT LONG TERM GOAL #5   Title Deborah SEIGLERwill report >/= 25% decrease in pain from evaluation  EVAL: 10/10 max pain  target date: 02/13/21                   Plan - 01/16/21 0958     Clinical Impression Statement Pt reports a mild increase in pain following therapy  HEP was reviewed, but left unchanged    Overall, Deborah Williamson progressing well with therapy.  Today we concentrated on rotator cuff strengthening,  cervicothoracic strengthening, and shoulder range of motion.  Pt continues to show functional progression with ability to reach top shelf repeatedly today.  She is still having pain with OH movement but feels she is able to do much more at home.  Pt will continue to benefit from skilled physical therapy to address remaining deficits and  achieve listed goals.  Continue per POC.    Stability/Clinical Decision Making Stable/Uncomplicated    Rehab Potential Fair    PT Frequency 2x / week    PT Duration 8 weeks    PT Treatment/Interventions ADLs/Self Care Home Management;Aquatic Therapy;Iontophoresis 11m/ml Dexamethasone;Gait training;Therapeutic activities;Therapeutic exercise;Neuromuscular re-education;Manual techniques;Dry needling;Vasopneumatic Device;Spinal Manipulations;Joint Manipulations    PT Next Visit Plan Take FOTO and establish goal, Manual PRN, ROM for neck, pain education, information on PTSD group?, gentle strengthening, balance    PT Home Exercise Plan [email or web address]    Consulted and Agree with Plan of Care Patient             Patient will benefit from skilled therapeutic intervention in order to improve the following deficits and impairments:  Decreased balance, Difficulty walking, Impaired sensation, Decreased range of motion, Decreased activity tolerance, Decreased strength, Pain  Visit Diagnosis: Cervicalgia  Low back pain, unspecified back pain laterality, unspecified chronicity, unspecified whether sciatica present  Balance problem  Muscle weakness     Problem List Patient Active Problem List   Diagnosis Date Noted   Genetic testing 02/29/2020   Family history of breast cancer    Family history of prostate cancer    Pruritus 07/16/2019   Night sweats 07/16/2019   PTSD (post-traumatic stress disorder)    Plantar fasciitis of right foot    OCD (obsessive compulsive disorder)    Obesity    Migraine    Insomnia    Dysrhythmia    Bipolar II disorder, most recent  episode major depressive ([place]    Anxiety    Primary osteoarthritis of left knee 03/10/2017   Morbid obesity ([place] 01/02/2017   DJD (degenerative joint disease) of knee 01/02/2017   Severe recurrent major depression without psychotic features ([place] 12/19/2015    Class: Chronic   Hyperlipidemia 10/12/2015   Varicose vein    Trichomonas vaginitis 10/24/2014   Left ovarian cyst 02/18/2013   Ascending aortic aneurysm ([place] 02/18/2013   Takotsubo cardiomyopathy 02/18/2013   NSTEMI (non-ST elevated myocardial infarction) ([place] 02/16/2013   Normocytic anemia 02/16/2013   Depression 02/25/2012   Intractable headache 12/21/2011   Primary hypertension 12/21/2011    KMathis Dad PT 01/16/2021, 10:00 AM  [facility] [address]Phone: [phone]  Fax:  [phone] Name: Deborah BANNINGMRN: [id number] of Birth: [birth year]

## 2021-01-18 ENCOUNTER — Ambulatory Visit: Payer: Medicare HMO | Admitting: Physical Therapy

## 2021-01-18 ENCOUNTER — Other Ambulatory Visit: Payer: Self-pay

## 2021-01-18 ENCOUNTER — Encounter: Payer: Self-pay | Admitting: Physical Therapy

## 2021-01-18 DIAGNOSIS — R2689 Other abnormalities of gait and mobility: Secondary | ICD-10-CM

## 2021-01-18 DIAGNOSIS — M545 Low back pain, unspecified: Secondary | ICD-10-CM

## 2021-01-18 DIAGNOSIS — M542 Cervicalgia: Secondary | ICD-10-CM

## 2021-01-18 DIAGNOSIS — M6281 Muscle weakness (generalized): Secondary | ICD-10-CM

## 2021-01-18 NOTE — Therapy (Signed)
[place], [place], [address] Phone: [phone]   Fax:  [phone]  PHYSICAL THERAPY DISCHARGE SUMMARY  Visits from Start of Care: 9  Current functional level related to goals / functional outcomes: See assessment/goals   Remaining deficits: See assessment/goals   Education / Equipment: HEP and D/C plans  Patient agrees to discharge. Patient goals were met. Patient is being discharged due to meeting the stated rehab goals.   Patient Details  Name: Deborah Williamson MRN: [id number] Date of Birth: 1961/01/13 Referring Provider (PT): Mack Hook, MD   Encounter Date: 01/18/2021   PT End of Session - 01/18/21 0917     Visit Number 9    Number of Visits 16    Date for PT Re-Evaluation 02/13/21    Authorization Type [employer] MCR    Progress Note Due on Visit 10    PT Start Time 0915    PT Stop Time 0945    PT Time Calculation (min) 30 min    Activity Tolerance Patient tolerated treatment well    Behavior During Therapy [facility] for tasks assessed/performed             Past Medical History:  Diagnosis Date   Anxiety    Ascending aortic aneurysm 01/2013   3.5-4 cm noted on CT-A   Bipolar II disorder, most recent episode major depressive ([place])    Followed at [facility].  Ernestine Conrad, counselor:  [phone]   DJD (degenerative joint disease) of knee 01/02/2017   Dysrhythmia    "irregular" (02/17/2013)   Family history of breast cancer    Family history of prostate cancer    Headache(784.0)    "probably once/week" (02/17/2013)   Hyperlipidemia    Hypertension    Insomnia    Left ovarian cyst 01/2013   Incidental CT finding   Migraine    "probably once/week" (02/17/2013)   Morbid obesity ([place]) 01/02/2017   Normocytic anemia    NSTEMI (non-ST elevated myocardial infarction) ([place]) 02/16/2013   Takotsubo cardiomyopathy, normal cors / Echocardiogram 4/22: EF 60-65, no RWMA, Gr 1  DD, GLS -22.1%, normal RVSF, RVSP 34.8, trivial MR, ascending aorta 39 mm   Obesity    OCD (obsessive compulsive disorder)    Plantar fasciitis of right foot    PTSD (post-traumatic stress disorder)    PTSD (post-traumatic stress disorder)    PTSD (post-traumatic stress disorder)    Takotsubo cardiomyopathy 01/2013   a. 2D echo 02/17/13: EF 30-35%, periapical AK, normal RV size/function, moderate pulmonary HTN. c/w Takotsubo CM. ;  b.  Echo (04/2013): EF 55%, no WMA, Gr 1 DD, mildly dilated ascending aorta (39 mm), mild MR, mild BAE, PASP 33   Trichomonas vaginitis 10/2014    Past Surgical History:  Procedure Laterality Date   BRACHIOPLASTY Bilateral 05/2011   Plastic Surgery to remove loose skin after Weight Loss   CARDIAC CATHETERIZATION  02/17/2013   no angiographic evidence of CAD, EF 35%, HK or the anteroapical wall, apex and inferoapical wall   FOOT SURGERY Right    KNEE ARTHROSCOPY Right 02/05/2017   [facility]   LEFT HEART CATHETERIZATION WITH CORONARY ANGIOGRAM N/A 02/17/2013   Procedure: LEFT HEART CATHETERIZATION WITH CORONARY ANGIOGRAM;  Surgeon: Burnell Blanks, MD;  Location: [facility] CATH LAB;  Service: Cardiovascular;  Laterality: N/A;   MASS EXCISION Right 02/29/2016   Procedure: EXCISION MASS, right wrist;  Surgeon: Leanora Cover, MD;  Location: [place];  Service: Orthopedics;  Laterality: Right;  EXCISION MASS, right wrist   TOTAL KNEE ARTHROPLASTY Left 03/10/2017   Procedure: TOTAL KNEE ARTHROPLASTY;  Surgeon: Dorna Leitz, MD;  Location: [place];  Service: Orthopedics;  Laterality: Left;   TUBAL LIGATION  1989    There were no vitals filed for this visit.  30'' STS: 10x  Cervical Rotation: R: 55, L 60  L Shoulder flexion: 120 degrees   Subjective Assessment - 01/18/21 0922     Subjective Pt reports that overall she feels good about D/C.  "I'm not scared to move anymore" Pain intensity (L shoulder):  current 5/10 Aggs: hurts at rest,  shoulder motions.  Eases: heat.  Pain intensity (LBP):   current 5/10  Aggs: movement, but pain at rest.  Eases: heat, rest.    Pertinent History Significant PMH: bipolar, hx of aortic aneurism, anemia, hx of MI (2014)             FOTO: 65   [facility] Adult PT Treatment/Exercise:   Therapeutic Exercise:   - nu-step L5 56mwhile taking subjective and planning session with patient   Therapeutic Activity - collecting information for goals, checking progress, and reviewing with patient      PT Short Term Goals - 01/09/21 0921       PT SHORT TERM GOAL #1   Title Deborah Snarewill be >75% HEP compliant to improve carryover between sessions and facilitate independent management of condition    Baseline 10/18: MET    Status Achieved    Target Date 01/09/21               PT Long Term Goals - 01/18/21 0924       PT LONG TERM GOAL #1   Title Deborah Snarewill improve R cervical rotation to >/= 50 degrees  EVAL:  25 degrees with pain  target date: 02/13/21    Baseline 10/27: R: 55, L 60      PT LONG TERM GOAL #2   Title Deborah APPERSONwill improve 30'' STS (MCID 2) to >/= 7x to show improved LE strength and improved transfers  EVAL: 5x   target date: 02/13/21    Baseline 10/27: 10x      PT LONG TERM GOAL #3   Title Deborah Snarewill demonstrate >110 degrees of active ROM in flexion to allow completion of activities involving reaching [place] not limited by pain  EVAL: 65 degrees  target date: 02/13/21    Baseline 10/27: 120      PT LONG TERM GOAL #4   Title Deborah JURANwill improve FOTO score from 53 (on evaluation) to 67 as a proxy for functional improvement  target date: 02/13/21    Baseline 10/18: 46 10/27: 521   Status On-going      PT LONG TERM GOAL #5   Title Deborah Snarewill report >/= 25% decrease in pain from evaluation  EVAL: 10/10 max pain  target date: 02/13/21    Baseline 10/27: MET 5/10                    Plan - 01/18/21 0950     Clinical Impression Statement Deborah DESILVAhas progressed well with therapy.  Improved impairments include: shoulder and cervical ROM, pain, L shoulder strength.  Functional improvements include: [facility], reduced fear, ability to complete ADLs at home.  Progressions needed include: continued work at home with HEP.  Barriers to  progress include: none.  Please see baseline and/or status section in "Goals" for specific progress on short term and long term goals established at evaluation.  I recommend D/C home with HEP; pt agrees with plan.    Stability/Clinical Decision Making Stable/Uncomplicated    Rehab Potential Fair    PT Frequency 2x / week    PT Duration 8 weeks    PT Treatment/Interventions ADLs/Self Care Home Management;Aquatic Therapy;Iontophoresis 22m/ml Dexamethasone;Gait training;Therapeutic activities;Therapeutic exercise;Neuromuscular re-education;Manual techniques;Dry needling;Vasopneumatic Device;Spinal Manipulations;Joint Manipulations    PT Next Visit Plan Take FOTO and establish goal, Manual PRN, ROM for neck, pain education, information on PTSD group?, gentle strengthening, balance    PT Home Exercise Plan [id number]    Consulted and Agree with Plan of Care Patient             Patient will benefit from skilled therapeutic intervention in order to improve the following deficits and impairments:  Decreased balance, Difficulty walking, Impaired sensation, Decreased range of motion, Decreased activity tolerance, Decreased strength, Pain  Visit Diagnosis: Cervicalgia  Low back pain, unspecified back pain laterality, unspecified chronicity, unspecified whether sciatica present  Balance problem  Muscle weakness  Other abnormalities of gait and mobility     Problem List Patient Active Problem List   Diagnosis Date Noted   Genetic testing 02/29/2020   Family history of breast cancer    Family history of  prostate cancer    Pruritus 07/16/2019   Night sweats 07/16/2019   PTSD (post-traumatic stress disorder)    Plantar fasciitis of right foot    OCD (obsessive compulsive disorder)    Obesity    Migraine    Insomnia    Dysrhythmia    Bipolar II disorder, most recent episode major depressive ([place]    Anxiety    Primary osteoarthritis of left knee 03/10/2017   Morbid obesity ([place] 01/02/2017   DJD (degenerative joint disease) of knee 01/02/2017   Severe recurrent major depression without psychotic features ([place] 12/19/2015    Class: Chronic   Hyperlipidemia 10/12/2015   Varicose vein    Trichomonas vaginitis 10/24/2014   Left ovarian cyst 02/18/2013   Ascending aortic aneurysm ([place] 02/18/2013   Takotsubo cardiomyopathy 02/18/2013   NSTEMI (non-ST elevated myocardial infarction) ([place] 02/16/2013   Normocytic anemia 02/16/2013   Depression 02/25/2012   Intractable headache 12/21/2011   Primary hypertension 12/21/2011    KMathis Dad PT 01/18/2021, 9:52 AM  [facility] [address]Phone: [phone]  Fax:  [phone] Name: MNIKOLETTA VARMAMRN: [id number] of Birth: [birth year]

## 2021-01-19 ENCOUNTER — Ambulatory Visit (INDEPENDENT_AMBULATORY_CARE_PROVIDER_SITE_OTHER): Payer: Medicare HMO | Admitting: Internal Medicine

## 2021-01-19 ENCOUNTER — Ambulatory Visit
Admission: RE | Admit: 2021-01-19 | Discharge: 2021-01-19 | Disposition: A | Discharge status: Home / Self Care | Payer: Medicare HMO | Source: Ambulatory Visit | Attending: Internal Medicine | Admitting: Internal Medicine

## 2021-01-19 ENCOUNTER — Encounter: Payer: Self-pay | Admitting: Internal Medicine

## 2021-01-19 ENCOUNTER — Other Ambulatory Visit: Payer: Self-pay | Admitting: Internal Medicine

## 2021-01-19 VITALS — BP 112/82 | HR 56 | Resp 12 | Ht 66.5 in | Wt 225.5 lb

## 2021-01-19 DIAGNOSIS — S6991XD Unspecified injury of right wrist, hand and finger(s), subsequent encounter: Secondary | ICD-10-CM | POA: Diagnosis not present

## 2021-01-19 DIAGNOSIS — Z0000 Encounter for general adult medical examination without abnormal findings: Secondary | ICD-10-CM | POA: Diagnosis not present

## 2021-01-19 DIAGNOSIS — Z124 Encounter for screening for malignant neoplasm of cervix: Secondary | ICD-10-CM

## 2021-01-19 DIAGNOSIS — Z23 Encounter for immunization: Secondary | ICD-10-CM | POA: Diagnosis not present

## 2021-01-19 MED ORDER — CARVEDILOL 12.5 MG PO TABS: 12.5000 mg | ORAL_TABLET | Freq: Two times a day (BID) | ORAL | 3 refills | Status: DC

## 2021-01-19 MED ORDER — FERROUS GLUCONATE 324 (38 FE) MG PO TABS: 324.0000 mg | ORAL_TABLET | Freq: Every day | ORAL | 3 refills | Status: DC
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
## 2021-01-19 NOTE — Patient Instructions (Signed)
Please obtain influenza vaccine at your preferred pharmacy

## 2021-01-22 LAB — CYTOLOGY - PAP

## 2021-04-01 ENCOUNTER — Encounter: Payer: Self-pay | Admitting: Internal Medicine

## 2021-05-18 ENCOUNTER — Other Ambulatory Visit: Payer: Self-pay

## 2021-05-18 ENCOUNTER — Other Ambulatory Visit: Payer: Medicare HMO

## 2021-05-18 DIAGNOSIS — D649 Anemia, unspecified: Secondary | ICD-10-CM

## 2021-05-19 LAB — FOLATE: Folate: >20 ng/mL (ref >3.0)

## 2021-05-19 LAB — CBC WITH DIFFERENTIAL/PLATELET
Basophils Absolute: 0 10*3/uL (ref 0.0–0.2)
Basos: 1 %
EOS (ABSOLUTE): 0.1 10*3/uL (ref 0.0–0.4)
Eos: 3 %
Hematocrit: 33 % — ABNORMAL LOW (ref 34.0–46.6)
Hemoglobin: 11 g/dL — ABNORMAL LOW (ref 11.1–15.9)
Immature Grans (Abs): 0 10*3/uL (ref 0.0–0.1)
Immature Granulocytes: 0 %
Lymphocytes Absolute: 1.4 10*3/uL (ref 0.7–3.1)
Lymphs: 28 %
MCH: 27.6 pg (ref 26.6–33.0)
MCHC: 33.3 g/dL (ref 31.5–35.7)
MCV: 83 fL (ref 79–97)
Monocytes Absolute: 0.3 10*3/uL (ref 0.1–0.9)
Monocytes: 7 %
Neutrophils Absolute: 3 10*3/uL (ref 1.4–7.0)
Neutrophils: 61 %
Platelets: 288 10*3/uL (ref 150–450)
RBC: 3.99 x10E6/uL (ref 3.77–5.28)
RDW: 13.8 % (ref 11.7–15.4)
WBC: 4.9 10*3/uL (ref 3.4–10.8)

## 2021-05-19 LAB — IRON AND TIBC
Iron Saturation: 18 % (ref 15–55)
Iron: 36 ug/dL (ref 27–159)
Total Iron Binding Capacity: 205 ug/dL — ABNORMAL LOW (ref 250–450)
UIBC: 169 ug/dL (ref 131–425)

## 2021-05-19 LAB — VITAMIN B12: Vitamin B-12: 1211 pg/mL (ref 232–1245)

## 2021-10-08 ENCOUNTER — Other Ambulatory Visit: Payer: Self-pay | Admitting: Internal Medicine

## 2021-10-08 DIAGNOSIS — Z1231 Encounter for screening mammogram for malignant neoplasm of breast: Secondary | ICD-10-CM

## 2021-10-22 IMAGING — MG MM DIGITAL DIAGNOSTIC UNILAT*R* W/ TOMO W/ CAD
4 series · 4 of 12 positions shown · non-contrast
Comparison: Previous exam(s).

CLINICAL DATA: Patient recalled from screening for possible right
breast mass.

EXAM:
DIGITAL DIAGNOSTIC RIGHT MAMMOGRAM WITH CAD AND TOMO
ULTRASOUND RIGHT BREAST

[R CC synth-2D]
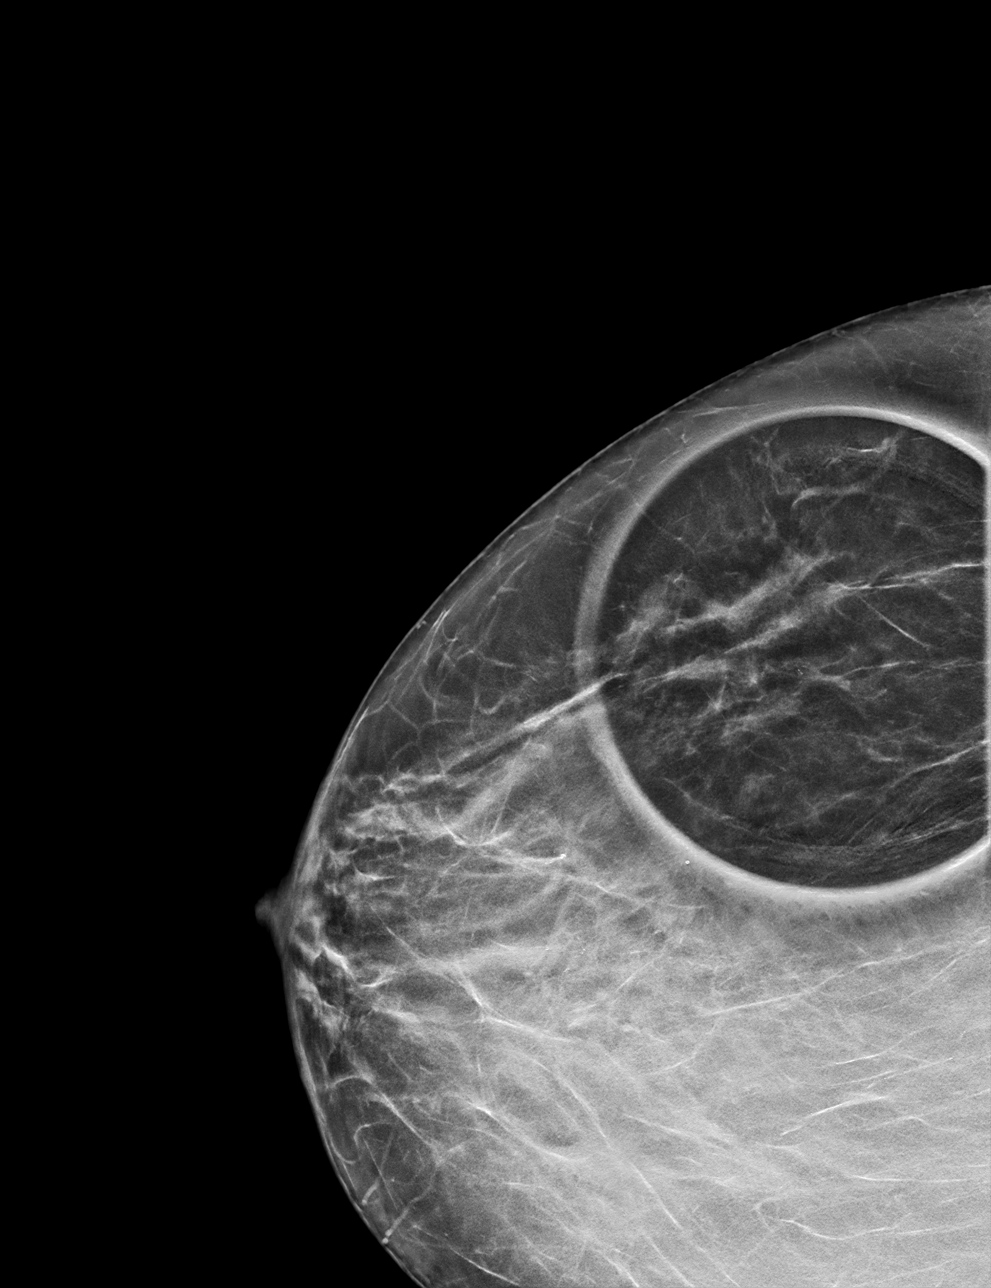

[R MLO synth-2D]
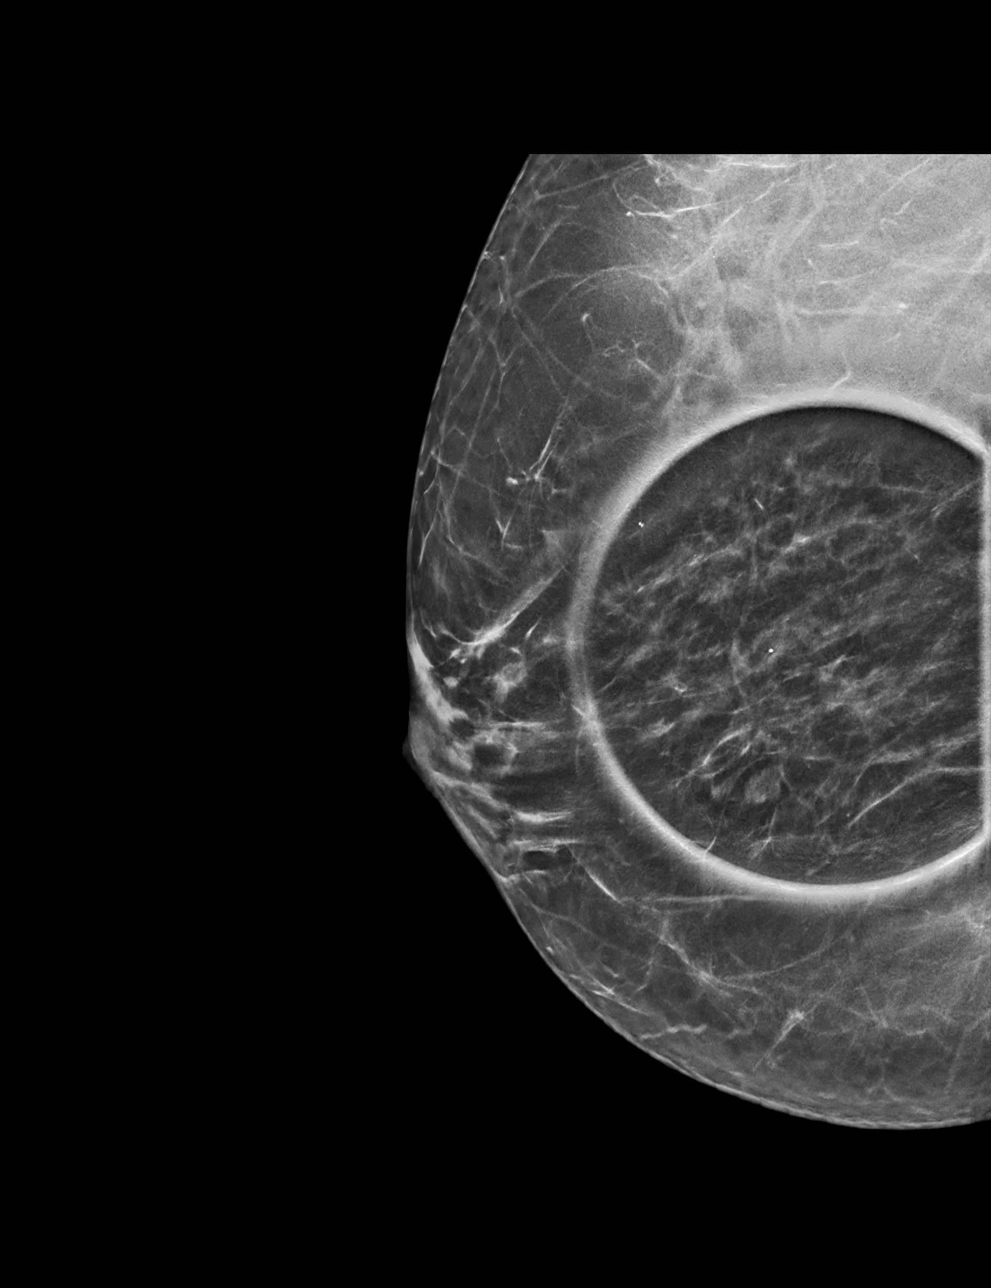

[R MLO tomo · tomo slice 33/64.0]
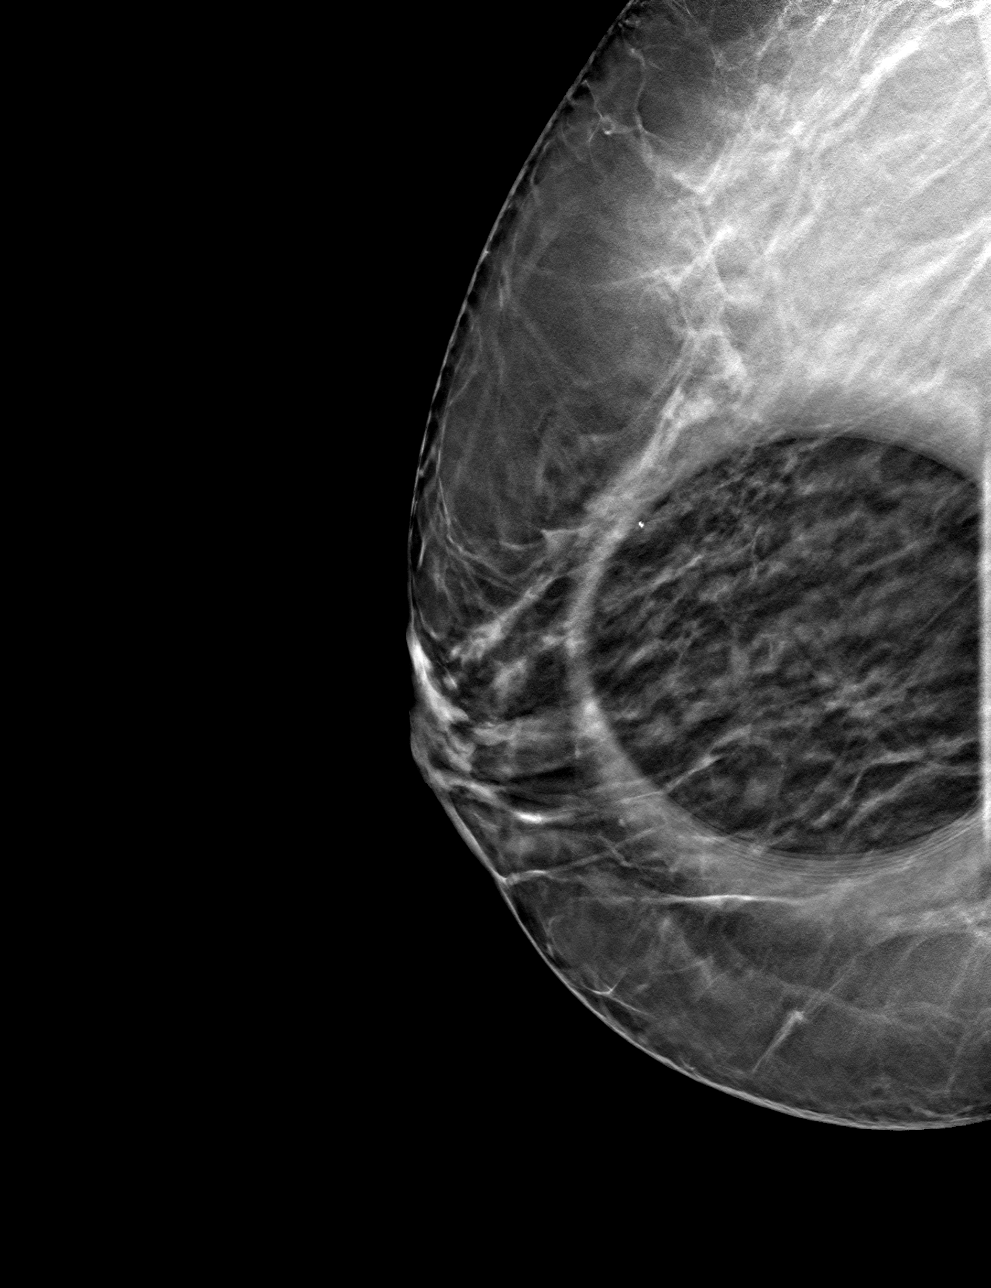

[R CC tomo · tomo slice 29/58.0]
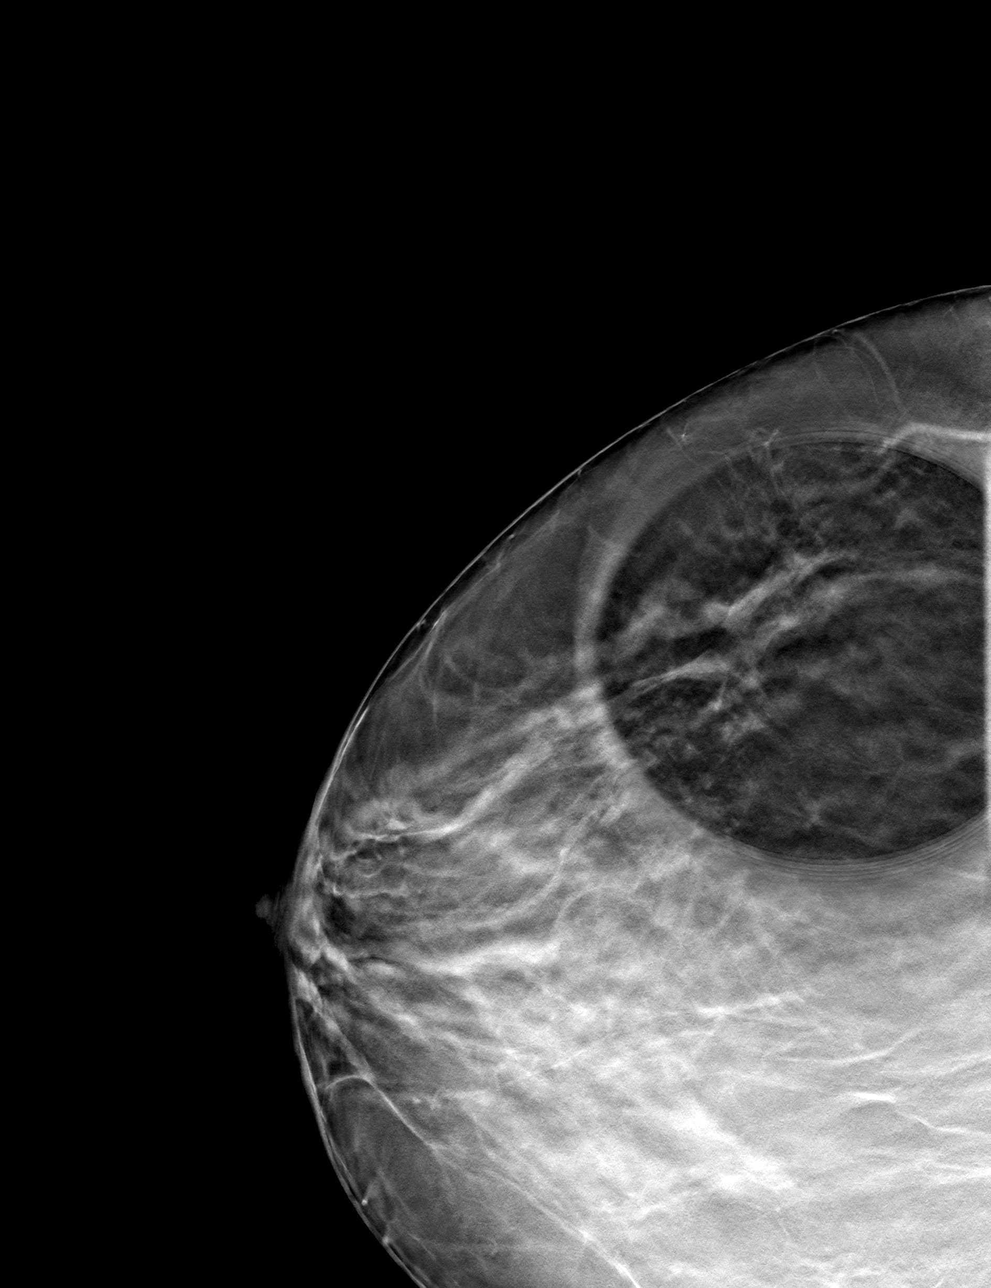

[4 of 12 positions shown; findings below may reference images not displayed]

ACR Breast Density Category c: The breast tissue is heterogeneously
dense, which may obscure small masses.
FINDINGS: Questioned mass within the outer aspect of the right breast middle
depth predominately resolved with additional imaging suggestive of
dense fibroglandular tissue. Given the residual density, ultrasound
will be performed.

Mammographic images were processed with CAD.

Targeted ultrasound is performed, showing no suspicious mass within
the outer right breast. A normal appearing intramammary lymph node
is identified.
IMPRESSION: No mammographic evidence for malignancy.

RECOMMENDATION:
Screening mammogram in one year.(Code:ZS-[id number])

I have discussed the findings and recommendations with the patient.
If applicable, a reminder letter will be sent to the patient
regarding the next appointment.

BI-RADS CATEGORY  1: Negative.

## 2021-10-29 ENCOUNTER — Telehealth: Payer: Self-pay | Admitting: Cardiovascular Disease

## 2021-10-29 ENCOUNTER — Encounter: Payer: Self-pay | Admitting: Cardiovascular Disease

## 2021-10-29 ENCOUNTER — Ambulatory Visit: Payer: Medicare HMO | Admitting: Cardiovascular Disease

## 2021-10-29 VITALS — BP 112/70 | HR 56 | Ht 66.5 in | Wt 236.0 lb

## 2021-10-29 DIAGNOSIS — I10 Essential (primary) hypertension: Secondary | ICD-10-CM | POA: Diagnosis not present

## 2021-10-29 DIAGNOSIS — I5181 Takotsubo syndrome: Secondary | ICD-10-CM | POA: Diagnosis not present

## 2021-10-29 DIAGNOSIS — Z01812 Encounter for preprocedural laboratory examination: Secondary | ICD-10-CM

## 2021-10-29 DIAGNOSIS — I712 Thoracic aortic aneurysm, without rupture, unspecified: Secondary | ICD-10-CM | POA: Diagnosis not present

## 2021-10-29 NOTE — Patient Instructions (Signed)
Medication Instructions:  No changes *If you need a refill on your cardiac medications before your next appointment, please call your pharmacy*   Lab Work: In March 2024 - before CT Chest   If you have labs (blood work) drawn today and your tests are completely normal, you will receive your results only by: MyChart Message (if you have MyChart) OR A paper copy in the mail If you have any lab test that is abnormal or we need to change your treatment, we will call you to review the results.   Testing/Procedures: Chest CT Aorta   Follow-Up: At [facility], you and your health needs are our priority.  As part of our continuing mission to provide you with exceptional heart care, we have created designated Provider Care Teams.  These Care Teams include your primary Cardiologist (physician) and Advanced Practice Providers (APPs -  Physician Assistants and Nurse Practitioners) who all work together to provide you with the care you need, when you need it.   Your next appointment:   12 month(s)  The format for your next appointment:   In Person  Provider:   Verne Carrow, MD     Other Instructions   Important Information About Sugar

## 2021-10-29 NOTE — Progress Notes (Signed)
Chief Complaint  Patient presents with   Follow-up    Non ischemic cardiomyopathy    History of Present Illness: 61 yo female with a history of thoracic aortic aneurysm, HTN and non-ischemic cardiomyopathy (stress induced CM) who is here today for cardiac follow up.  She was admitted to [facility] November 2014 with chest pain and elevated cardiac enzymes. Cardiac cath 02/16/2013 with no angiographic evidence of CAD, LVEF 35% with anteroapical, apical and inferoapical HK. Echocardiogram in 2014 with LVEF= 30-35%. grade 1 diastolic dysfunction, trivial MR. Chest/abdominal/pelvic CTA in 2014 showed no aortic dissection, mild aneurysmal dilatation of the ascending thoracic aorta, no pulmonary embolism. She was felt to have a stress induced cardiomyopathy. She was placed on a beta blocker and ACEI. Echo February 2015 with normal LV systolic function. Last echo April 2022 with LVEF=60-65%, trivial MR. Chest CTA March 2022 with stable 4.1 cm aneurysm of the ascending aorta.   She is here today for follow up. The patient denies any chest pain, dyspnea, palpitations, lower extremity edema, orthopnea, PND, dizziness, near syncope or syncope.   Primary Care Physician: Julieanne Manson, MD  Past Medical History:  Diagnosis Date   Anxiety    Ascending aortic aneurysm (HCC) 01/2013   3.5-4 cm noted on CT-A   Bipolar II disorder, most recent episode major depressive (HCC)    Follow[facility] at [facility].  Cline Crock, counselor:  [phone]   DJD (degenerative joint disease) of knee 01/02/2017   Dysrhythmia    "irregular" (02/17/2013)   Family history of breast cancer    Family history of prostate cancer    Headache(784.0)    "probably once/week" (02/17/2013)   Hyperlipidemia    Hypertension    Insomnia    Left ovarian cyst 01/2013   Incidental CT finding   Migraine    "probably once/week" (02/17/2013)   Morbid obesity (HCC) 01/02/2017   Normocytic anemia    NSTEMI (non-ST elevated myocardial  infarction) (HCC) 02/16/2013   Takotsubo cardiomyopathy, normal cors / Echocardiogram 4/22: EF 60-65, no RWMA, Gr 1 DD, GLS -22.1%, normal RVSF, RVSP 34.8, trivial MR, ascending aorta 39 mm   Obesity    OCD (obsessive compulsive disorder)    Plantar fasciitis of right foot    PTSD (post-traumatic stress disorder)    Takotsubo cardiomyopathy 01/2013   a. 2D echo 02/17/13: EF 30-35%, periapical AK, normal RV size/function, moderate pulmonary HTN. c/w Takotsubo CM. ;  b.  Echo (04/2013): EF 55%, no WMA, Gr 1 DD, mildly dilated ascending aorta (39 mm), mild MR, mild BAE, PASP 33   Trichomonas vaginitis 10/2014    Past Surgical History:  Procedure Laterality Date   BRACHIOPLASTY Bilateral 05/2011   Plastic Surgery to remove loose skin after Weight Loss   CARDIAC CATHETERIZATION  02/17/2013   no angiographic evidence of CAD, EF 35%, HK or the anteroapical wall, apex and inferoapical wall   FOOT SURGERY Right    KNEE ARTHROSCOPY Right 02/05/2017   [facility]   LEFT HEART CATHETERIZATION WITH CORONARY ANGIOGRAM N/A 02/17/2013   Procedure: LEFT HEART CATHETERIZATION WITH CORONARY ANGIOGRAM;  Surgeon: Kathleene Hazel, MD;  Location: [facility] CATH LAB;  Service: Cardiovascular;  Laterality: N/A;   MASS EXCISION Right 02/29/2016   Procedure: EXCISION MASS, right wrist;  Surgeon: Betha Loa, MD;  Location: [facility];  Service: Orthopedics;  Laterality: Right;  EXCISION MASS, right wrist   TOTAL KNEE ARTHROPLASTY Left 03/10/2017   Procedure: TOTAL KNEE ARTHROPLASTY;  Surgeon: Jodi Geralds,  MD;  Location: [facility] OR;  Service: Orthopedics;  Laterality: Left;   TUBAL LIGATION  1989    Current Outpatient Medications  Medication Sig Dispense Refill   acetaminophen (TYLENOL) 500 MG tablet Take 1,000 mg by mouth 2 (two) times daily as needed for moderate pain or headache.     amLODipine (NORVASC) 5 MG tablet Take 1 tablet (5 mg total) by mouth daily. 90 tablet 3   Ascorbic Acid  (VITAMIN C ADULT GUMMIES PO) Take by mouth. 2 daily     aspirin EC 81 MG tablet Take 81 mg by mouth daily.     busPIRone (BUSPAR) 15 MG tablet 15 mg 3 (three) times daily.      carvedilol (COREG) 12.5 MG tablet Take 1 tablet (12.5 mg total) by mouth 2 (two) times daily. 180 tablet 3   ferrous gluconate (FERGON) 324 MG tablet Take 1 tablet (324 mg total) by mouth daily with breakfast.  3   FIBER ADULT GUMMIES PO Take by mouth. 2 daily     hydrochlorothiazide (MICROZIDE) 12.5 MG capsule Take 1 capsule (12.5 mg total) by mouth daily. 90 capsule 3   losartan (COZAAR) 100 MG tablet TAKE 1 TABLET EVERY DAY 90 tablet 3   Multiple Vitamin (MULTIVITAMIN WITH MINERALS) TABS tablet Take 1 tablet by mouth daily.     Multiple Vitamins-Minerals (HAIR SKIN AND NAILS FORMULA) TABS Take 1 tablet by mouth daily.     OVER THE COUNTER MEDICATION 2 (two) times daily. Apple cider vinegar gummy     rosuvastatin (CRESTOR) 10 MG tablet TAKE 1 TABLET EVERY DAY 90 tablet 3   tiZANidine (ZANAFLEX) 4 MG tablet Take 4 mg by mouth every 8 (eight) hours as needed for muscle spasms.     No current facility-administered medications for this visit.    Allergies  Allergen Reactions   Lisinopril Other (See Comments)    Dry cough   Vicodin [Hydrocodone-Acetaminophen] Nausea Only    Social History   Socioeconomic History   Marital status: Legally Separated    Spouse name: Not on file   Number of children: 2   Years of education: Not on file   Highest education level: Bachelor's degree (e.g., BA, AB, BS)  Occupational History   Occupation: unemployed/disability  Tobacco Use   Smoking status: Never   Smokeless tobacco: Never  Vaping Use   Vaping Use: Never used  Substance and Sexual Activity   Alcohol use: No   Drug use: No   Sexual activity: Not Currently  Other Topics Concern   Not on file  Social History Narrative   Lives alone   Prior to health issues, 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[employer]   Social Determinants of Health   Financial Resource Strain: Low Risk  (01/19/2021)   Overall Financial Resource Strain (CARDIA)    Difficulty of Paying Living Expenses: Not hard at all  Food Insecurity: No Food Insecurity (01/19/2021)   Hunger Vital Sign    Worried About Running Out of Food in the Last Year: Never true    Ran Out of Food in the Last Year: Never true  Transportation Needs: No Transportation Needs (01/19/2021)   PRAPARE - Administrator, Civil Service (Medical): No    Lack of Transportation (Non-Medical): No  Physical Activity: Not on file  Stress: Not on file  Social Connections: Not on file  Intimate Partner Violence: Not on file    Family History  Problem Relation Age  of Onset   Breast cancer Mother 33       Recurrence at 2 in opposite breast   Depression Sister    Obesity Daughter    Breast cancer Maternal Aunt 50   Breast cancer Maternal Aunt 55   Prostate cancer Maternal Uncle 81   Prostate cancer Maternal Uncle 81   Heart attack Maternal Grandmother    Other Maternal Grandfather 25       old age   Diabetes Paternal Grandmother    Stroke Paternal Grandmother    Breast cancer Cousin 61       Daughter of maternal aunt with breast cancer   Breast cancer Cousin 1   Breast cancer Half-Sister 63       Breast cancer    Review of Systems:  As stated in the HPI and otherwise negative.   BP 112/70   Pulse (!) 56   Ht 5' 6.5" (1.689 m)   Wt 236 lb (107 kg)   LMP 03/16/2017   SpO2 98%   BMI 37.52 kg/m   Physical Examination:  General: Well developed, well nourished, NAD  HEENT: OP clear, mucus membranes moist  SKIN: warm, dry. No rashes. Neuro: No focal deficits  Musculoskeletal: Muscle strength 5/5 all ext  Psychiatric: Mood and affect normal  Neck: No JVD, no carotid bruits, no thyromegaly, no lymphadenopathy.  Lungs:Clear bilaterally, no wheezes, rhonci, crackles Cardiovascular: Regular rate and rhythm. No murmurs, gallops  or rubs. Abdomen:Soft. Bowel sounds present. Non-tender.  Extremities: No lower extremity edema. Pulses are 2 + in the bilateral DP/PT.  Echo April 2022:   1. Left ventricular ejection fraction, by estimation, is 60 to 65%. The  left ventricle has normal function. The left ventricle has no regional  wall motion abnormalities. Left ventricular diastolic parameters are  consistent with Grade I diastolic  dysfunction (impaired relaxation). The average left ventricular global  longitudinal strain is -22.1 %. The global longitudinal strain is normal.   2. Right ventricular systolic function is normal. The right ventricular  size is normal. There is normal pulmonary artery systolic pressure. The  estimated right ventricular systolic pressure is 34.8 mmHg.   3. The mitral valve is normal in structure. Trivial mitral valve  regurgitation. No evidence of mitral stenosis.   4. The aortic valve is tricuspid. Aortic valve regurgitation is not  visualized. No aortic stenosis is present.   5. Aortic dilatation noted. There is mild dilatation of the ascending  aorta, measuring 39 mm.   6. The inferior vena cava is normal in size with greater than 50%  respiratory variability, suggesting right atrial pressure of 3 mmHg.   EKG:  EKG is ordered today. This demonstrates Sinus brady, rate 56 bpm  Recent Labs: 01/15/2021: ALT 13; BUN 22; Creatinine, Ser 0.79; Potassium 3.7; Sodium 139 05/18/2021: Hemoglobin 11.0; Platelets 288   Lipid Panel    Component Value Date/Time   CHOL 126 01/15/2021 0835   TRIG 45 01/15/2021 0835   HDL 47 01/15/2021 0835   CHOLHDL 3 05/07/2013 1057   VLDL 6.0 05/07/2013 1057   LDLCALC 68 01/15/2021 0835     Wt Readings from Last 3 Encounters:  10/29/21 236 lb (107 kg)  01/19/21 225 lb 8 oz (102.3 kg)  11/21/20 223 lb (101.2 kg)     Assessment and Plan:   1. Non-ischemic cardiomyopathy: Likely Takotsubo cardiomyopathy given presentation in November 2014. Her LV  function has returned to normal. Echo April 2022 with LVEF=60-65%. Continue Cozaar and Coreg.  2. HTN: BP controlled. No changes today  3. Thoracic aortic aneurysm: Stable mild dilatation of the ascending aorta at 4.1 cm in March 2022. Will repeat CTA every 2 years given stability over time. Will arrange chest CTA for March 2024.   Labs/ tests ordered today include:   Orders Placed This Encounter  Procedures   CT ANGIO CHEST AORTA W/CM & OR WO/CM   Basic metabolic panel   EKG 12-Lead   Disposition:   F/U with me in 12 months  Signed, Verne Carrow, MD 10/29/2021 11:39 AM    [facility] [address] [place], [place], [place]  [address] Phone: [phone]; Fax: [phone]

## 2021-10-29 NOTE — Telephone Encounter (Signed)
Patient stated she is claustrophic and will need to be medicated for Ct in March 2024. Please help patient with this. Thanks in advance!

## 2021-11-09 ENCOUNTER — Other Ambulatory Visit: Payer: Self-pay | Admitting: Cardiovascular Disease

## 2021-11-09 ENCOUNTER — Other Ambulatory Visit: Payer: Self-pay | Admitting: Physician Assistant

## 2021-11-09 ENCOUNTER — Ambulatory Visit
Admission: RE | Admit: 2021-11-09 | Discharge: 2021-11-09 | Disposition: A | Discharge status: Home / Self Care | Payer: Medicare HMO | Source: Ambulatory Visit | Attending: Internal Medicine | Admitting: Internal Medicine

## 2021-11-09 DIAGNOSIS — Z1231 Encounter for screening mammogram for malignant neoplasm of breast: Secondary | ICD-10-CM

## 2021-11-09 MED ORDER — ROSUVASTATIN CALCIUM 10 MG PO TABS: 10.0000 mg | ORAL_TABLET | Freq: Every day | ORAL | 3 refills | Status: DC

## 2021-11-09 MED ORDER — LOSARTAN POTASSIUM 100 MG PO TABS: 100.0000 mg | ORAL_TABLET | Freq: Every day | ORAL | 3 refills | Status: DC

## 2022-01-18 ENCOUNTER — Other Ambulatory Visit (INDEPENDENT_AMBULATORY_CARE_PROVIDER_SITE_OTHER): Payer: Medicare HMO

## 2022-01-18 DIAGNOSIS — Z79899 Other long term (current) drug therapy: Secondary | ICD-10-CM

## 2022-01-18 DIAGNOSIS — D649 Anemia, unspecified: Secondary | ICD-10-CM | POA: Diagnosis not present

## 2022-01-19 LAB — CBC WITH DIFFERENTIAL/PLATELET
Basophils Absolute: 0 10*3/uL (ref 0.0–0.2)
Basos: 1 %
EOS (ABSOLUTE): 0.2 10*3/uL (ref 0.0–0.4)
Eos: 4 %
Hematocrit: 34.9 % (ref 34.0–46.6)
Hemoglobin: 11.1 g/dL (ref 11.1–15.9)
Immature Grans (Abs): 0 10*3/uL (ref 0.0–0.1)
Immature Granulocytes: 0 %
Lymphocytes Absolute: 1.6 10*3/uL (ref 0.7–3.1)
Lymphs: 43 %
MCH: 26.9 pg (ref 26.6–33.0)
MCHC: 31.8 g/dL (ref 31.5–35.7)
MCV: 85 fL (ref 79–97)
Monocytes Absolute: 0.3 10*3/uL (ref 0.1–0.9)
Monocytes: 7 %
Neutrophils Absolute: 1.7 10*3/uL (ref 1.4–7.0)
Neutrophils: 45 %
Platelets: 253 10*3/uL (ref 150–450)
RBC: 4.13 x10E6/uL (ref 3.77–5.28)
RDW: 13.1 % (ref 11.7–15.4)
WBC: 3.7 10*3/uL (ref 3.4–10.8)

## 2022-01-19 LAB — LIPID PANEL W/O CHOL/HDL RATIO
Cholesterol, Total: 121 mg/dL (ref 100–199)
HDL: 48 mg/dL (ref >39)
LDL Chol Calc (NIH): 61 mg/dL (ref 0–99)
Triglycerides: 52 mg/dL (ref 0–149)
VLDL Cholesterol Cal: 12 mg/dL (ref 5–40)

## 2022-01-19 LAB — COMPREHENSIVE METABOLIC PANEL
ALT: 13 IU/L (ref 0–32)
AST: 19 IU/L (ref 0–40)
Albumin/Globulin Ratio: 1.4 (ref 1.2–2.2)
Albumin: 3.8 g/dL (ref 3.8–4.9)
Alkaline Phosphatase: 89 IU/L (ref 44–121)
BUN/Creatinine Ratio: 18 (ref 12–28)
BUN: 14 mg/dL (ref 8–27)
Bilirubin Total: 0.4 mg/dL (ref 0.0–1.2)
CO2: 22 mmol/L (ref 20–29)
Calcium: 8.9 mg/dL (ref 8.7–10.3)
Chloride: 102 mmol/L (ref 96–106)
Creatinine, Ser: 0.79 mg/dL (ref 0.57–1.00)
Globulin, Total: 2.7 g/dL (ref 1.5–4.5)
Sodium: 141 mmol/L (ref 134–144)
Total Protein: 6.5 g/dL (ref 6.0–8.5)
eGFR: 86 mL/min/{1.73_m2} (ref >59)

## 2022-01-19 LAB — IRON AND TIBC
Iron Saturation: 16 % (ref 15–55)
Iron: 37 ug/dL (ref 27–159)
Total Iron Binding Capacity: 227 ug/dL — ABNORMAL LOW (ref 250–450)
UIBC: 190 ug/dL (ref 131–425)

## 2022-01-22 ENCOUNTER — Other Ambulatory Visit: Payer: Self-pay | Admitting: Internal Medicine

## 2022-01-22 ENCOUNTER — Ambulatory Visit: Payer: Medicare HMO | Admitting: Internal Medicine

## 2022-01-22 ENCOUNTER — Encounter: Payer: Self-pay | Admitting: Internal Medicine

## 2022-01-22 VITALS — BP 160/90 | HR 60 | Resp 12 | Ht 66.5 in | Wt 236.0 lb

## 2022-01-22 DIAGNOSIS — I10 Essential (primary) hypertension: Secondary | ICD-10-CM

## 2022-01-22 DIAGNOSIS — Z780 Asymptomatic menopausal state: Secondary | ICD-10-CM | POA: Diagnosis not present

## 2022-01-22 DIAGNOSIS — Z0000 Encounter for general adult medical examination without abnormal findings: Secondary | ICD-10-CM

## 2022-01-22 DIAGNOSIS — M25561 Pain in right knee: Secondary | ICD-10-CM | POA: Diagnosis not present

## 2022-01-22 DIAGNOSIS — G8929 Other chronic pain: Secondary | ICD-10-CM

## 2022-01-22 NOTE — Progress Notes (Signed)
Subjective:    Patient ID: Deborah Williamson, female   DOB: 30-Sep-1960, 61 y.o.   MRN: [id number]   HPI  CPE without pap  1.  Pap: ASCUS with negative HPV last year.   2.  Mammogram:  Last performed 11/09/2021 and normal.  Mother with history of breast cancer, first diagnosed in her 43s with 2 recurrences, the last was at the age of 56 and currently living.  ?recurrence in lymph nodes as had mastectomy with first round.    3.  Osteoprevention:  Drinks premier every morning, but did not drink the milk as discussed.  Willing to drink 2 cups of milk daily with the premier drink.  Walks 3 times daily with her dog.    4.  Guaiac Cards/FIT:  Never.    5.  Colonoscopy:  Last 02/2020 with Dr. Marca Ancona of [facility].  Normal.  History of adenomatous polyps previously.  To follow up in 5 years (2026).  No family history of colon cancer.    6.  Immunizations:  Received RSV and new Pfizer Covid today at [facility]. Needs 2nd shingrix.   Immunization History  Administered Date(s) Administered   COVID-19, mRNA, vaccine(Comirnaty)12 years and older 01/22/2022   Influenza Inj Mdck Quad Pf 01/02/2017   Influenza Inj Mdck Quad With Preservative 01/14/2020   Influenza-Unspecified 02/10/2017, 11/21/2021   Moderna Covid-19 Vaccine Bivalent Booster 29yrs & up 01/19/2021   Moderna Sars-Covid-2 Vaccination 05/20/2019, 06/22/2019, 01/17/2020, 10/13/2020   Pneumococcal Polysaccharide-23 01/15/2019   Rsv, Bivalent, Protein Subunit Rsvpref,pf Verdis Frederickson) 01/22/2022   Td 01/14/2020   Tdap 08/29/2008   Zoster Recombinat (Shingrix) 01/19/2021     7.  Glucose/Cholesterol:  Glucose did not result due to serum contact with blood cells in tube.  History of mild elevation with normal A1C at 5.6%.  Cholesterol panel excellent. Lipid Panel     Component Value Date/Time   CHOL 121 01/18/2022 0957   TRIG 52 01/18/2022 0957   HDL 48 01/18/2022 0957   CHOLHDL 3 05/07/2013 1057   VLDL 6.0 05/07/2013 1057   LDLCALC 61  01/18/2022 0957   LABVLDL 12 01/18/2022 0957      Current Meds  Medication Sig   acetaminophen (TYLENOL) 500 MG tablet Take 1,000 mg by mouth 2 (two) times daily as needed for moderate pain or headache.   amLODipine (NORVASC) 5 MG tablet Take 1 tablet (5 mg total) by mouth daily.   Ascorbic Acid (VITAMIN C ADULT GUMMIES PO) Take by mouth. 2 daily   aspirin EC 81 MG tablet Take 81 mg by mouth daily.   busPIRone (BUSPAR) 15 MG tablet 15 mg 3 (three) times daily.    carvedilol (COREG) 12.5 MG tablet Take 1 tablet (12.5 mg total) by mouth 2 (two) times daily.   FIBER ADULT GUMMIES PO Take by mouth. 2 daily   hydrochlorothiazide (MICROZIDE) 12.5 MG capsule TAKE 1 CAPSULE EVERY DAY   LITHIUM CARBONATE PO Take 300 mg by mouth daily.   losartan (COZAAR) 100 MG tablet TAKE 1 TABLET EVERY DAY   Multiple Vitamin (MULTIVITAMIN WITH MINERALS) TABS tablet Take 1 tablet by mouth daily.   Multiple Vitamins-Minerals (HAIR SKIN AND NAILS FORMULA) TABS Take 1 tablet by mouth daily.   OVER THE COUNTER MEDICATION 2 (two) times daily. Apple cider vinegar gummy   rosuvastatin (CRESTOR) 10 MG tablet TAKE 1 TABLET EVERY DAY   tiZANidine (ZANAFLEX) 4 MG tablet Take 4 mg by mouth every 8 (eight) hours as needed for muscle spasms.  Allergies  Allergen Reactions   Lisinopril Other (See Comments)    Dry cough   Vicodin [Hydrocodone-Acetaminophen] Nausea Only   Past Medical History:  Diagnosis Date   Anxiety    Ascending aortic aneurysm (HCC) 01/2013   3.5-4 cm noted on CT-A   Bipolar II disorder, most recent episode major depressive (HCC)    Followed at [facility].  Cline Crock, counselor:  [phone]   DJD (degenerative joint disease) of knee 01/02/2017   Dysrhythmia    "irregular" (02/17/2013)   Family history of breast cancer    Family history of prostate cancer    Headache(784.0)    "probably once/week" (02/17/2013)   Hyperlipidemia    Hypertension    Insomnia    Left ovarian cyst 01/2013    Incidental CT finding   Migraine    "probably once/week" (02/17/2013)   Morbid obesity (HCC) 01/02/2017   Normocytic anemia    NSTEMI (non-ST elevated myocardial infarction) (HCC) 02/16/2013   Takotsubo cardiomyopathy, normal cors / Echocardiogram 4/22: EF 60-65, no RWMA, Gr 1 DD, GLS -22.1%, normal RVSF, RVSP 34.8, trivial MR, ascending aorta 39 mm   Obesity    OCD (obsessive compulsive disorder)    Plantar fasciitis of right foot    PTSD (post-traumatic stress disorder)    Takotsubo cardiomyopathy 01/2013   a. 2D echo 02/17/13: EF 30-35%, periapical AK, normal RV size/function, moderate pulmonary HTN. c/w Takotsubo CM. ;  b.  Echo (04/2013): EF 55%, no WMA, Gr 1 DD, mildly dilated ascending aorta (39 mm), mild MR, mild BAE, PASP 33   Trichomonas vaginitis 10/2014   Past Surgical History:  Procedure Laterality Date   BRACHIOPLASTY Bilateral 05/2011   Plastic Surgery to remove loose skin after Weight Loss   CARDIAC CATHETERIZATION  02/17/2013   no angiographic evidence of CAD, EF 35%, HK or the anteroapical wall, apex and inferoapical wall   FOOT SURGERY Right    KNEE ARTHROSCOPY Right 02/05/2017   [facility]   LEFT HEART CATHETERIZATION WITH CORONARY ANGIOGRAM N/A 02/17/2013   Procedure: LEFT HEART CATHETERIZATION WITH CORONARY ANGIOGRAM;  Surgeon: Kathleene Hazel, MD;  Location: [facility] CATH LAB;  Service: Cardiovascular;  Laterality: N/A;   MASS EXCISION Right 02/29/2016   Procedure: EXCISION MASS, right wrist;  Surgeon: Betha Loa, MD;  Location: [facility];  Service: Orthopedics;  Laterality: Right;  EXCISION MASS, right wrist   TOTAL KNEE ARTHROPLASTY Left 03/10/2017   Procedure: TOTAL KNEE ARTHROPLASTY;  Surgeon: Jodi Geralds, MD;  Location: [facility];  Service: Orthopedics;  Laterality: Left;   TUBAL LIGATION  1989   Social History   Socioeconomic History   Marital status: Legally Separated    Spouse name: Not on file   Number of children: 2   Years  of education: Not on file   Highest education level: Bachelor's degree (e.g., BA, AB, BS)  Occupational History   Occupation: unemployed/disability  Tobacco Use   Smoking status: Never   Smokeless tobacco: Never  Vaping Use   Vaping status: Never Used  Substance and Sexual Activity   Alcohol use: No   Drug use: No   Sexual activity: Not Currently  Other Topics Concern   Not on file  Social History Narrative   Lives alone with her dog, Millie   Prior to health issues, 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[employer]   Social Determinants of Health   Financial Resource Strain: Low Risk  (01/22/2022)   Overall Physicist, medical  Strain (CARDIA)    Difficulty of Paying Living Expenses: Not hard at all  Food Insecurity: No Food Insecurity (01/22/2022)   Hunger Vital Sign    Worried About Running Out of Food in the Last Year: Never true    Ran Out of Food in the Last Year: Never true  Transportation Needs: No Transportation Needs (01/19/2021)   PRAPARE - Administrator, Civil Service (Medical): No    Lack of Transportation (Non-Medical): No  Physical Activity: Not on file  Stress: Not on file  Social Connections: Not on file  Intimate Partner Violence: Not At Risk (01/22/2022)   Humiliation, Afraid, Rape, and Kick questionnaire    Fear of Current or Ex-Partner: No    Emotionally Abused: No    Physically Abused: No    Sexually Abused: No     Review of Systems  HENT:  Negative for dental problem Engineer, 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   Respiratory:  Negative for shortness of breath.   Cardiovascular:  Negative for chest pain, palpitations and leg swelling.  Gastrointestinal:  Negative for abdominal pain, blood in stool (No melena), constipation and diarrhea.  Genitourinary:  Negative for vaginal bleeding (Stopped periods last year.) and vaginal discharge.  Musculoskeletal:  Positive for arthralgias (knees hurt, especially with temp change.).  Skin:  Negative for  rash.  Neurological:  Negative for weakness and numbness.  Psychiatric/Behavioral:  Negative for dysphoric mood. The patient is not nervous/anxious.       Objective:   BP (!) 160/90 (BP Location: Right Arm, Patient Position: Sitting, Cuff Size: Normal)   Pulse 60   Resp 12   Ht 5' 6.5" (1.689 m)   Wt 236 lb (107 kg)   LMP 03/16/2017   BMI 37.52 kg/m   Physical Exam Constitutional:      Appearance: She is obese.  HENT:     Head: Normocephalic and atraumatic.     Right Ear: Tympanic membrane, ear canal and external ear normal.     Left Ear: Tympanic membrane, ear canal and external ear normal.     Nose: Nose normal.     Mouth/Throat:     Mouth: Mucous membranes are moist.     Pharynx: Oropharynx is clear.  Eyes:     Extraocular Movements: Extraocular movements intact.     Conjunctiva/sclera: Conjunctivae normal.     Pupils: Pupils are equal, round, and reactive to light.     Comments: Discs sharp  Neck:     Thyroid: No thyroid mass or thyromegaly.  Cardiovascular:     Rate and Rhythm: Normal rate and regular rhythm.     Heart sounds: S1 normal and S2 normal. No murmur heard.    No friction rub. No S3 or S4 sounds.     Comments: No carotid bruits.  Carotid, radial, femoral, DP and PT pulses normal and equal.   Pulmonary:     Effort: Pulmonary effort is normal.     Breath sounds: Normal breath sounds and air entry.  Chest:  Breasts:    Right: No inverted nipple, mass or nipple discharge.     Left: No inverted nipple, mass or nipple discharge.  Abdominal:     General: Bowel sounds are normal.     Palpations: Abdomen is soft. There is no hepatomegaly, splenomegaly or mass.     Tenderness: There is no abdominal tenderness.     Hernia: No hernia is present.  Genitourinary:    Comments: Normal external female genitalia No uterine or adnexal  mass or tenderness Musculoskeletal:        General: Normal range of motion.     Cervical back: Normal range of motion and neck  supple.     Right lower leg: No edema.     Left lower leg: No edema.  Feet:     Right foot:     Skin integrity: Skin integrity normal.     Toenail Condition: Right toenails are normal.     Left foot:     Skin integrity: Skin integrity normal.     Toenail Condition: Left toenails are normal.  Lymphadenopathy:     Head:     Right side of head: No submental or submandibular adenopathy.     Left side of head: No submental or submandibular adenopathy.     Cervical: No cervical adenopathy.     Upper Body:     Right upper body: No supraclavicular or axillary adenopathy.     Left upper body: No supraclavicular or axillary adenopathy.     Lower Body: No right inguinal adenopathy. No left inguinal adenopathy.  Skin:    General: Skin is warm.     Capillary Refill: Capillary refill takes less than 2 seconds.     Findings: Lesion (Seborrheic keratoses on trunk) present.  Neurological:     General: No focal deficit present.     Mental Status: She is alert and oriented to person, place, and time.     Cranial Nerves: Cranial nerves 2-12 are intact.     Sensory: Sensation is intact.     Motor: Motor function is intact.     Coordination: Coordination is intact.     Gait: Gait is intact.     Deep Tendon Reflexes: Reflexes are normal and symmetric.  Psychiatric:        Mood and Affect: Mood normal.        Behavior: Behavior normal. Behavior is cooperative.      Assessment & Plan   CPE without pap Mammogram normal for year Labs fine Shingrix #2. When follows up in 2 weeks. Bone density.  2.  Hx of adenomatous polyp, though last colonoscopy 2021 with no findings of polyps.   Repeat colonoscopy in 2026 with Dr. Marca Ancona  3.  Hypertension:  high today.  To return in 2 weeks for repeat BP  4.  Right knee pain: encouraged gradually decreasing space to pedals with recumbent bike or rowing machine.  Speak with ortho and let them know.  5.  Ascending AA:  followed regularly by Cardiology.

## 2022-01-30 ENCOUNTER — Other Ambulatory Visit: Payer: Self-pay | Admitting: Internal Medicine

## 2022-01-30 ENCOUNTER — Other Ambulatory Visit: Payer: Self-pay | Admitting: Physician Assistant

## 2022-02-07 ENCOUNTER — Other Ambulatory Visit (INDEPENDENT_AMBULATORY_CARE_PROVIDER_SITE_OTHER): Payer: Medicare HMO | Admitting: Internal Medicine

## 2022-02-07 VITALS — BP 126/78 | HR 68

## 2022-02-07 DIAGNOSIS — Z0130 Encounter for examination of blood pressure without abnormal findings: Secondary | ICD-10-CM

## 2022-02-07 DIAGNOSIS — Z23 Encounter for immunization: Secondary | ICD-10-CM

## 2022-02-08 NOTE — Progress Notes (Signed)
After reporting bp to Dr Mulberry, no changes to medication will be made  

## 2022-04-29 MED ORDER — DIAZEPAM 5 MG PO TABS: ORAL_TABLET | ORAL | 0 refills | Status: DC

## 2022-04-29 NOTE — Telephone Encounter (Signed)
Called patient and confirmed she has her CT scheduled and that per Dr. Angelena Form okay to send Valium 5 mg tablet one hr prior to CT.  Pt aware she will need a driver for procedure to and from.  Pt appreciative for assistance.

## 2022-05-27 ENCOUNTER — Ambulatory Visit: Payer: Medicare HMO | Attending: Cardiovascular Disease

## 2022-05-27 DIAGNOSIS — I712 Thoracic aortic aneurysm, without rupture, unspecified: Secondary | ICD-10-CM

## 2022-05-27 DIAGNOSIS — Z01812 Encounter for preprocedural laboratory examination: Secondary | ICD-10-CM

## 2022-05-27 LAB — BASIC METABOLIC PANEL
BUN/Creatinine Ratio: 29 — ABNORMAL HIGH (ref 12–28)
BUN: 24 mg/dL (ref 8–27)
CO2: 20 mmol/L (ref 20–29)
Calcium: 9.1 mg/dL (ref 8.7–10.3)
Chloride: 105 mmol/L (ref 96–106)
Creatinine, Ser: 0.84 mg/dL (ref 0.57–1.00)
Glucose: 109 mg/dL — ABNORMAL HIGH (ref 70–99)
Potassium: 4.3 mmol/L (ref 3.5–5.2)
Sodium: 138 mmol/L (ref 134–144)
eGFR: 80 mL/min/{1.73_m2} (ref >59)

## 2022-05-29 ENCOUNTER — Telehealth: Payer: Self-pay | Admitting: Cardiovascular Disease

## 2022-05-29 NOTE — Telephone Encounter (Signed)
Patient wants to know how necessary the CT angio is. She states she was told today that she will have to pay $173 for the test. She is schedule for tomorrow 3/7 at 10am.

## 2022-05-30 ENCOUNTER — Ambulatory Visit (HOSPITAL_COMMUNITY)
Admission: RE | Admit: 2022-05-30 | Discharge: 2022-05-30 | Disposition: A | Discharge status: Home / Self Care | Payer: Medicare HMO | Source: Ambulatory Visit | Attending: Cardiovascular Disease | Admitting: Cardiovascular Disease

## 2022-05-30 DIAGNOSIS — I712 Thoracic aortic aneurysm, without rupture, unspecified: Secondary | ICD-10-CM | POA: Insufficient documentation

## 2022-05-30 MED ORDER — IOHEXOL 350 MG/ML SOLN
100.0000 mL | Freq: Once | INTRAVENOUS | Status: AC | PRN
  Administered 2022-05-30: 100 mL via INTRAVENOUS

## 2022-05-30 NOTE — Telephone Encounter (Signed)
Patient appeared for CT today.  Will call with results once reviewed by MD.

## 2022-07-24 ENCOUNTER — Ambulatory Visit: Payer: Medicare HMO | Admitting: Internal Medicine

## 2022-08-13 ENCOUNTER — Other Ambulatory Visit: Payer: Self-pay | Admitting: Cardiovascular Disease

## 2022-10-10 ENCOUNTER — Other Ambulatory Visit: Payer: Self-pay | Admitting: Internal Medicine

## 2022-10-10 DIAGNOSIS — Z1231 Encounter for screening mammogram for malignant neoplasm of breast: Secondary | ICD-10-CM

## 2022-11-15 ENCOUNTER — Ambulatory Visit
Admission: RE | Admit: 2022-11-15 | Discharge: 2022-11-15 | Disposition: A | Discharge status: Home / Self Care | Payer: Medicare HMO | Source: Ambulatory Visit | Attending: Internal Medicine | Admitting: Internal Medicine

## 2022-11-15 DIAGNOSIS — Z1231 Encounter for screening mammogram for malignant neoplasm of breast: Secondary | ICD-10-CM

## 2023-01-01 ENCOUNTER — Other Ambulatory Visit: Payer: Self-pay | Admitting: Cardiovascular Disease

## 2023-01-06 ENCOUNTER — Other Ambulatory Visit: Payer: Self-pay | Admitting: Cardiovascular Disease

## 2023-01-06 ENCOUNTER — Other Ambulatory Visit: Payer: Self-pay | Admitting: Internal Medicine

## 2023-01-09 NOTE — Progress Notes (Signed)
Cardiology Office Note:    Date:  01/10/2023  ID:  Deborah Williamson, DOB August 27, 1960, MRN [id number] PCP: Julieanne Manson, MD   HeartCare Providers Cardiologist:  Verne Carrow, MD       Patient Profile:      Thoracic aortic aneurysm CT 05/30/22: 40 mm Hypertension Tako-tsubo CM NSTEMI 11/14 >> Cath 11/14: no CAD EF 30-35 (01/2013) EF 55-60 (12/2018) TTE 07/05/20: EF 60-65, no RWMA, GR 1 DD, GLS -22.1, normal RVSF, RVSP 34.8, trivial MR, mild dilation of ascending aorta (39 mm)  Bipolar d/o         History of Present Illness:  Discussed the use of AI scribe software for clinical note transcription with the patient, who gave verbal consent to proceed.  Deborah Williamson is a 62 y.o. 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 She was last seen by Dr. Clifton James in 10/2021. Follow up CT in 05/2022 demonstrated ascending aorta 40 mm.   She is here alone. The patient reported no new symptoms or concerns, denying chest pain, shortness of breath, dizziness, and syncope. She has not had orthopnea, leg edema. She also denied any tobacco or alcohol use.     ROS   See HPI     Studies Reviewed:   EKG Interpretation Date/Time:  Friday January 10 2023 09:28:38 EDT Ventricular Rate:  52 PR Interval:  216 QRS Duration:  80 QT Interval:  472 QTC Calculation: 438 R Axis:   19  Text Interpretation: Sinus bradycardia with 1st degree A-V block with Premature atrial complexes Normal axis No significant change since last tracing Confirmed by Tereso Newcomer [phone]) on 01/10/2023 9:45:44 AM    Results   LABS - Chart Review Total cholesterol: 121 (01/18/2022) Triglycerides: 52 (01/18/2022) HDL: 48 (01/18/2022) LDL: 61 (01/18/2022) Potassium: 4.3 (05/27/2022) Creatinine: 0.84 (05/27/2022) ALT: 13 (01/18/2022) Hemoglobin: 11.1 (01/18/2022)      Risk Assessment/Calculations:             Physical Exam:   VS:  BP 100/62    Pulse (!) 52   Ht 5\' 6"  (1.676 m)   Wt 246 lb 12.8 oz (111.9 kg)   LMP 03/16/2017   SpO2 95%   BMI 39.83 kg/m    Wt Readings from Last 3 Encounters:  01/10/23 246 lb 12.8 oz (111.9 kg)  01/22/22 236 lb (107 kg)  10/29/21 236 lb (107 kg)    Constitutional:      Appearance: Healthy appearance. Not in distress.  Neck:     Vascular: No carotid bruit. JVD normal.  Pulmonary:     Breath sounds: Normal breath sounds. No wheezing. No rales.  Cardiovascular:     Normal rate. Regular rhythm.     Murmurs: There is no murmur.  Edema:    Peripheral edema absent.  Abdominal:     Palpations: Abdomen is soft.        Assessment and Plan:   Assessment & Plan Takotsubo cardiomyopathy Her ejection fraction improved from 30-35% to 60-65% as shown on the most recent echocardiogram in 2022, with a stable volume status and NYHA class II. We will continue Carvedilol 12.5mg  twice daily and Losartan 100mg  daily. F/u 1 year.  Primary hypertension Her blood pressure is controlled. We will continue Amlodipine 5mg  daily, Carvedilol 12.5mg  twice daily, Losartan 100mg  daily, and HCTZ 12.5mg  daily. Aneurysm of ascending aorta without rupture (HCC) The dilated ascending aorta remains stable at 40mm on CT as of March 2024. We plan  to repeat the CT in 2 years.          Dispo:  Return in about 1 year (around 01/10/2024) for Routine Follow Up, w/ Dr. Clifton James.  Signed, Tereso Newcomer, PA-C

## 2023-01-10 ENCOUNTER — Ambulatory Visit: Payer: Medicare HMO | Attending: Physician Assistant | Admitting: Physician Assistant

## 2023-01-10 ENCOUNTER — Encounter: Payer: Self-pay | Admitting: Physician Assistant

## 2023-01-10 VITALS — BP 100/62 | HR 52 | Ht 66.0 in | Wt 246.8 lb

## 2023-01-10 DIAGNOSIS — I7121 Aneurysm of the ascending aorta, without rupture: Secondary | ICD-10-CM

## 2023-01-10 DIAGNOSIS — I10 Essential (primary) hypertension: Secondary | ICD-10-CM | POA: Diagnosis not present

## 2023-01-10 DIAGNOSIS — I5181 Takotsubo syndrome: Secondary | ICD-10-CM

## 2023-01-10 MED ORDER — ROSUVASTATIN CALCIUM 10 MG PO TABS: 10.0000 mg | ORAL_TABLET | Freq: Every day | ORAL | 3 refills | Status: DC

## 2023-01-10 MED ORDER — HYDROCHLOROTHIAZIDE 12.5 MG PO CAPS: 12.5000 mg | ORAL_CAPSULE | Freq: Every day | ORAL | 3 refills | Status: DC

## 2023-01-10 MED ORDER — LOSARTAN POTASSIUM 100 MG PO TABS: 100.0000 mg | ORAL_TABLET | Freq: Every day | ORAL | 3 refills | Status: DC

## 2023-01-10 MED ORDER — AMLODIPINE BESYLATE 5 MG PO TABS: 5.0000 mg | ORAL_TABLET | Freq: Every day | ORAL | 3 refills | Status: DC

## 2023-01-10 MED ORDER — CARVEDILOL 12.5 MG PO TABS
12.5000 mg | ORAL_TABLET | Freq: Two times a day (BID) | ORAL | 3 refills | Status: AC
Start: 2023-01-10 — End: ?

## 2023-01-10 NOTE — Assessment & Plan Note (Signed)
Her blood pressure is controlled. We will continue Amlodipine 5mg  daily, Carvedilol 12.5mg  twice daily, Losartan 100mg  daily, and HCTZ 12.5mg  daily.

## 2023-01-10 NOTE — Assessment & Plan Note (Signed)
Her ejection fraction improved from 30-35% to 60-65% as shown on the most recent echocardiogram in 2022, with a stable volume status and NYHA class II. We will continue Carvedilol 12.5mg  twice daily and Losartan 100mg  daily. F/u 1 year.

## 2023-01-10 NOTE — Assessment & Plan Note (Signed)
The dilated ascending aorta remains stable at 40mm on CT as of March 2024. We plan to repeat the CT in 2 years.

## 2023-01-10 NOTE — Patient Instructions (Signed)
Medication Instructions:  Your physician recommends that you continue on your current medications as directed. Please refer to the Current Medication list given to you today.  *If you need a refill on your cardiac medications before your next appointment, please call your pharmacy*   Lab Work: None ordered  If you have labs (blood work) drawn today and your tests are completely normal, you will receive your results only by: [place] (if you have MyChart) OR A paper copy in the mail If you have any lab test that is abnormal or we need to change your treatment, we will call you to review the results.   Testing/Procedures: None ordered   Follow-Up: At [facility], you and your health needs are our priority.  As part of our continuing mission to provide you with exceptional heart care, we have created designated Provider Care Teams.  These Care Teams include your primary Cardiologist (physician) and Advanced Practice Providers (APPs -  Physician Assistants and Nurse Practitioners) who all work together to provide you with the care you need, when you need it.  We recommend signing up for the patient portal called "MyChart".  Sign up information is provided on this After Visit Summary.  MyChart is used to connect with patients for Virtual Visits (Telemedicine).  Patients are able to view lab/test results, encounter notes, upcoming appointments, etc.  Non-urgent messages can be sent to your provider as well.   To learn more about what you can do with MyChart, go to [email or web address].    Your next appointment:   1 year(s)  Provider:   Lauree Chandler, MD     Other Instructions

## 2023-01-13 DIAGNOSIS — Z780 Asymptomatic menopausal state: Secondary | ICD-10-CM | POA: Insufficient documentation

## 2023-01-13 DIAGNOSIS — G8929 Other chronic pain: Secondary | ICD-10-CM | POA: Insufficient documentation

## 2023-01-15 ENCOUNTER — Encounter: Payer: Self-pay | Admitting: Internal Medicine

## 2023-01-15 ENCOUNTER — Other Ambulatory Visit: Payer: Self-pay | Admitting: Internal Medicine

## 2023-01-15 ENCOUNTER — Ambulatory Visit: Payer: Medicare HMO | Admitting: Internal Medicine

## 2023-01-15 VITALS — BP 130/82 | HR 68 | Resp 16 | Ht 66.0 in | Wt 244.0 lb

## 2023-01-15 DIAGNOSIS — I10 Essential (primary) hypertension: Secondary | ICD-10-CM | POA: Diagnosis not present

## 2023-01-15 DIAGNOSIS — Z0000 Encounter for general adult medical examination without abnormal findings: Secondary | ICD-10-CM | POA: Diagnosis not present

## 2023-01-15 DIAGNOSIS — Z1159 Encounter for screening for other viral diseases: Secondary | ICD-10-CM

## 2023-01-15 DIAGNOSIS — Z23 Encounter for immunization: Secondary | ICD-10-CM

## 2023-01-15 DIAGNOSIS — Z114 Encounter for screening for human immunodeficiency virus [HIV]: Secondary | ICD-10-CM

## 2023-01-15 DIAGNOSIS — E66812 Obesity, class 2: Secondary | ICD-10-CM

## 2023-01-15 DIAGNOSIS — Z1231 Encounter for screening mammogram for malignant neoplasm of breast: Secondary | ICD-10-CM

## 2023-01-15 DIAGNOSIS — E782 Mixed hyperlipidemia: Secondary | ICD-10-CM

## 2023-01-15 DIAGNOSIS — Z79899 Other long term (current) drug therapy: Secondary | ICD-10-CM

## 2023-01-15 DIAGNOSIS — Z780 Asymptomatic menopausal state: Secondary | ICD-10-CM

## 2023-01-15 DIAGNOSIS — Z6837 Body mass index (BMI) 37.0-37.9, adult: Secondary | ICD-10-CM

## 2023-01-15 NOTE — Patient Instructions (Signed)
Call if you do not hear from [facility] about bone density

## 2023-01-15 NOTE — Progress Notes (Signed)
Subjective:    Patient ID: Deborah Williamson, female   DOB: 04-16-1960, 62 y.o.   MRN: [id number]   HPI  CPE without pap  1.  Pap:  Last 12/2020 with ASCUS and negative HPV.  All normal prior  2.  Mammogram:  Last 11/15/2022 and normal.    3.  Osteoprevention:  3 servings daily of milk/dairy.  Walks 2.5 miles daily.  Step climbing.  Walks dog 3 times daily and lifts weights.    4.  Guaiac Cards/FIT:  Done last with Dr. Marca Ancona before colonoscopy  5.  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  Repeat in 12/2019 with only scarring at site of biopsy.  Rebiopsied.  Do not see surgical path results in her chart, but she states was benign/normal.  She has been told to go to every 10 years now--due in 2031  6.  Immunizations: Needs covid vaccine Immunization History  Administered Date(s) Administered   Influenza Inj Mdck Quad Pf 01/02/2017   Influenza Inj Mdck Quad With Preservative 01/14/2020   Influenza-Unspecified 02/10/2017, 11/21/2021, 12/13/2022   Moderna Covid-19 Vaccine Bivalent Booster 106yrs & up 01/19/2021   Moderna Sars-Covid-2 Vaccination 05/20/2019, 06/22/2019, 01/17/2020, 10/13/2020   Pfizer(Comirnaty)Fall Seasonal Vaccine 12 years and older 01/22/2022   Pneumococcal Polysaccharide-23 01/15/2019   Rsv, Bivalent, Protein Subunit Rsvpref,pf Verdis Frederickson) 01/22/2022   Td 01/14/2020   Tdap 08/29/2008   Zoster Recombinant(Shingrix) 01/19/2021, 02/07/2022     7.  Glucose/Cholesterol:  History of hyperglycemia, but A1C in normal range.  Cholesterol excellent last year. Lipid Panel     Component Value Date/Time   CHOL 121 01/18/2022 0957   TRIG 52 01/18/2022 0957   HDL 48 01/18/2022 0957   CHOLHDL 3 05/07/2013 1057   VLDL 6.0 05/07/2013 1057   LDLCALC 61 01/18/2022 0957   LABVLDL 12 01/18/2022 0957     Current Meds  Medication Sig   acetaminophen (TYLENOL) 500 MG tablet Take 1,000 mg by mouth 2 (two) times daily as needed for  moderate pain or headache.   amLODipine (NORVASC) 5 MG tablet Take 1 tablet (5 mg total) by mouth daily.   Ascorbic Acid (VITAMIN C ADULT GUMMIES PO) Take by mouth. 2 daily   aspirin EC 81 MG tablet Take 81 mg by mouth daily.   carvedilol (COREG) 12.5 MG tablet Take 1 tablet (12.5 mg total) by mouth 2 (two) times daily.   FIBER ADULT GUMMIES PO Take by mouth. 2 daily   hydrochlorothiazide (MICROZIDE) 12.5 MG capsule Take 1 capsule (12.5 mg total) by mouth daily.   losartan (COZAAR) 100 MG tablet Take 1 tablet (100 mg total) by mouth daily.   Multiple Vitamin (MULTIVITAMIN WITH MINERALS) TABS tablet Take 1 tablet by mouth daily.   Multiple Vitamins-Minerals (HAIR SKIN AND NAILS FORMULA) TABS Take 1 tablet by mouth daily.   OVER THE COUNTER MEDICATION 2 (two) times daily. Apple cider vinegar gummy   rosuvastatin (CRESTOR) 10 MG tablet Take 1 tablet (10 mg total) by mouth daily.   tiZANidine (ZANAFLEX) 4 MG tablet Take 4 mg by mouth every 8 (eight) hours as needed for muscle spasms.   Allergies  Allergen Reactions   Lisinopril Other (See Comments)    Dry cough   Vicodin [Hydrocodone-Acetaminophen] Nausea Only   Past Medical History:  Diagnosis Date   Anxiety    Ascending aortic aneurysm (HCC) 01/2013   3.5-4 cm noted on CT-A   Bipolar II disorder, most recent episode major depressive (  HCC)    Follow[facility] at [facility].  Cline Crock, counselor:  [phone]   DJD (degenerative joint disease) of knee 01/02/2017   Dysrhythmia    "irregular" (02/17/2013)   Family history of breast cancer    Family history of prostate cancer    Headache(784.0)    "probably once/week" (02/17/2013)   Hyperlipidemia    Hypertension    Insomnia    Left ovarian cyst 01/2013   Incidental CT finding   Migraine    "probably once/week" (02/17/2013)   Morbid obesity (HCC) 01/02/2017   Normocytic anemia    NSTEMI (non-ST elevated myocardial infarction) (HCC) 02/16/2013   Takotsubo cardiomyopathy, normal cors  / Echocardiogram 4/22: EF 60-65, no RWMA, Gr 1 DD, GLS -22.1%, normal RVSF, RVSP 34.8, trivial MR, ascending aorta 39 mm   Obesity    OCD (obsessive compulsive disorder)    Plantar fasciitis of right foot    PTSD (post-traumatic stress disorder)    Takotsubo cardiomyopathy 01/2013   a. 2D echo 02/17/13: EF 30-35%, periapical AK, normal RV size/function, moderate pulmonary HTN. c/w Takotsubo CM. ;  b.  Echo (04/2013): EF 55%, no WMA, Gr 1 DD, mildly dilated ascending aorta (39 mm), mild MR, mild BAE, PASP 33   Trichomonas vaginitis 10/2014   Past Surgical History:  Procedure Laterality Date   BRACHIOPLASTY Bilateral 05/2011   Plastic Surgery to remove loose skin after Weight Loss   CARDIAC CATHETERIZATION  02/17/2013   no angiographic evidence of CAD, EF 35%, HK or the anteroapical wall, apex and inferoapical wall   FOOT SURGERY Right    KNEE ARTHROSCOPY Right 02/05/2017   [facility]   LEFT HEART CATHETERIZATION WITH CORONARY ANGIOGRAM N/A 02/17/2013   Procedure: LEFT HEART CATHETERIZATION WITH CORONARY ANGIOGRAM;  Surgeon: Kathleene Hazel, MD;  Location[facility] CATH LAB;  Service: Cardiovascular;  Laterality: N/A;   MASS EXCISION Right 02/29/2016   Procedure: EXCISION MASS, right wrist;  Surgeon: Betha Loa, MD;  Location: [facility];  Service: Orthopedics;  Laterality: Right;  EXCISION MASS, right wrist   TOTAL KNEE ARTHROPLASTY Left 03/10/2017   Procedure: TOTAL KNEE ARTHROPLASTY;  Surgeon: Jodi Geralds, MD;  Location: [facility] OR;  Service: Orthopedics;  Laterality: Left;   TUBAL LIGATION  1989   Family History  Problem Relation Age of Onset   Breast cancer Mother 74       Recurrence at 37 in opposite breast; recurrence 2023 under arm   Depression Sister    Obesity Daughter        Gastric sleeve   Breast cancer Maternal Aunt 45   Breast cancer Maternal Aunt 55   Prostate cancer Maternal Uncle 81   Prostate cancer Maternal Uncle 81   Heart attack Maternal  Grandmother    Other Maternal Grandfather 20       old age   Diabetes Paternal Grandmother    Stroke Paternal Grandmother    Breast cancer Cousin 13       Daughter of maternal aunt with breast cancer   Breast cancer Cousin 98   Breast cancer Half-Sister 60       Breast cancer   Social History   Socioeconomic History   Marital status: Legally Separated    Spouse name: Not on file   Number of children: 2   Years of education: Not on file   Highest education level: Bachelor's degree (e.g., BA, AB, BS)  Occupational History   Occupation: unemployed/disability  Tobacco Use   Smoking status: Never  Passive exposure: Never   Smokeless tobacco: Never  Vaping Use   Vaping status: Never Used  Substance and Sexual Activity   Alcohol use: No   Drug use: No   Sexual activity: Not Currently  Other Topics Concern   Not on file  Social History Narrative   Lives alone with her dog, Nellie   Prior to health issues, 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[employer]   Social Determinants of Health   Financial Resource Strain: Low Risk  (01/22/2022)   Overall Financial Resource Strain (CARDIA)    Difficulty of Paying Living Expenses: Not hard at all  Food Insecurity: No Food Insecurity (01/15/2023)   Hunger Vital Sign    Worried About Running Out of Food in the Last Year: Never true    Ran Out of Food in the Last Year: Never true  Transportation Needs: No Transportation Needs (01/15/2023)   PRAPARE - Administrator, Civil Service (Medical): No    Lack of Transportation (Non-Medical): No  Physical Activity: Not on file  Stress: Not on file  Social Connections: Not on file  Intimate Partner Violence: Not At Risk (01/15/2023)   Humiliation, Afraid, Rape, and Kick questionnaire    Fear of Current or Ex-Partner: No    Emotionally Abused: No    Physically Abused: No    Sexually Abused: No     Review of Systems  Constitutional:  Negative for unexpected weight  change (Has hit a wall with weight loss.  Very active currently for almost a year.  Premier shake in AM.  Salad with protein for lunch with cheese and catalina dressing.  Fruit snack. Dinner:  meat/fish, vegetables, starch.   Drinks water).  Skin:        Bit on left foot when walking dog and she was wearing sandals.   Blistered in 3 spots with pus.   She opened a small area. Seems to be healing.        Objective:   BP 130/82 (BP Location: Left Arm, Patient Position: Sitting)   Pulse 68   Resp 16   Ht 5\' 6"  (1.676 m)   Wt 244 lb (110.7 kg)   LMP 03/16/2017   BMI 39.38 kg/m   Physical Exam Constitutional:      Appearance: She is obese.  HENT:     Head: Normocephalic and atraumatic.     Right Ear: Tympanic membrane, ear canal and external ear normal.     Left Ear: Tympanic membrane, ear canal and external ear normal.     Nose: Nose normal.     Mouth/Throat:     Mouth: Mucous membranes are moist.     Pharynx: Oropharynx is clear.  Eyes:     Extraocular Movements: Extraocular movements intact.     Conjunctiva/sclera: Conjunctivae normal.     Pupils: Pupils are equal, round, and reactive to light.     Comments: Discs sharp  Neck:     Thyroid: No thyroid mass or thyromegaly.  Cardiovascular:     Rate and Rhythm: Normal rate and regular rhythm.     Heart sounds: S1 normal and S2 normal. No murmur heard.    No friction rub. No S3 or S4 sounds.     Comments: No carotid bruits.  Carotid, radial, femoral, DP and PT pulses normal and equal.   Pulmonary:     Effort: Pulmonary effort is normal.     Breath sounds: Normal breath sounds and air entry.  Chest:  Breasts:    Right: No inverted nipple, mass or nipple discharge.     Left: No inverted nipple, mass or nipple discharge.  Abdominal:     General: Bowel sounds are normal.     Palpations: Abdomen is soft. There is no hepatomegaly, splenomegaly or mass.     Tenderness: There is no abdominal tenderness.     Hernia: No hernia  is present.  Genitourinary:    Comments: Normal external female genitalia No uterine or adnexal mass or tenderness. Musculoskeletal:        General: Normal range of motion.     Cervical back: Normal range of motion and neck supple.     Right lower leg: No edema.     Left lower leg: No edema.  Feet:     Right foot:     Skin integrity: Skin integrity normal.     Left foot:     Skin integrity: Skin integrity normal.  Lymphadenopathy:     Head:     Right side of head: No submental or submandibular adenopathy.     Left side of head: No submental or submandibular adenopathy.     Cervical: No cervical adenopathy.     Upper Body:     Right upper body: No supraclavicular or axillary adenopathy.     Left upper body: No supraclavicular or axillary adenopathy.     Lower Body: No right inguinal adenopathy. No left inguinal adenopathy.  Skin:    General: Skin is warm.     Capillary Refill: Capillary refill takes less than 2 seconds.     Findings: Lesion (Seborrheic keratoses scattered on back) present.  Neurological:     General: No focal deficit present.     Mental Status: She is oriented to person, place, and time.     Cranial Nerves: Cranial nerves 2-12 are intact.     Sensory: Sensation is intact.     Motor: Motor function is intact.     Coordination: Coordination is intact.     Gait: Gait is intact.     Deep Tendon Reflexes: Reflexes are normal and symmetric.  Psychiatric:        Attention and Perception: Attention normal.        Mood and Affect: Mood normal.        Behavior: Behavior normal. Behavior is cooperative.      Assessment & Plan   CPE without pap FIT to return in 2 weeks. Spikevax Covid vaccine DEXA Bone density. CBC, CMP, HIV, Hep C screening.  2. Hypertension:  controlled  3.  Hyperlipidemia:  FLP  4.  Obesity:  Discussed diet and physical activity to improve weight.  Weight check in 6 months with other health issues.

## 2023-01-16 LAB — CBC WITH DIFFERENTIAL/PLATELET
Basophils Absolute: 0 10*3/uL (ref 0.0–0.2)
Basos: 0 %
EOS (ABSOLUTE): 0.1 10*3/uL (ref 0.0–0.4)
Eos: 1 %
Hematocrit: 37.7 % (ref 34.0–46.6)
Hemoglobin: 11.8 g/dL (ref 11.1–15.9)
Immature Grans (Abs): 0 10*3/uL (ref 0.0–0.1)
Immature Granulocytes: 0 %
Lymphocytes Absolute: 1.8 10*3/uL (ref 0.7–3.1)
Lymphs: 33 %
MCH: 26.9 pg (ref 26.6–33.0)
MCHC: 31.3 g/dL — ABNORMAL LOW (ref 31.5–35.7)
MCV: 86 fL (ref 79–97)
Monocytes Absolute: 0.3 10*3/uL (ref 0.1–0.9)
Monocytes: 6 %
Neutrophils Absolute: 3.3 10*3/uL (ref 1.4–7.0)
Neutrophils: 60 %
Platelets: 286 10*3/uL (ref 150–450)
RBC: 4.39 x10E6/uL (ref 3.77–5.28)
RDW: 12.9 % (ref 11.7–15.4)
WBC: 5.5 10*3/uL (ref 3.4–10.8)

## 2023-01-16 LAB — LIPID PANEL W/O CHOL/HDL RATIO
Cholesterol, Total: 136 mg/dL (ref 100–199)
HDL: 51 mg/dL (ref >39)
LDL Chol Calc (NIH): 72 mg/dL (ref 0–99)
Triglycerides: 65 mg/dL (ref 0–149)
VLDL Cholesterol Cal: 13 mg/dL (ref 5–40)

## 2023-01-16 LAB — COMPREHENSIVE METABOLIC PANEL
ALT: 12 [IU]/L (ref 0–32)
AST: 17 [IU]/L (ref 0–40)
Albumin: 3.9 g/dL (ref 3.9–4.9)
Alkaline Phosphatase: 95 [IU]/L (ref 44–121)
BUN/Creatinine Ratio: 22 (ref 12–28)
BUN: 19 mg/dL (ref 8–27)
Bilirubin Total: 0.6 mg/dL (ref 0.0–1.2)
CO2: 23 mmol/L (ref 20–29)
Calcium: 9.1 mg/dL (ref 8.7–10.3)
Chloride: 103 mmol/L (ref 96–106)
Creatinine, Ser: 0.85 mg/dL (ref 0.57–1.00)
Globulin, Total: 3 g/dL (ref 1.5–4.5)
Glucose: 95 mg/dL (ref 70–99)
Potassium: 3.7 mmol/L (ref 3.5–5.2)
Sodium: 141 mmol/L (ref 134–144)
Total Protein: 6.9 g/dL (ref 6.0–8.5)
eGFR: 78 mL/min/{1.73_m2} (ref >59)

## 2023-01-16 LAB — HIV ANTIBODY (ROUTINE TESTING W REFLEX): HIV Screen 4th Generation wRfx: NONREACTIVE

## 2023-01-16 LAB — HEPATITIS C ANTIBODY: Hep C Virus Ab: NONREACTIVE

## 2023-01-16 LAB — TSH: TSH: 1.48 u[IU]/mL (ref 0.450–4.500)

## 2023-07-16 ENCOUNTER — Ambulatory Visit (INDEPENDENT_AMBULATORY_CARE_PROVIDER_SITE_OTHER): Payer: Medicare HMO | Admitting: Internal Medicine

## 2023-07-16 ENCOUNTER — Encounter: Payer: Self-pay | Admitting: Internal Medicine

## 2023-07-16 VITALS — BP 126/80 | HR 58 | Resp 16 | Ht 66.0 in | Wt 242.2 lb

## 2023-07-16 DIAGNOSIS — Z23 Encounter for immunization: Secondary | ICD-10-CM

## 2023-07-16 DIAGNOSIS — Z6837 Body mass index (BMI) 37.0-37.9, adult: Secondary | ICD-10-CM | POA: Diagnosis not present

## 2023-07-16 DIAGNOSIS — E66812 Obesity, class 2: Secondary | ICD-10-CM | POA: Diagnosis not present

## 2023-07-16 MED ORDER — TIRZEPATIDE 2.5 MG/0.5ML ~~LOC~~ SOAJ: 2.5000 mg | SUBCUTANEOUS | 11 refills | Status: DC

## 2023-07-16 NOTE — Progress Notes (Signed)
    Subjective:    Patient ID: Deborah Williamson, female   DOB: 01/29/1961, 63 y.o.   MRN: [id number]   HPI  Obesity:  Has been eating very healthy.  Has stopped eating after dinner over past 6 months.  Walks 2.5 miles daily.  Weights M through F.  Also, walks her dogs 3 times daily.  Still with no weight loss, but has kept weight from increasing.  Up and down stairs where she lives throughout day,   Would like support from medication to lose.  Has not had A1C checked in past 3 years.  Current Meds  Medication Sig   acetaminophen  (TYLENOL ) 500 MG tablet Take 1,000 mg by mouth 2 (two) times daily as needed for moderate pain or headache.   amLODipine  (NORVASC ) 5 MG tablet Take 1 tablet (5 mg total) by mouth daily.   Ascorbic Acid (VITAMIN C ADULT GUMMIES PO) Take by mouth. 2 daily   aspirin  EC 81 MG tablet Take 81 mg by mouth daily.   carvedilol  (COREG ) 12.5 MG tablet Take 1 tablet (12.5 mg total) by mouth 2 (two) times daily.   FIBER ADULT GUMMIES PO Take by mouth. 2 daily   hydrochlorothiazide  (MICROZIDE ) 12.5 MG capsule Take 1 capsule (12.5 mg total) by mouth daily.   losartan  (COZAAR ) 100 MG tablet Take 1 tablet (100 mg total) by mouth daily.   Multiple Vitamin (MULTIVITAMIN WITH MINERALS) TABS tablet Take 1 tablet by mouth daily.   Multiple Vitamins-Minerals (HAIR SKIN AND NAILS FORMULA) TABS Take 1 tablet by mouth daily.   rosuvastatin  (CRESTOR ) 10 MG tablet Take 1 tablet (10 mg total) by mouth daily.   tirzepatide (MOUNJARO) 2.5 MG/0.5ML Pen Inject 2.5 mg into the skin once a week.   tiZANidine  (ZANAFLEX ) 4 MG tablet Take 4 mg by mouth every 8 (eight) hours as needed for muscle spasms.   Allergies  Allergen Reactions   Lisinopril  Other (See Comments)    Dry cough   Vicodin [Hydrocodone -Acetaminophen ] Nausea Only     Review of Systems    Objective:   BP 126/80 (BP Location: Left Arm, Patient Position: Sitting, Cuff Size: Normal)   Pulse (!) 58   Resp 16   Wt 242 lb 4  oz (109.9 kg)   LMP 03/16/2017   SpO2 99%   BMI 39.10 kg/m   Physical Exam NAD Obese Lungs:  CTA CV:  RRR without murmur or rub.  Radial and DP pulse normal and equal   Assessment & Plan   Obesity:  has tried working on diet and physical activity over past 6 months without any weight loss.  Would like to try GLP1 agonist.  Appears [name] is covered.  Start 2.5 mg injected weekly.  She will return to learn how to inject once she has the pen.   Follow up for weigh in 2 weeks after starting Carry peanut butter crackers with he at all times should she feel a low blood glucose. A1C  2.  HM:  Pneumococcal 20 conjugate.

## 2023-07-16 NOTE — Patient Instructions (Signed)
 Call when you have Mounjaro and will have you come in and learn from [place] how to inject.

## 2023-07-17 ENCOUNTER — Telehealth: Payer: Self-pay

## 2023-07-17 ENCOUNTER — Encounter: Payer: Self-pay | Admitting: Internal Medicine

## 2023-07-17 LAB — HGB A1C W/O EAG: Hgb A1c MFr Bld: 5.7 % — ABNORMAL HIGH (ref 4.8–5.6)

## 2023-07-17 NOTE — Telephone Encounter (Signed)
 Patients Rx for Mounjaro was not covered. Insurance sent notification that no medications for weight loss are covered by medicare. Patient will be notified.

## 2023-07-17 NOTE — Telephone Encounter (Signed)
 Patient has been notified

## 2023-07-17 NOTE — Telephone Encounter (Signed)
 Patient reports that she was approved for an assistance program through the manufacturer. Will check with the pharmacy if they assistance card goes through.

## 2023-07-18 ENCOUNTER — Other Ambulatory Visit: Payer: Self-pay

## 2023-07-18 DIAGNOSIS — E66812 Obesity, class 2: Secondary | ICD-10-CM

## 2023-07-18 MED ORDER — TIRZEPATIDE 2.5 MG/0.5ML ~~LOC~~ SOAJ: 2.5000 mg | SUBCUTANEOUS | 11 refills | Status: DC

## 2023-07-22 ENCOUNTER — Encounter: Payer: Self-pay | Admitting: Internal Medicine

## 2023-08-12 NOTE — Telephone Encounter (Signed)
 Can you call and see if the assistance went through for the Mounjaro as she needs follow up 2 weeks after starting with weight check and to be sure sugars aren't dropping.

## 2023-08-14 ENCOUNTER — Encounter: Payer: Self-pay | Admitting: Internal Medicine

## 2023-08-14 NOTE — Telephone Encounter (Signed)
 Patient filled for appeal Insurance company sent paperwork to doctor office for icd-9 codes Paperwork filled out and sent back

## 2023-08-14 NOTE — Telephone Encounter (Signed)
 Patient reports she has not been approve for Mounjaro with the new dx code of prediabetes.   Patient reports her insurance will pay for zepbound.

## 2023-08-14 NOTE — Telephone Encounter (Signed)
 Patient aware.

## 2023-08-14 NOTE — Telephone Encounter (Signed)
 [employer] and was told patient needs to fill an appeal.

## 2023-08-25 ENCOUNTER — Other Ambulatory Visit: Payer: Self-pay

## 2023-09-02 ENCOUNTER — Telehealth: Payer: Self-pay

## 2023-09-02 ENCOUNTER — Encounter: Payer: Self-pay | Admitting: Internal Medicine

## 2023-09-02 NOTE — Telephone Encounter (Signed)
 Patient would like an update on medication "Zepbound".

## 2023-09-04 NOTE — Telephone Encounter (Signed)
 Did you try and send Tirzepatide (Zepbound) to the [place] address she sent?

## 2023-09-08 ENCOUNTER — Other Ambulatory Visit: Payer: Self-pay

## 2023-09-08 MED ORDER — TIRZEPATIDE-WEIGHT MANAGEMENT 2.5 MG/0.5ML ~~LOC~~ SOLN: 2.5000 mg | SUBCUTANEOUS | 11 refills | Status: DC

## 2023-09-08 NOTE — Telephone Encounter (Signed)
 Patient called and states that she received a text from pharmacy where medication was sent to and was asked to call her pcp office to ask if we receive a request from them to correct a prescription.  Patient reports pharmacy did receive prescription but something was wrong and they send a document to nurse to correct it.

## 2023-09-08 NOTE — Telephone Encounter (Signed)
Done and patient is aware

## 2023-09-30 ENCOUNTER — Telehealth: Payer: Self-pay | Admitting: Internal Medicine

## 2023-09-30 NOTE — Telephone Encounter (Signed)
 Patient called and states that she has been on Zepbound  at a 2.5mg  for 4 weeks, patient reports that tomorrow will be her last dose and she would like to get a new prescription for 5mg  , patient states she was told that normally after taking this medication for four weeks she is suppose to increase it to 5mg .   Prescription to be sent to [facility]

## 2023-10-01 ENCOUNTER — Other Ambulatory Visit

## 2023-10-01 ENCOUNTER — Telehealth: Payer: Self-pay | Admitting: Internal Medicine

## 2023-10-01 DIAGNOSIS — Z6837 Body mass index (BMI) 37.0-37.9, adult: Secondary | ICD-10-CM | POA: Diagnosis not present

## 2023-10-01 DIAGNOSIS — E66812 Obesity, class 2: Secondary | ICD-10-CM | POA: Diagnosis not present

## 2023-10-01 NOTE — Telephone Encounter (Signed)
 Patient has been seen.

## 2023-10-01 NOTE — Telephone Encounter (Signed)
 Patient states she needs to come back in 30 days, patient does have an appointment on 11/06/23, but patient states that doctor mulberry wants her to come in 30 days exact 11/01/23, because she needs to do changes in medication and patient states she gets her medication by mail and does not want it to be delay.  I notify patient I will have her on wait list if there is a cancellation 11/01/23 but patient states what if there is no cancellations , I told her I had to talk to doctor patient states to talk as soon as I can because she does not want medication to be delayed.

## 2023-10-01 NOTE — Patient Instructions (Signed)
 Drink a glass of water before every meal Drink 6-8 glasses of water daily Eat three meals daily Eat a protein and healthy fat with every meal (eggs,fish, chicken, [place] and limit red meats) Eat 5 servings of vegetables daily, mix the colors Eat 2 servings of fruit daily with skin, if skin is edible Use smaller plates Put food/utensils down as you chew and swallow each bite Eat at a table with friends/family at least once daily, no TV Do not eat in front of the TV  Recent studies show that people who consume all of their calories in a 12 hour period lose weight more efficiently.  For example, if you eat your first meal at 7:00 a.m., your last meal of the day should be completed by 7:00 p.m.

## 2023-10-01 NOTE — Progress Notes (Signed)
 Taking Zepbound  version of Tirzepatide  2.5 mg weekly 10/01/23 is 4th injection Has lost especially 10 lbs over a month. Does have a dry mouth and tired first 3 days.   Some foods don't have much in way of taste.

## 2023-10-02 NOTE — Telephone Encounter (Signed)
 She can come in for just a weigh in and to see if tolerating the medication and still with weight loss

## 2023-10-06 NOTE — Telephone Encounter (Signed)
 Patient has been scheduled for a weight check 10/29/23.

## 2023-10-29 ENCOUNTER — Telehealth: Payer: Self-pay

## 2023-10-29 ENCOUNTER — Other Ambulatory Visit

## 2023-10-29 MED ORDER — TIRZEPATIDE 5 MG/0.5ML ~~LOC~~ SOAJ: 5.0000 mg | SUBCUTANEOUS | 11 refills | Status: DC

## 2023-10-29 NOTE — Telephone Encounter (Signed)
 See phone message

## 2023-10-30 NOTE — Telephone Encounter (Signed)
 Spoke with pt. In regard to med error.

## 2023-10-31 ENCOUNTER — Other Ambulatory Visit: Payer: Self-pay

## 2023-10-31 MED ORDER — TIRZEPATIDE-WEIGHT MANAGEMENT 5 MG/0.5ML ~~LOC~~ SOLN: 5.0000 mg | SUBCUTANEOUS | 11 refills | Status: DC

## 2023-10-31 NOTE — Telephone Encounter (Signed)
 Called the [facility] in pharmacy and confirmed that they have Zepbound  available in the following concentrations: 2.5mg , 5mg , 7.5mg , 10mg , 12.5mg , 15mg 

## 2023-11-06 ENCOUNTER — Encounter: Payer: Self-pay | Admitting: Internal Medicine

## 2023-11-06 ENCOUNTER — Ambulatory Visit (INDEPENDENT_AMBULATORY_CARE_PROVIDER_SITE_OTHER): Admitting: Internal Medicine

## 2023-11-06 VITALS — BP 118/76 | HR 70 | Resp 18 | Ht 66.0 in | Wt 228.0 lb

## 2023-11-06 DIAGNOSIS — R5383 Other fatigue: Secondary | ICD-10-CM

## 2023-11-06 DIAGNOSIS — R7303 Prediabetes: Secondary | ICD-10-CM | POA: Insufficient documentation

## 2023-11-06 DIAGNOSIS — I10 Essential (primary) hypertension: Secondary | ICD-10-CM

## 2023-11-06 DIAGNOSIS — E66812 Obesity, class 2: Secondary | ICD-10-CM | POA: Diagnosis not present

## 2023-11-06 DIAGNOSIS — Z79899 Other long term (current) drug therapy: Secondary | ICD-10-CM

## 2023-11-06 DIAGNOSIS — Z6837 Body mass index (BMI) 37.0-37.9, adult: Secondary | ICD-10-CM

## 2023-11-06 NOTE — Patient Instructions (Signed)
Stop hydrochlorothiazide

## 2023-11-06 NOTE — Progress Notes (Unsigned)
    Subjective:    Patient ID: Deborah Williamson, female   DOB: [birth year], 63 y.o.   MRN: [id number]   HPI   Obesity with prediabetes:  Went from 242 to 228 lbs between April and August.  She states with weight at home, she leveled off at 225 lbs with her scale at home.  She was just able to take her first dose of the increased dose of Zepbound  at 5 mg yesterday.   Does note fatigue with the medication until the last 2 weeks.  Can get a bit light headed at times.  Only when bending over to pick something up.      Current Meds  Medication Sig   acetaminophen  (TYLENOL ) 500 MG tablet Take 1,000 mg by mouth 2 (two) times daily as needed for moderate pain or headache.   amLODipine  (NORVASC ) 5 MG tablet Take 1 tablet (5 mg total) by mouth daily.   Ascorbic Acid (VITAMIN C ADULT GUMMIES PO) Take by mouth. 2 daily   aspirin  EC 81 MG tablet Take 81 mg by mouth daily.   carvedilol  (COREG ) 12.5 MG tablet Take 1 tablet (12.5 mg total) by mouth 2 (two) times daily.   FIBER ADULT GUMMIES PO Take by mouth. 2 daily   hydrochlorothiazide  (MICROZIDE ) 12.5 MG capsule Take 1 capsule (12.5 mg total) by mouth daily.   losartan  (COZAAR ) 100 MG tablet Take 1 tablet (100 mg total) by mouth daily.   Multiple Vitamin (MULTIVITAMIN WITH MINERALS) TABS tablet Take 1 tablet by mouth daily.   Multiple Vitamins-Minerals (HAIR SKIN AND NAILS FORMULA) TABS Take 1 tablet by mouth daily.   rosuvastatin  (CRESTOR ) 10 MG tablet Take 1 tablet (10 mg total) by mouth daily.   tirzepatide  5 MG/0.5ML injection vial Inject 5 mg into the skin once a week.   tiZANidine  (ZANAFLEX ) 4 MG tablet Take 4 mg by mouth every 8 (eight) hours as needed for muscle spasms.   Allergies  Allergen Reactions   Lisinopril  Other (See Comments)    Dry cough   Vicodin [Hydrocodone -Acetaminophen ] Nausea Only     Review of Systems    Objective:   BP 118/76 (BP Location: Left Arm, Patient Position: Sitting)   Pulse 70   Resp 18   Wt 228 lb  (103.4 kg)   LMP 03/16/2017   BMI 36.80 kg/m   Physical Exam NAD Lungs:  CTA CV:  RRR without murmur or rub.  Radial and DP pulses normal and equal LE:  No edema    Assessment & Plan   Obesity, prediabetes:  Started increased dose of Zepbound  yesterday at 5 mg weekly. Check A1C.  Unable to find donated glucometer and supplies for her today.  Will call back once found.  2.  Light headedness and fatigue at times with Zepbound :  CmP, CBC.  3.  Hypertension and hx of Takotsubo cardiomyopathy:  doing quite well and likely getting a bit dehydrated at times.  Stop Hydrochlorothiazide  and return in 2 weeks for bp and pulse check with weigh in.

## 2023-11-12 LAB — CBC WITH DIFFERENTIAL/PLATELET
Basophils Absolute: 0 x10E3/uL (ref 0.0–0.2)
Basos: 1 %
EOS (ABSOLUTE): 0.2 x10E3/uL (ref 0.0–0.4)
Eos: 3 %
Hematocrit: 36.1 % (ref 34.0–46.6)
Hemoglobin: 11.5 g/dL (ref 11.1–15.9)
Immature Grans (Abs): 0 x10E3/uL (ref 0.0–0.1)
Immature Granulocytes: 0 %
Lymphocytes Absolute: 1.9 x10E3/uL (ref 0.7–3.1)
Lymphs: 32 %
MCH: 27.4 pg (ref 26.6–33.0)
MCHC: 31.9 g/dL (ref 31.5–35.7)
MCV: 86 fL (ref 79–97)
Monocytes Absolute: 0.5 x10E3/uL (ref 0.1–0.9)
Monocytes: 8 %
Neutrophils Absolute: 3.4 x10E3/uL (ref 1.4–7.0)
Neutrophils: 56 %
Platelets: 293 x10E3/uL (ref 150–450)
RBC: 4.19 x10E6/uL (ref 3.77–5.28)
RDW: 14.2 % (ref 11.7–15.4)
WBC: 6 x10E3/uL (ref 3.4–10.8)

## 2023-11-12 LAB — COMPREHENSIVE METABOLIC PANEL WITH GFR
ALT: 15 IU/L (ref 0–32)
AST: 24 IU/L (ref 0–40)
Albumin: 4 g/dL (ref 3.9–4.9)
Alkaline Phosphatase: 79 IU/L (ref 44–121)
BUN/Creatinine Ratio: 25 (ref 12–28)
BUN: 29 mg/dL — ABNORMAL HIGH (ref 8–27)
Bilirubin Total: 0.4 mg/dL (ref 0.0–1.2)
CO2: 20 mmol/L (ref 20–29)
Calcium: 9.5 mg/dL (ref 8.7–10.3)
Chloride: 100 mmol/L (ref 96–106)
Creatinine, Ser: 1.16 mg/dL — ABNORMAL HIGH (ref 0.57–1.00)
Globulin, Total: 3 g/dL (ref 1.5–4.5)
Glucose: 93 mg/dL (ref 70–99)
Potassium: 3.7 mmol/L (ref 3.5–5.2)
Sodium: 139 mmol/L (ref 134–144)
Total Protein: 7 g/dL (ref 6.0–8.5)
eGFR: 54 mL/min/1.73 — ABNORMAL LOW (ref >59)

## 2023-11-12 LAB — HGB A1C W/O EAG: Hgb A1c MFr Bld: 5.6 % (ref 4.8–5.6)

## 2023-11-17 ENCOUNTER — Ambulatory Visit
Admission: RE | Admit: 2023-11-17 | Discharge: 2023-11-17 | Disposition: A | Discharge status: Home / Self Care | Payer: Medicare HMO | Source: Ambulatory Visit | Attending: Internal Medicine | Admitting: Internal Medicine

## 2023-11-17 ENCOUNTER — Ambulatory Visit (HOSPITAL_BASED_OUTPATIENT_CLINIC_OR_DEPARTMENT_OTHER)
Admission: RE | Admit: 2023-11-17 | Discharge: 2023-11-17 | Disposition: A | Discharge status: Home / Self Care | Source: Ambulatory Visit | Attending: Internal Medicine | Admitting: Internal Medicine

## 2023-11-17 ENCOUNTER — Other Ambulatory Visit: Payer: Medicare HMO

## 2023-11-17 DIAGNOSIS — Z780 Asymptomatic menopausal state: Secondary | ICD-10-CM | POA: Insufficient documentation

## 2023-11-17 DIAGNOSIS — Z1231 Encounter for screening mammogram for malignant neoplasm of breast: Secondary | ICD-10-CM

## 2023-11-21 ENCOUNTER — Ambulatory Visit: Payer: Self-pay | Admitting: Internal Medicine

## 2023-11-21 ENCOUNTER — Other Ambulatory Visit

## 2023-11-26 ENCOUNTER — Telehealth: Payer: Self-pay | Admitting: Internal Medicine

## 2023-11-26 NOTE — Telephone Encounter (Signed)
 Called patient to remind her of her upcoming appointment for weight check and bp check,  Patient states she would like to know her lab results.  Patient states she is concern because she saw a lot of labs low on her Mychart and has not received a call from us  with results.

## 2023-11-28 ENCOUNTER — Other Ambulatory Visit (INDEPENDENT_AMBULATORY_CARE_PROVIDER_SITE_OTHER)

## 2023-11-28 NOTE — Progress Notes (Signed)
 Pt current weight 225lbs Previous weight 228lbs. Injection day Wednesday Dose 0.5 Zebound Reports that she would like to increase dose amount because she is not losing any weight. Also glucometer was giving at visit today.

## 2023-12-15 ENCOUNTER — Ambulatory Visit: Payer: Self-pay | Admitting: Internal Medicine

## 2023-12-16 ENCOUNTER — Other Ambulatory Visit: Payer: Self-pay

## 2023-12-16 MED ORDER — TIRZEPATIDE-WEIGHT MANAGEMENT 7.5 MG/0.5ML ~~LOC~~ SOLN: 7.5000 mg | SUBCUTANEOUS | 11 refills | Status: DC

## 2023-12-16 MED ORDER — TIRZEPATIDE-WEIGHT MANAGEMENT 5 MG/0.5ML ~~LOC~~ SOLN: 7.5000 mg | SUBCUTANEOUS | 11 refills | Status: DC

## 2023-12-20 ENCOUNTER — Other Ambulatory Visit: Payer: Self-pay | Admitting: Cardiovascular Disease

## 2024-01-09 ENCOUNTER — Other Ambulatory Visit (INDEPENDENT_AMBULATORY_CARE_PROVIDER_SITE_OTHER): Payer: Medicare HMO

## 2024-01-09 DIAGNOSIS — E782 Mixed hyperlipidemia: Secondary | ICD-10-CM

## 2024-01-09 DIAGNOSIS — Z79899 Other long term (current) drug therapy: Secondary | ICD-10-CM

## 2024-01-10 LAB — CBC WITH DIFFERENTIAL/PLATELET
Basophils Absolute: 0 x10E3/uL (ref 0.0–0.2)
Basos: 1 %
EOS (ABSOLUTE): 0.1 x10E3/uL (ref 0.0–0.4)
Eos: 2 %
Hematocrit: 35.9 % (ref 34.0–46.6)
Hemoglobin: 10.9 g/dL — ABNORMAL LOW (ref 11.1–15.9)
Immature Grans (Abs): 0 x10E3/uL (ref 0.0–0.1)
Immature Granulocytes: 0 %
Lymphocytes Absolute: 1.8 x10E3/uL (ref 0.7–3.1)
Lymphs: 34 %
MCH: 26.5 pg — ABNORMAL LOW (ref 26.6–33.0)
MCHC: 30.4 g/dL — ABNORMAL LOW (ref 31.5–35.7)
MCV: 87 fL (ref 79–97)
Monocytes Absolute: 0.3 x10E3/uL (ref 0.1–0.9)
Monocytes: 5 %
Neutrophils Absolute: 3.1 x10E3/uL (ref 1.4–7.0)
Neutrophils: 58 %
Platelets: 290 x10E3/uL (ref 150–450)
RBC: 4.11 x10E6/uL (ref 3.77–5.28)
RDW: 13.4 % (ref 11.7–15.4)
WBC: 5.4 x10E3/uL (ref 3.4–10.8)

## 2024-01-10 LAB — COMPREHENSIVE METABOLIC PANEL WITH GFR
ALT: 8 IU/L (ref 0–32)
AST: 18 IU/L (ref 0–40)
Albumin: 3.8 g/dL — ABNORMAL LOW (ref 3.9–4.9)
Alkaline Phosphatase: 77 IU/L (ref 49–135)
BUN/Creatinine Ratio: 19 (ref 12–28)
BUN: 15 mg/dL (ref 8–27)
Bilirubin Total: 0.5 mg/dL (ref 0.0–1.2)
CO2: 22 mmol/L (ref 20–29)
Calcium: 9.2 mg/dL (ref 8.7–10.3)
Chloride: 102 mmol/L (ref 96–106)
Creatinine, Ser: 0.77 mg/dL (ref 0.57–1.00)
Globulin, Total: 2.8 g/dL (ref 1.5–4.5)
Glucose: 87 mg/dL (ref 70–99)
Potassium: 3.9 mmol/L (ref 3.5–5.2)
Sodium: 138 mmol/L (ref 134–144)
Total Protein: 6.6 g/dL (ref 6.0–8.5)
eGFR: 88 mL/min/1.73 (ref >59)

## 2024-01-10 LAB — LIPID PANEL W/O CHOL/HDL RATIO
Cholesterol, Total: 123 mg/dL (ref 100–199)
HDL: 41 mg/dL (ref >39)
LDL Chol Calc (NIH): 70 mg/dL (ref 0–99)
Triglycerides: 50 mg/dL (ref 0–149)
VLDL Cholesterol Cal: 12 mg/dL (ref 5–40)

## 2024-01-12 ENCOUNTER — Ambulatory Visit: Payer: Self-pay | Admitting: Internal Medicine

## 2024-01-15 ENCOUNTER — Ambulatory Visit (INDEPENDENT_AMBULATORY_CARE_PROVIDER_SITE_OTHER): Payer: Medicare HMO | Admitting: Internal Medicine

## 2024-01-15 ENCOUNTER — Encounter: Payer: Self-pay | Admitting: Internal Medicine

## 2024-01-15 ENCOUNTER — Other Ambulatory Visit: Payer: Self-pay | Admitting: Internal Medicine

## 2024-01-15 VITALS — BP 122/78 | HR 72 | Resp 16 | Ht 66.0 in | Wt 212.5 lb

## 2024-01-15 DIAGNOSIS — R7303 Prediabetes: Secondary | ICD-10-CM | POA: Diagnosis not present

## 2024-01-15 DIAGNOSIS — Z23 Encounter for immunization: Secondary | ICD-10-CM | POA: Diagnosis not present

## 2024-01-15 DIAGNOSIS — E66812 Obesity, class 2: Secondary | ICD-10-CM

## 2024-01-15 DIAGNOSIS — D649 Anemia, unspecified: Secondary | ICD-10-CM

## 2024-01-15 DIAGNOSIS — I10 Essential (primary) hypertension: Secondary | ICD-10-CM

## 2024-01-15 DIAGNOSIS — E782 Mixed hyperlipidemia: Secondary | ICD-10-CM

## 2024-01-15 DIAGNOSIS — Z0000 Encounter for general adult medical examination without abnormal findings: Secondary | ICD-10-CM | POA: Diagnosis not present

## 2024-01-15 DIAGNOSIS — Z124 Encounter for screening for malignant neoplasm of cervix: Secondary | ICD-10-CM

## 2024-01-15 DIAGNOSIS — Z6837 Body mass index (BMI) 37.0-37.9, adult: Secondary | ICD-10-CM

## 2024-01-15 MED ORDER — LORATADINE 10 MG PO TABS: ORAL_TABLET | ORAL | Status: AC

## 2024-01-15 NOTE — Progress Notes (Signed)
 Subjective:    Patient ID: Deborah Williamson, female   DOB: Dec 15, 1960, 63 y.o.   MRN: [id number]   HPI  CPE with pap  1.  Pap:  Last in 12/2020 with ASCUS and negative HPV.  Always normal prior.    2.  Mammogram:  Last in 10/2023 and normal.  Mother with history of breast cancer diagnosed in 33s and recurrence at age 42, both times successfully. 2 maternal aunts diagnosed in their 65s.  Both treated successfully.  Paternal half sister diagnosed at age 83 and successfully treated.   Mother and half sister without genetic predisposition.      3.  Osteoprevention:  DEXA  10/2023 normal.  Yogurt daily.  Cheese several times a week.  Drinks a milk based protein drink every day.  Walks 2 miles daily and lifts weights and also walks her dog 3 times daily.  Total of 45 minutes daily.  4.  Guaiac Cards/FIT:  Never returned.    5.  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  Repeat in 02/2020 with Dr. Saintclair only with scarring.  Reportedly, she did want her to return in 2026 for next examination, instead of 2031 as previously thought.    6.  Immunizations:  Needs COVID vaccination Immunization History  Administered Date(s) Administered    sv, Bivalent, Protein Subunit Rsvpref,pf (Abrysvo) 01/22/2022   Influenza Inj Mdck Quad Pf 01/02/2017   Influenza Inj Mdck Quad With Preservative 01/14/2020   Influenza Whole 11/28/2023   Influenza-Unspecified 02/10/2017, 11/21/2021, 12/13/2022   Moderna Covid-19 Fall Seasonal Vaccine 68yrs & older 01/15/2023   Moderna Covid-19 Vaccine  Bivalent Booster 18yrs & up 01/19/2021   Moderna Sars-Covid-2 Vaccination 05/20/2019, 06/22/2019, 01/17/2020, 10/13/2020   PNEUMOCOCCAL CONJUGATE-20 07/16/2023   Pfizer(Comirnaty)Fall Seasonal Vaccine 12 years and older 01/22/2022   Pneumococcal Polysaccharide-23 01/15/2019   Td 01/14/2020   Tdap 08/29/2008   Zoster Recombinant(Shingrix ) 01/19/2021, 02/07/2022     7.   Glucose/Cholesterol:  Prediabetes with most recent A1C down to normal at 5.6% with use of Zepbound .  Cholesterol almost at goal with Rosuvastatin .  HDL slightly low   Lipid Panel     Component Value Date/Time   CHOL 123 01/09/2024 1044   TRIG 50 01/09/2024 1044   HDL 41 01/09/2024 1044   CHOLHDL 3 05/07/2013 1057   VLDL 6.0 05/07/2013 1057   LDLCALC 70 01/09/2024 1044   LABVLDL 12 01/09/2024 1044     Current Meds  Medication Sig   acetaminophen  (TYLENOL ) 500 MG tablet Take 1,000 mg by mouth 2 (two) times daily as needed for moderate pain or headache.   amLODipine  (NORVASC ) 5 MG tablet Take 1 tablet (5 mg total) by mouth daily.   aspirin  EC 81 MG tablet Take 81 mg by mouth daily.   carvedilol  (COREG ) 12.5 MG tablet Take 1 tablet (12.5 mg total) by mouth 2 (two) times daily.   losartan  (COZAAR ) 100 MG tablet Take 1 tablet (100 mg total) by mouth daily.   Multiple Vitamin (MULTIVITAMIN WITH MINERALS) TABS tablet Take 1 tablet by mouth daily.   Multiple Vitamins-Minerals (HAIR SKIN AND NAILS FORMULA) TABS Take 1 tablet by mouth daily.   rosuvastatin  (CRESTOR ) 10 MG tablet Take 1 tablet (10 mg total) by mouth daily.   tirzepatide  7.5 MG/0.5ML injection vial Inject 7.5 mg into the skin once a week.   tiZANidine  (ZANAFLEX ) 4 MG tablet Take 4 mg by mouth every 8 (eight) hours as needed for muscle spasms.  Wheat Dextrin (BENEFIBER) POWD Take 1 packet by mouth at bedtime as needed.   Allergies  Allergen Reactions   Lisinopril  Other (See Comments)    Dry cough   Vicodin [Hydrocodone -Acetaminophen ] Nausea Only   Past Medical History:  Diagnosis Date   Anxiety    Ascending aortic aneurysm 01/2013   3.5-4 cm noted on CT-A  Followed by Cardiology   Bipolar II disorder, most recent episode major depressive (HCC)    Followed at [facility].  Darice Sciara, counselor:  [phone]   DJD (degenerative joint disease) of knee 01/02/2017   Dysrhythmia    irregular (02/17/2013)   Family  history of breast cancer    Family history of prostate cancer    Headache(784.0)    probably once/week (02/17/2013)   Hyperlipidemia    Hypertension    Insomnia    Left ovarian cyst 01/2013   Incidental CT finding   Migraine    probably once/week (02/17/2013)   Morbid obesity (HCC) 01/02/2017   Normocytic anemia    NSTEMI (non-ST elevated myocardial infarction) (HCC) 02/16/2013   Takotsubo cardiomyopathy, normal cors / Echocardiogram 4/22: EF 60-65, no RWMA, Gr 1 DD, GLS -22.1%, normal RVSF, RVSP 34.8, trivial MR, ascending aorta 39 mm   Obesity    OCD (obsessive compulsive disorder)    Plantar fasciitis of right foot    PTSD (post-traumatic stress disorder)    Takotsubo cardiomyopathy 01/2013   a. 2D echo 02/17/13: EF 30-35%, periapical AK, normal RV size/function, moderate pulmonary HTN. c/w Takotsubo CM. ;  b.  Echo (04/2013): EF 55%, no WMA, Gr 1 DD, mildly dilated ascending aorta (39 mm), mild MR, mild BAE, PASP 33   Trichomonas vaginitis 10/2014   Past Surgical History:  Procedure Laterality Date   BRACHIOPLASTY Bilateral 05/2011   Plastic Surgery to remove loose skin after Weight Loss   CARDIAC CATHETERIZATION  02/17/2013   no angiographic evidence of CAD, EF 35%, HK or the anteroapical wall, apex and inferoapical wall   FOOT SURGERY Right    KNEE ARTHROSCOPY Right 02/05/2017   [facility]   LEFT HEART CATHETERIZATION WITH CORONARY ANGIOGRAM N/A 02/17/2013   Procedure: LEFT HEART CATHETERIZATION WITH CORONARY ANGIOGRAM;  Surgeon: Lonni JONETTA Cash, MD;  Location: [facility] CATH LAB;  Service: Cardiovascular;  Laterality: N/A;   MASS EXCISION Right 02/29/2016   Procedure: EXCISION MASS, right wrist;  Surgeon: Franky Curia, MD;  Location: [facility];  Service: Orthopedics;  Laterality: Right;  EXCISION MASS, right wrist   TOTAL KNEE ARTHROPLASTY Left 03/10/2017   Procedure: TOTAL KNEE ARTHROPLASTY;  Surgeon: Yvone Rush, MD;  Location: [facility] OR;  Service:  Orthopedics;  Laterality: Left;   TUBAL LIGATION  1989   Family History  Problem Relation Age of Onset   Arthritis Mother        Rheumatoid   Breast cancer Mother 25       Recurrence at 5 in opposite breast; recurrence 2023 under arm   Depression Sister    Obesity Daughter        Gastric sleeve, now thin   Breast cancer Maternal Aunt 45   Breast cancer Maternal Aunt 55   Prostate cancer Maternal Uncle 81   Prostate cancer Maternal Uncle 81   Heart attack Maternal Grandmother    Other Maternal Grandfather 70       old age   Diabetes Paternal Grandmother    Stroke Paternal Grandmother    Breast cancer Cousin 13  Daughter of maternal aunt with breast cancer   Breast cancer Cousin 1   Breast cancer Half-Sister 74       Breast cancer   Social History   Socioeconomic History   Marital status: Legally Separated    Spouse name: Not on file   Number of children: 2   Years of education: Not on file   Highest education level: Bachelor's degree (e.g., BA, AB, BS)  Occupational History   Occupation: unemployed/disability  Tobacco Use   Smoking status: Never    Passive exposure: Never   Smokeless tobacco: Never  Vaping Use   Vaping status: Never Used  Substance and Sexual Activity   Alcohol use: No   Drug use: No   Sexual activity: Not Currently  Other Topics Concern   Not on file  Social History Narrative   Lives alone with her dog, Nellie   Prior to health issues, 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[employer]   Social Drivers of Health   Financial Resource Strain: Low Risk  (01/15/2024)   Overall Financial Resource Strain (CARDIA)    Difficulty of Paying Living Expenses: Not hard at all  Food Insecurity: No Food Insecurity (01/15/2024)   Hunger Vital Sign    Worried About Running Out of Food in the Last Year: Never true    Ran Out of Food in the Last Year: Never true  Transportation Needs: No Transportation Needs (01/15/2024)   PRAPARE -  Administrator, Civil Service (Medical): No    Lack of Transportation (Non-Medical): No  Physical Activity: Not on file  Stress: Not on file  Social Connections: Not on file  Intimate Partner Violence: Not At Risk (01/15/2024)   Humiliation, Afraid, Rape, and Kick questionnaire    Fear of Current or Ex-Partner: No    Emotionally Abused: No    Physically Abused: No    Sexually Abused: No     Review of Systems  HENT:  Negative for dental problem (Followed by Dr. Landy, [address]  Does have partials).   Eyes:  Negative for visual disturbance ([employer]--goes every 2 years.).  Respiratory:  Negative for shortness of breath.   Cardiovascular:  Negative for chest pain, palpitations and leg swelling.  Gastrointestinal:  Positive for blood in stool (When strains, she will have BRBPR on tissue.  Rare.  Not in stool or toilet water.  No melena) and constipation (Occasionally--leads to the straining.). Negative for abdominal pain.  Genitourinary:  Negative for vaginal bleeding.  Neurological:  Negative for weakness and numbness.  Psychiatric/Behavioral:  Negative for dysphoric mood. The patient is not nervous/anxious.       Objective:   BP 122/78 (BP Location: Right Arm, Patient Position: Sitting, Cuff Size: Normal)   Pulse 72   Resp 16   Ht 5' 6 (1.676 m)   Wt 212 lb 8 oz (96.4 kg)   LMP 03/16/2017   BMI 34.30 kg/m   Physical Exam Constitutional:      Appearance: She is obese.  HENT:     Head: Normocephalic and atraumatic.     Right Ear: Tympanic membrane, ear canal and external ear normal.     Left Ear: Tympanic membrane, ear canal and external ear normal.     Ears:     Comments: Left canal with cerumen, but open to TM    Nose: Nose normal.     Mouth/Throat:     Mouth: Mucous membranes are moist.  Pharynx: Oropharynx is clear.  Eyes:     Extraocular Movements: Extraocular movements intact.     Conjunctiva/sclera: Conjunctivae normal.     Pupils: Pupils  are equal, round, and reactive to light.  Neck:     Thyroid : No thyroid  mass or thyromegaly.  Cardiovascular:     Rate and Rhythm: Normal rate and regular rhythm.     Heart sounds: S1 normal and S2 normal. No murmur heard.    No friction rub. No S3 or S4 sounds.     Comments: No carotid bruits.  Carotid, radial, femoral, DP and PT pulses normal and equal.   Pulmonary:     Effort: Pulmonary effort is normal.     Breath sounds: Normal breath sounds and air entry.  Chest:  Breasts:    Right: No inverted nipple, mass or nipple discharge.     Left: No inverted nipple, mass or nipple discharge.  Abdominal:     General: Bowel sounds are normal.     Palpations: Abdomen is soft. There is no hepatomegaly, splenomegaly or mass.     Tenderness: There is no abdominal tenderness.     Hernia: No hernia is present.  Genitourinary:    General: Normal vulva.     Comments: No cervical or vaginal mucosal lesion No uterine or adnexal mass or tenderness Musculoskeletal:        General: Normal range of motion.     Cervical back: Normal range of motion and neck supple.     Right lower leg: No edema.     Left lower leg: No edema.     Comments: Save for right knee--decreased flexion ROM.  Bilateral TKR scars with both knees  Lymphadenopathy:     Head:     Right side of head: No submental or submandibular adenopathy.     Left side of head: No submental or submandibular adenopathy.     Cervical: No cervical adenopathy.     Upper Body:     Right upper body: No supraclavicular or axillary adenopathy.     Left upper body: No supraclavicular or axillary adenopathy.     Lower Body: No right inguinal adenopathy. No left inguinal adenopathy.  Skin:    General: Skin is warm.     Capillary Refill: Capillary refill takes less than 2 seconds.     Findings: Lesion (Back with seborrheic keratoses scattered over most of surface) present.  Neurological:     General: No focal deficit present.     Mental Status:  She is alert and oriented to person, place, and time.     Cranial Nerves: Cranial nerves 2-12 are intact.     Sensory: Sensation is intact.     Motor: Motor function is intact.     Coordination: Coordination is intact.     Gait: Gait is intact.     Deep Tendon Reflexes: Reflexes are normal and symmetric.  Psychiatric:        Mood and Affect: Mood normal.        Speech: Speech normal.        Behavior: Behavior normal. Behavior is cooperative.      Assessment & Plan   CPE with pap Mammogram complete FIT to return in 2 weeks. Spikevax  COVID vaccination  2.  Prediabetes and obesity:  Good weight loss with increased dose of Zepbound .  Follow up weight in 1 month.  A1C now normalized.  3.  Hyperlipidemia:  Continue lifestyle changes and look for good fats to replace bad  in diet.    4.  Right knee limitation with ROM:  encouraged her to ask Ortho about PT.  Also to use recumbent bike and gradually decrease distance to pedals to see if can get more flexion with knee.    5.  Hypertension:  remains controlled without hydrochlorothiazide .  Follow.    6.  Normocytic anemia:  FIT and to return for iro.  n studies, B12, RBC folate.    7.  Itching on back from Seborrheic Keratoses.  Loratadine  10 mg daily as needed.

## 2024-01-19 LAB — CYTOLOGY - PAP

## 2024-01-20 ENCOUNTER — Ambulatory Visit: Payer: Self-pay | Admitting: Internal Medicine

## 2024-01-23 ENCOUNTER — Other Ambulatory Visit (INDEPENDENT_AMBULATORY_CARE_PROVIDER_SITE_OTHER)

## 2024-01-23 DIAGNOSIS — D649 Anemia, unspecified: Secondary | ICD-10-CM | POA: Diagnosis not present

## 2024-01-26 LAB — IRON AND TIBC
Iron Saturation: 16 % (ref 15–55)
Iron: 33 ug/dL (ref 27–139)
Total Iron Binding Capacity: 204 ug/dL — ABNORMAL LOW (ref 250–450)
UIBC: 171 ug/dL (ref 118–369)

## 2024-01-26 LAB — VITAMIN B12: Vitamin B-12: 1200 pg/mL (ref 232–1245)

## 2024-01-26 LAB — FOLATE RBC
Folate, Hemolysate: 510 ng/mL
Folate, RBC: 1453 ng/mL (ref >498)
Hematocrit: 35.1 % (ref 34.0–46.6)

## 2024-02-12 ENCOUNTER — Other Ambulatory Visit: Admitting: Internal Medicine

## 2024-02-12 VITALS — Wt 198.0 lb

## 2024-02-12 DIAGNOSIS — E66812 Obesity, class 2: Secondary | ICD-10-CM | POA: Diagnosis not present

## 2024-02-12 DIAGNOSIS — Z6837 Body mass index (BMI) 37.0-37.9, adult: Secondary | ICD-10-CM

## 2024-02-13 NOTE — Progress Notes (Signed)
 See Dr. Telford note  Weight down 14.5 lbs from end of October. Would like for her to follow up for weight check in 2 weeks

## 2024-02-13 NOTE — Progress Notes (Cosign Needed)
   Subjective:  Ancillary Staff Note- Dietary assessment and wt check I, MD in training, under Dr. CHRISTELLA, did wt check and quick dietary survey.  Discussed with Dr. Adella after pt had left per protocol.  Note sent to Dr. CHRISTELLA for hassel.  Patient ID: Deborah Williamson, female    DOB: [birth year], 63 y.o.   MRN: [id number] CC weight check  HPI Seen yesterday.   Week 7 of Tirzepitide 7.5mg  q Wed.  Side effects: slight nausea and dry mouth. No taste for her salads,Occasional diarrhea, some tiredness. Says side- effect is tolerable.  Says every 2 months she hears food noises , meaning she starts wanting to eat and Dr. CHRISTELLA increases her dose.  Says it is about time for another increase.  Nutrition- not eating salads. Takes a multi a day. Thinks she must eat 100 g of protein a day; got it off the internet. Little source of Calcium  and Vit D except for a yogurt a day.  Drinks 3-4 16 oz bottles of water a day.        Objective:   Physical Exam Alert, NAD, cheerful, ambualtory Wt 198 pounds today 212.5 on 01/15/24     Assessment & Plan:  A/P 1- Obesity- Discussed with Dr. CHRISTELLA -I called pt to keep Tirzepitide at this dose and not increase it.  She at first was not happy about the no increase  but after further discussion, she was OK with it. -  Quick dietary advice when she was here and over the phone the next day: She was advised that it is recommended that she take 1200 mg of Calcium  per day and Vit D 400 U bid.  If she can add more foods with calcium , we can dec. Calcium . She can continue her Vit C.  There was not enough calcium  and vit D in Multi and it did not contain many vitamins. So she was asked to DC the Multi. - 2 Carrots/day, orange bell peppers  instead of vit A that was part of her Multi.  - Vit E that was part of her muli- she eats 1 egg a day and if she finds cheap vit E, she can go ahead and add it as a supplement. -Already eating veggies. - Protein she can do 30-50 g a day. 100 mg  might be too much - continue with electrolytes and water -Add good fat like avocado 1 a day or 1/4 to 1/2 cup a day for now, uncooked olive oil -avoid processed foods. -avoid starches.  - If she needs more help, she can make appt for one on one dietary education or go to group meet   Pt said she would work on it.  RTC in 2 weeks for wt check

## 2024-03-09 ENCOUNTER — Other Ambulatory Visit: Payer: Self-pay | Admitting: Physician Assistant

## 2024-04-09 ENCOUNTER — Other Ambulatory Visit: Payer: Self-pay | Admitting: Cardiovascular Disease

## 2024-04-14 ENCOUNTER — Other Ambulatory Visit (INDEPENDENT_AMBULATORY_CARE_PROVIDER_SITE_OTHER)

## 2024-04-14 ENCOUNTER — Encounter: Payer: Self-pay | Admitting: Internal Medicine

## 2024-04-14 VITALS — Wt 178.0 lb

## 2024-04-14 DIAGNOSIS — E66812 Obesity, class 2: Secondary | ICD-10-CM | POA: Diagnosis not present

## 2024-04-14 DIAGNOSIS — Z6837 Body mass index (BMI) 37.0-37.9, adult: Secondary | ICD-10-CM | POA: Diagnosis not present

## 2024-04-14 DIAGNOSIS — R7303 Prediabetes: Secondary | ICD-10-CM

## 2024-04-14 NOTE — Progress Notes (Signed)
 See Dr. Telford note Has lost 20 lbs in past 2 months and lost 14.5 lbs prior on current dosing of 7.5 mg Zepbound  Attempted to call and discuss, but could only leave message.   Will try again tomorrow  Called evening of 04/16/2023 and spoke with patient:  concerned about increasing her dose of Zepbound  with her losing 20 lbs over past 2 months.   She has made significant lifestyle changes as well, but feels hunger pains and states it is a struggle to stay on course with her current eating habits. She was agreeable to following up from home with weights and deciding on a weekly basis whether her dose should be increased.   No change in dose of Zepbound  for now.

## 2024-04-14 NOTE — Progress Notes (Cosign Needed)
 Chief Complaint  Patient presents with   Obesity    Weight loss plan using Zepound; here for weight check    Wt today 178 pounds Weight 2 months ago 198 pounds  On Zepound 7.5 mg since 12/16/23; week 16  Pt says her hunger noises are getting louder and she is having a lot of distress staying off food.  Would like the dose upped until she reaches her goal of 168 pounds.  She was doing well increasing dose by 2.5 mg q 2 months; last time it was not increased as she had expected. Feels anxious that the dose would not be increased again. Says she is paying too  much out of pocket and would like it if the doc increases it to make it easier for her.   She says after she reaches her goal of 168 pounds, she wants to discuss maintenance dose of med with Dr Adella.  -------------------------------------------------------------------------------------------------------------------------- Nutrition Note- Has been on a no meat fast with her church for the month of January.  Have been eating soy. Diet deficit noted: 1 cup of milk a day.  Good fats none. Vitamin D unclear.  Fresh veggies - some but could improve. Protein amount unclear. Takes A multi-vitamin called Nature's made.   Pt was asked to increase protein with each meal.  Takes protein shake in am. She was advised that she can do lean meat size of a playing card lunch and dinner  Do some avocado, uncooked olive oil,or 1/2 cup nuts every day Increase milk to 3 cups a day. Will have to ask about her veggies and vit D and look into her vitamins again next opportunity. She has not been able to follow last advise. She can decrease one of the servings of meat once she gets all the milk in.

## 2024-04-21 ENCOUNTER — Telehealth: Payer: Self-pay | Admitting: Cardiovascular Disease

## 2024-04-21 ENCOUNTER — Encounter: Payer: Self-pay | Admitting: Cardiovascular Disease

## 2024-04-21 NOTE — Telephone Encounter (Signed)
" °*  STAT* If patient is at the pharmacy, call can be transferred to refill team.   1. Which medications need to be refilled? (please list name of each medication and dose if known)   rosuvastatin  (CRESTOR ) 10 MG tablet  amLODipine  (NORVASC ) 5 MG tablet   carvedilol  (COREG ) 12.5 MG tablet    losartan  (COZAAR ) 100 MG tablet    2. Would you like to learn more about the convenience, safety, & potential cost savings by using the [facility]? No      3. Are you open to using the [facility] (Type [facility]. No    4. Which pharmacy/location (including street and city if local pharmacy) is medication to be sent to?[facility] - [place], [place] - [address]    5. Do they need a 30 day or 90 day supply? 90 day   Pt has appt scheduled 2/27  "

## 2024-04-21 NOTE — Telephone Encounter (Signed)
 Error

## 2024-04-22 ENCOUNTER — Telehealth: Payer: Self-pay | Admitting: Internal Medicine

## 2024-04-22 MED ORDER — CARVEDILOL 12.5 MG PO TABS: 12.5000 mg | ORAL_TABLET | Freq: Two times a day (BID) | ORAL | 0 refills | Status: AC

## 2024-04-22 MED ORDER — LOSARTAN POTASSIUM 100 MG PO TABS: 100.0000 mg | ORAL_TABLET | Freq: Every day | ORAL | 0 refills | Status: AC

## 2024-04-22 MED ORDER — AMLODIPINE BESYLATE 5 MG PO TABS: 5.0000 mg | ORAL_TABLET | Freq: Every day | ORAL | 0 refills | Status: AC

## 2024-04-22 MED ORDER — ROSUVASTATIN CALCIUM 10 MG PO TABS: 10.0000 mg | ORAL_TABLET | Freq: Every day | ORAL | 0 refills | Status: AC

## 2024-04-22 NOTE — Telephone Encounter (Signed)
 Pt scheduled 05/21/24, refills sent for a 30 day supply.

## 2024-04-22 NOTE — Telephone Encounter (Signed)
 Patient called today and states she spoke with Doctor last week because she wanted to increase Zepbound  dosage but was told to wait a week and see if it can be increased weekly.  Patient reports that Zepbound  does need to be increased to 10mg  , patient states she is having more appetite and she noticed today when she weight herself that she gained two pounds.  Patient states she has been with 7.5mg  for four months .

## 2024-04-23 MED ORDER — ZEPBOUND 10 MG/0.5ML ~~LOC~~ SOAJ: 10.0000 mg | SUBCUTANEOUS | 11 refills | Status: DC

## 2024-04-24 MED ORDER — TIRZEPATIDE-WEIGHT MANAGEMENT 10 MG/0.5ML ~~LOC~~ SOLN: 10.0000 mg | SUBCUTANEOUS | 11 refills | Status: AC

## 2024-04-29 ENCOUNTER — Telehealth: Payer: Self-pay | Admitting: Internal Medicine

## 2024-04-29 NOTE — Telephone Encounter (Signed)
 Received a engineer, technical sales from [place] from [employer] powered by [employer] .   Voicemail states they received a rejected prescription for patient and they would like to assist having prescription fixed so that patient can get her medication.   Number to call back [phone] and let the representative know we are calling to do a verbal script.

## 2024-05-21 ENCOUNTER — Ambulatory Visit: Admitting: Emergency Medicine

## 2024-07-16 ENCOUNTER — Other Ambulatory Visit

## 2024-07-20 ENCOUNTER — Ambulatory Visit: Admitting: Internal Medicine

## 2025-01-14 ENCOUNTER — Other Ambulatory Visit: Admitting: Internal Medicine

## 2025-01-18 ENCOUNTER — Encounter: Admitting: Internal Medicine
